# Patient Record
Sex: Female | Born: 1935
Health system: Southern US, Community
[De-identification: ages and names within clinical notes are randomized; demographics above are authoritative.]

## PROBLEM LIST (undated history)

## (undated) DIAGNOSIS — Z923 Personal history of irradiation: Secondary | ICD-10-CM

## (undated) DIAGNOSIS — J302 Other seasonal allergic rhinitis: Secondary | ICD-10-CM

## (undated) DIAGNOSIS — T7840XA Allergy, unspecified, initial encounter: Secondary | ICD-10-CM

## (undated) DIAGNOSIS — R32 Unspecified urinary incontinence: Secondary | ICD-10-CM

## (undated) DIAGNOSIS — E079 Disorder of thyroid, unspecified: Secondary | ICD-10-CM

## (undated) DIAGNOSIS — F419 Anxiety disorder, unspecified: Secondary | ICD-10-CM

## (undated) DIAGNOSIS — C50919 Malignant neoplasm of unspecified site of unspecified female breast: Secondary | ICD-10-CM

## (undated) DIAGNOSIS — L719 Rosacea, unspecified: Secondary | ICD-10-CM

## (undated) DIAGNOSIS — I10 Essential (primary) hypertension: Secondary | ICD-10-CM

## (undated) DIAGNOSIS — Z460 Encounter for fitting and adjustment of spectacles and contact lenses: Secondary | ICD-10-CM

## (undated) DIAGNOSIS — Z780 Asymptomatic menopausal state: Secondary | ICD-10-CM

## (undated) DIAGNOSIS — I499 Cardiac arrhythmia, unspecified: Secondary | ICD-10-CM

## (undated) DIAGNOSIS — I4891 Unspecified atrial fibrillation: Secondary | ICD-10-CM

## (undated) DIAGNOSIS — M1990 Unspecified osteoarthritis, unspecified site: Secondary | ICD-10-CM

## (undated) DIAGNOSIS — G479 Sleep disorder, unspecified: Secondary | ICD-10-CM

## (undated) HISTORY — PX: EYE SURGERY: SHX253

## (undated) HISTORY — DX: Unspecified atrial fibrillation: I48.91

## (undated) HISTORY — DX: Allergy, unspecified, initial encounter: T78.40XA

## (undated) HISTORY — DX: Essential (primary) hypertension: I10

## (undated) HISTORY — DX: Asymptomatic menopausal state: Z78.0

## (undated) HISTORY — DX: Cardiac arrhythmia, unspecified: I49.9

## (undated) HISTORY — DX: Disorder of thyroid, unspecified: E07.9

## (undated) HISTORY — PX: BACK SURGERY: SHX140

## (undated) HISTORY — PX: BILATERAL OOPHORECTOMY: SHX1221

## (undated) HISTORY — PX: TONSILLECTOMY: SUR1361

## (undated) HISTORY — DX: Sleep disorder, unspecified: G47.9

## (undated) HISTORY — DX: Rosacea, unspecified: L71.9

## (undated) HISTORY — DX: Unspecified urinary incontinence: R32

## (undated) HISTORY — DX: Personal history of irradiation: Z92.3

---
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
## 1971-12-04 HISTORY — PX: ABDOMINAL HYSTERECTOMY: SHX81

## 1998-02-03 ENCOUNTER — Ambulatory Visit (HOSPITAL_COMMUNITY): Admission: RE | Admit: 1998-02-03 | Discharge: 1998-02-03 | Payer: Self-pay | Admitting: Family Medicine

## 1999-02-08 ENCOUNTER — Encounter: Admission: RE | Admit: 1999-02-08 | Discharge: 1999-05-09 | Payer: Self-pay | Admitting: Family Medicine

## 1999-03-15 ENCOUNTER — Encounter: Admission: RE | Admit: 1999-03-15 | Discharge: 1999-06-13 | Payer: Self-pay | Admitting: Orthopedic Surgery

## 1999-06-08 ENCOUNTER — Ambulatory Visit (HOSPITAL_COMMUNITY): Admission: RE | Admit: 1999-06-08 | Discharge: 1999-06-08 | Payer: Self-pay | Admitting: Obstetrics and Gynecology

## 1999-06-08 ENCOUNTER — Encounter: Payer: Self-pay | Admitting: Obstetrics and Gynecology

## 2001-12-22 ENCOUNTER — Encounter: Payer: Self-pay | Admitting: Gastroenterology

## 2001-12-22 ENCOUNTER — Ambulatory Visit (HOSPITAL_COMMUNITY): Admission: RE | Admit: 2001-12-22 | Discharge: 2001-12-22 | Payer: Self-pay | Admitting: Gastroenterology

## 2003-03-16 ENCOUNTER — Encounter: Payer: Self-pay | Admitting: Internal Medicine

## 2003-03-16 ENCOUNTER — Encounter: Admission: RE | Admit: 2003-03-16 | Discharge: 2003-03-16 | Payer: Self-pay | Admitting: Internal Medicine

## 2003-03-30 ENCOUNTER — Encounter: Admission: RE | Admit: 2003-03-30 | Discharge: 2003-04-13 | Payer: Self-pay | Admitting: Internal Medicine

## 2003-05-19 ENCOUNTER — Encounter: Payer: Self-pay | Admitting: Emergency Medicine

## 2003-05-19 ENCOUNTER — Observation Stay (HOSPITAL_COMMUNITY): Admission: EM | Admit: 2003-05-19 | Discharge: 2003-05-20 | Payer: Self-pay | Admitting: Emergency Medicine

## 2003-11-25 ENCOUNTER — Emergency Department (HOSPITAL_COMMUNITY): Admission: EM | Admit: 2003-11-25 | Discharge: 2003-11-25 | Payer: Self-pay | Admitting: Emergency Medicine

## 2004-07-28 ENCOUNTER — Encounter: Admission: RE | Admit: 2004-07-28 | Discharge: 2004-07-28 | Payer: Self-pay | Admitting: Internal Medicine

## 2004-08-03 ENCOUNTER — Encounter (HOSPITAL_COMMUNITY): Admission: RE | Admit: 2004-08-03 | Discharge: 2004-11-01 | Payer: Self-pay | Admitting: Internal Medicine

## 2004-09-06 ENCOUNTER — Ambulatory Visit (HOSPITAL_COMMUNITY): Admission: RE | Admit: 2004-09-06 | Discharge: 2004-09-06 | Payer: Self-pay | Admitting: Endocrinology

## 2004-09-06 ENCOUNTER — Encounter (INDEPENDENT_AMBULATORY_CARE_PROVIDER_SITE_OTHER): Payer: Self-pay | Admitting: *Deleted

## 2005-02-06 ENCOUNTER — Encounter
Admission: RE | Admit: 2005-02-06 | Discharge: 2005-03-20 | Payer: Self-pay | Admitting: Physical Medicine and Rehabilitation

## 2005-05-22 ENCOUNTER — Inpatient Hospital Stay (HOSPITAL_COMMUNITY): Admission: RE | Admit: 2005-05-22 | Discharge: 2005-05-24 | Payer: Self-pay | Admitting: Neurosurgery

## 2005-07-04 ENCOUNTER — Encounter: Admission: RE | Admit: 2005-07-04 | Discharge: 2005-07-04 | Payer: Self-pay | Admitting: Neurosurgery

## 2005-09-05 ENCOUNTER — Encounter: Admission: RE | Admit: 2005-09-05 | Discharge: 2005-09-05 | Payer: Self-pay | Admitting: Neurosurgery

## 2005-09-11 ENCOUNTER — Encounter: Admission: RE | Admit: 2005-09-11 | Discharge: 2005-11-08 | Payer: Self-pay | Admitting: Neurosurgery

## 2005-10-05 ENCOUNTER — Encounter: Admission: RE | Admit: 2005-10-05 | Discharge: 2005-10-05 | Payer: Self-pay | Admitting: Orthopedic Surgery

## 2005-10-18 ENCOUNTER — Ambulatory Visit: Payer: Self-pay | Admitting: Infectious Diseases

## 2005-10-31 ENCOUNTER — Ambulatory Visit: Payer: Self-pay | Admitting: Infectious Diseases

## 2006-12-03 HISTORY — PX: RECTOCELE REPAIR: SHX761

## 2006-12-03 HISTORY — PX: CYSTOCELE REPAIR: SHX163

## 2007-02-11 ENCOUNTER — Ambulatory Visit (HOSPITAL_COMMUNITY): Admission: RE | Admit: 2007-02-11 | Discharge: 2007-02-12 | Payer: Self-pay | Admitting: Urology

## 2007-11-11 ENCOUNTER — Encounter: Admission: RE | Admit: 2007-11-11 | Discharge: 2007-12-03 | Payer: Self-pay | Admitting: Family Medicine

## 2007-12-08 ENCOUNTER — Encounter: Admission: RE | Admit: 2007-12-08 | Discharge: 2007-12-10 | Payer: Self-pay | Admitting: Family Medicine

## 2010-12-24 ENCOUNTER — Encounter: Payer: Self-pay | Admitting: Internal Medicine

## 2011-04-20 NOTE — Op Note (Signed)
[name], [name]               ACCOUNT NO.:  [id number]   MEDICAL RECORD NO.:  [id number]          PATIENT TYPE:  AMB   LOCATION:  DAY                          FACILITY:  [facility]   PHYSICIAN:  Martina Sinner, MD DATE OF BIRTH:  [birth year]   DATE OF PROCEDURE:  02/11/2007  DATE OF DISCHARGE:                               OPERATIVE REPORT   PREOPERATIVE DIAGNOSIS:  Vault prolapse, cystocele, rectocele.   POSTOPERATIVE DIAGNOSIS:  Vault prolapse, cystocele, rectocele.   SURGERY:  1. Vault prolapse repair  2. Cystocele repair utilizing dermal graft.  3. Rectocele repair.  4. Cystoscopy.  5. Perineal repair.   SURGEON:  Martina Sinner, MD   ASSISTANTS:  Jamison Neighbor, M.D. and Cornelious Bryant, MD   DESCRIPTION OF PROCEDURE:  Katherine Powell has a symptomatic cystocele and  distal rectocele with splaying  of the posterior fourchette.   The patient was prepped and draped in the usual fashion.  Extra care was  taken to minimize the risk of compartment syndrome, neuropathy and DVT  when I positioned her legs.  She was given preoperative antibiotics.  Her preoperative laboratory work was normal.   When I examined her there was no question that the dimples at the  vaginal cuff that could be brought down just to the introitus.  These  were marked with two 3-0 Vicryl suture.  I then made an anterior vaginal  wall incision after instilling approximately 25 mL of 1% lidocaine with  epinephrine all the way back to the lateral sidewall.  Between Allis  clamps I made my T-shape incision and sharply dissected the pubocervical  fascia from the vaginal epithelium back to the sidewall.  I then did a  two-layer anterior repair with running 2-0 Vicryl.  I then cystoscoped  the patient; and there was efflux of indigo carmine from both ureteral  orifices.  The ureteral orifices were in their normal position.   After doing the two-layer repair, I dissected sharply back to the white  line  bilaterally.  I did more sharp dissection cephalad so I could break  through the endopelvic fascia, and feel the ischial spine bilaterally.  I could feel the pelvic sidewall near the urethrovesical angle  bilaterally.  At the urethrovesical angle, we placed a 2-0 Vicryl on a  UR-5 needle strongly into the pelvic sidewall.  Using a Capio device I  placed a #0 Ethibond directly on the ischial spine.  I was very happy  with its position.  Then I took a 10 x 6 cm graft; and trimmed it in the  shape of a trapezoid in the usual fashion.  Using a Mayo needle, I  brought through all four sutures; and the graft laid in beautifully not  under tension.  I then trimmed approximately 1 cm of vaginal mucosa  bilaterally and closed with running 2-0 Vicryl nonlocking suture.  At  the end of the case the vaginal length was excellent.  One could feel  the graft as a nice large hammock underneath the bladder.  Blood loss  anteriorly was less than 10-20 mL.  I then did a posterior repair.  I placed two Allis clamps at the level  of the hymenal ring.  She had a lot of deficiency posteriorly with a  long perineum.  For this reason I made the triangle of perineal skin a  little bit larger than usual and removed the skin.  Then I made a  midline incision with my usual technique with Allis clamps as counter  traction.  There is no question that as I went up posteriorly, she had a  long posterior wall; and there was no defect in the upper third of the  vagina.  It was actually a little bit difficult to place the Allis since  there was no bulging.  I did not feel that extending the posterior  repair to the apex was in her best interest.  I, therefore, only  dissected two-thirds of the way up and took the rectovaginal fascia off  the vaginal epithelium all the way to the pelvic sidewall.  On a number  of occasions I double gloved, and did a rectal examination.  She had a  central defect with bulging of the perineal  body and distally she had  thinning of the rectovaginal mucosa with no obvious site defect.  It was  more of a generalized thinning.  A two-layer imbricating running closure  with 2-0 Vicryl was done; and there was nice reduction of the rectocele.  So I did a rectal exam.  There was no suture in the rectum, and no  distortion of the anatomy.   I trimmed 5 or 6 mm from the right side, but none from the left.  She  had minimal bleeding during the rectocele repair, but she was oozing, I  believe, from cutting the vaginal epithelium or taking things off  tension at the end of the case.  I closed with running 2-0 Vicryl  reapproximating the hymenal ring.  I then used three #0 Vicryl sutures  to gently reapproximate the perineal body.  I then brought the 2-0  Vicryl vaginal incision into the perineal incision, and did a  subcuticular closure burying the suture.  She had a little bit of  bleeding at the end, which was more of an annoyance, but I thought I  should pack her firmly.  She had a very long vagina with a lot of laxity  of her tissues, so I ended up placing in 3 vaginal packs with the third  shaped like a ball, placed distally, to give a nice gentle tension on  the area of the rectocele repair.  Leg position was good at the end of  the case.  Foley catheter was draining blue urine.   I was very pleased with Katherine Powell's surgery.  Hopefully this will reach  her treatment goal.           ______________________________  Martina Sinner, MD  Electronically Signed     SAM/MEDQ  D:  02/11/2007  T:  02/13/2007  Job:  [id number]

## 2011-04-20 NOTE — Procedures (Signed)
[facility]  Patient:    Katherine Powell, Katherine Powell Visit Number: [id number] MRN: [id number]          Service Type: END Location: ENDO Attending Physician:  Louie Bun Dictated by:   Everardo All Madilyn Fireman, M.D. Proc. Date: 12/22/01 Admit Date:  12/22/2001   CC:         Caryn Bee C. Sydnee Levans, M.D.   Procedure Report  PROCEDURE:  Colonoscopy.  INDICATION FOR PROCEDURE:  Screening colonoscopy in a 77 year old patient who has also had stable chronic right lower quadrant abdominal pain.  DESCRIPTION OF PROCEDURE:  The patient was placed in the left lateral decubitus position and placed on the pulse monitor with continuous low flow oxygen delivered by nasal cannula. She was sedated with 80 mg IV Demerol and 9 mg IV Versed. The Olympus video colonoscope was inserted into the rectum and inserted its entire length but despite multiple position changes, torquing maneuvers, abdominal pressure and the use of the variable stiffness scope at different settings, I was unable to reach the cecum. It was not certain but felt that the point of furthest penetration was probably somewhere in the proximal transverse colon. The visualized parts of the transverse colon as well as descending colon appeared normal with no masses, polyps, diverticula or other mucosal abnormalities. Within the sigmoid colon, there were seen a few scattered diverticula and no other abnormalities. The rectum appeared normal and retroflexed view of the anus revealed no obvious internal hemorrhoids. The colonoscope was then withdrawn and the patient returned to the recovery room in stable condition. The patient tolerated the procedure well and there were no immediate complications.  IMPRESSION:  1. Incomplete studies about the mid to proximal transverse colon.  2. Sigmoid diverticulosis.  PLAN:  Will obtain barium enema later today to image the right colon. Dictated by:   Everardo All Madilyn Fireman, M.D. Attending  Physician:  Louie Bun DD:  12/22/01 TD:  12/23/01 Job: [id number]

## 2011-12-04 HISTORY — PX: CATARACT EXTRACTION: SUR2

## 2013-08-25 ENCOUNTER — Other Ambulatory Visit: Payer: Self-pay | Admitting: Ophthalmology

## 2013-09-03 ENCOUNTER — Encounter (HOSPITAL_COMMUNITY): Payer: Self-pay

## 2013-09-08 ENCOUNTER — Ambulatory Visit (INDEPENDENT_AMBULATORY_CARE_PROVIDER_SITE_OTHER): Payer: Medicare Other | Admitting: Interventional Cardiology

## 2013-09-08 ENCOUNTER — Ambulatory Visit: Payer: Medicare Other | Admitting: Interventional Cardiology

## 2013-09-08 ENCOUNTER — Ambulatory Visit (HOSPITAL_COMMUNITY): Payer: Medicare Other | Attending: Cardiology | Admitting: Radiology

## 2013-09-08 ENCOUNTER — Other Ambulatory Visit (HOSPITAL_COMMUNITY): Payer: Self-pay | Admitting: Interventional Cardiology

## 2013-09-08 DIAGNOSIS — I4891 Unspecified atrial fibrillation: Secondary | ICD-10-CM | POA: Insufficient documentation

## 2013-09-08 DIAGNOSIS — I379 Nonrheumatic pulmonary valve disorder, unspecified: Secondary | ICD-10-CM | POA: Insufficient documentation

## 2013-09-08 DIAGNOSIS — Z79899 Other long term (current) drug therapy: Secondary | ICD-10-CM

## 2013-09-08 DIAGNOSIS — I079 Rheumatic tricuspid valve disease, unspecified: Secondary | ICD-10-CM | POA: Insufficient documentation

## 2013-09-08 DIAGNOSIS — I080 Rheumatic disorders of both mitral and aortic valves: Secondary | ICD-10-CM | POA: Insufficient documentation

## 2013-09-08 NOTE — Progress Notes (Deleted)
   Patient ID: Katherine Powell, female    DOB: 1932-08-03, 79 y.o.   MRN: [id number]  HPI    Review of Systems    Physical Exam

## 2013-09-08 NOTE — Progress Notes (Deleted)
   Patient ID: Katherine Powell, female    DOB: 05/19/1932, 79 y.o.   MRN: [id number]  HPI    Review of Systems    Physical Exam      

## 2013-09-08 NOTE — Progress Notes (Signed)
Echocardiogram performed.  

## 2013-09-08 NOTE — Progress Notes (Signed)
Patient ID: Katherine Powell, female   DOB: January 19, 1932, 79 y.o.   MRN: [id number]   EXERCISE TREADMILL TEST  INDICATION: Initiation of flecainide therapy for atrial fibrillation.  PROTOCOL: Bruce protocol  RESULTS: The patient exercised 6 minutes 33 seconds on the standard Bruce protocol. Resting blood pressure was normal at 128/70 mmHg. Heart rate increased to 122 beats per minute, which exceeded 85% of the predicted maximum heart rate. Peak blood pressure 178/60. There were no symptoms of angina, EKG changes of ischemia, or arrhythmias noted. Recovery was uneventful.  IMPRESSION: 1. No exercise-induced arrhythmias.  2. No evidence of ischemia.  3. Normal. Exercise tolerance

## 2013-09-08 NOTE — Progress Notes (Signed)
Exercise Treadmill Test  Pre-Exercise Testing Evaluation Rhythm: normal sinus  Rate: 53                 Test  Exercise Tolerance Test Ordering MD: Verdis Prime III, MD  Interpreting MD: Verdis Prime III, MD  Unique Test No: 1  Treadmill:  1  Indication for ETT: A FIB  Contraindication to ETT: No   Stress Modality: exercise - treadmill  Cardiac Imaging Performed: non   Protocol: standard Bruce - maximal  Max BP:  164/75  Max MPHR (bpm):  141 85% MPR (bpm):  120  MPHR obtained (bpm):  123 % MPHR obtained:  87  Reached 85% MPHR (min:sec):  6:24 Total Exercise Time (min-sec):  6:33  Workload in METS:  7.8 Borg Scale: 13  Reason ETT Terminated:  Target heart rate achieved    ST Segment Analysis At Rest: normal ST segments - no evidence of significant ST depression With Exercise: no evidence of significant ST depression  Other Information Arrhythmia:  No Angina during ETT:  absent (0) Quality of ETT:  diagnostic  ETT Interpretation:  normal - no evidence of ischemia by ST analysis  Comments: The treadmill test was performed after initiation of flecainide therapy. No exercise-induced arrhythmias were noted. Coincidentally, no evidence of ischemia was induced.  Recommendations: Continue therapy

## 2013-09-11 ENCOUNTER — Telehealth: Payer: Self-pay

## 2013-09-11 NOTE — Telephone Encounter (Signed)
Message copied by Jarvis Newcomer on Fri Sep 11, 2013  8:40 AM ------      Message from: Verdis Prime      Created: Wed Sep 09, 2013 12:50 PM       The echo was normal. The stress test was acceptable. We should continue the flecainide at the current dose. Needs appointment for her to see me in 3 months. She should call if symptoms become intolerable appear ------

## 2013-09-11 NOTE — Telephone Encounter (Signed)
lmom. The echo was normal. The stress test was acceptable. We should continue the flecainide at the current dose. Needs appointment for her to see me in 3 months. She should call if symptoms become intolerable.  Recall put  in for 3 mo f/u

## 2013-09-11 NOTE — Telephone Encounter (Signed)
Message copied by Jarvis Newcomer on Fri Sep 11, 2013  8:39 AM ------      Message from: Verdis Prime      Created: Wed Sep 09, 2013 12:50 PM       The echo was normal. The stress test was acceptable. We should continue the flecainide at the current dose. Needs appointment for her to see me in 3 months. She should call if symptoms become intolerable appear ------

## 2013-09-17 ENCOUNTER — Telehealth: Payer: Self-pay | Admitting: Interventional Cardiology

## 2013-09-17 NOTE — Telephone Encounter (Signed)
lmom for pt to return call. Dr.Smith instructs that the pt hold flecainide for 1 week if symptoms continue then resume taking.if symptoms subside, pt needs to know the only other alternative to controlling her afib is to be hospitalized for upload of tykosin.pt needs to determine whether she would be willing to tolerate symptoms

## 2013-09-17 NOTE — Telephone Encounter (Signed)
New message    On flecainide----causing dizziness, headache and causing her to be sleepy a lot

## 2013-09-18 ENCOUNTER — Telehealth: Payer: Self-pay | Admitting: Interventional Cardiology

## 2013-09-18 NOTE — Telephone Encounter (Signed)
Patient returning Lisa's call. Patient was told to speak with Triage.

## 2013-09-18 NOTE — Telephone Encounter (Signed)
I spoke with the pt and made her aware of Dr Michaelle Copas recommendations.  The pt will hold flecainide for one week to see if her symptoms change. The pt said she will think about whether she would like to start Tikosyn. The pt will call the office in one week.  I will forward this message to Misty Stanley to also contact the pt in 1 week.

## 2013-10-06 ENCOUNTER — Encounter: Payer: Self-pay | Admitting: Interventional Cardiology

## 2013-10-22 ENCOUNTER — Telehealth: Payer: Self-pay | Admitting: Interventional Cardiology

## 2013-10-22 NOTE — Telephone Encounter (Signed)
New problem    Patient need to stop coumadin 5 days prior to breast biopsy schedule on 12/2 .

## 2013-10-23 NOTE — Telephone Encounter (Signed)
LM for Shanda Bumps at [facility] and pt with Dr. Michaelle Copas instructions

## 2013-10-23 NOTE — Telephone Encounter (Signed)
This is okay and should be coordinated with the coumadin clinic.

## 2013-10-26 ENCOUNTER — Encounter (INDEPENDENT_AMBULATORY_CARE_PROVIDER_SITE_OTHER): Payer: Self-pay | Admitting: Surgery

## 2013-11-02 NOTE — Telephone Encounter (Signed)
Spoke with pt, she states she has cancelled the breast bx for 11/03/13.  Going for a second opinion first.  Pt WCB if and when reschedules bx for instructions on holding Coumadin.

## 2013-11-05 ENCOUNTER — Ambulatory Visit (INDEPENDENT_AMBULATORY_CARE_PROVIDER_SITE_OTHER): Payer: Medicare Other | Admitting: Surgery

## 2013-11-05 ENCOUNTER — Encounter (INDEPENDENT_AMBULATORY_CARE_PROVIDER_SITE_OTHER): Payer: Self-pay | Admitting: Surgery

## 2013-11-05 ENCOUNTER — Encounter (HOSPITAL_BASED_OUTPATIENT_CLINIC_OR_DEPARTMENT_OTHER): Payer: Self-pay | Admitting: *Deleted

## 2013-11-05 DIAGNOSIS — N6310 Unspecified lump in the right breast, unspecified quadrant: Secondary | ICD-10-CM | POA: Insufficient documentation

## 2013-11-05 DIAGNOSIS — N63 Unspecified lump in unspecified breast: Secondary | ICD-10-CM

## 2013-11-05 NOTE — Progress Notes (Signed)
Patient ID: Katherine Powell, female   DOB: 11-May-1932, 79 y.o.   MRN: [id number]  Chief Complaint  Patient presents with  . New Evaluation    eval breast mass    HPI Katherine Powell is a 79 y.o. female.   HPI She is referred by Dr. Duane Lope for evaluation of a right breast mass. This was found on screening mammography. She refused stereotactic biopsy and what to go ahead and proceed with removal of the mass. She cannot palpate the mass herself. She has no previous problems with her breasts. She denies nipple discharge. She is otherwise without complaints and his physically been doing well Past Medical History  Diagnosis Date  . Arrhythmia     atrial fibrillation/  on   . Hypertension   . Rosacea   . Atrial fibrillation   . Postmenopausal   . Sleep disturbance   . Thyroid disease   . Urinary incontinence     Past Surgical History  Procedure Laterality Date  . Abdominal hysterectomy    . Cystocele repair    . Rectocele repair    . Back surgery    . Appendectomy    . Tonsillectomy    . Cataract extraction    . Eye surgery      History reviewed. No pertinent family history.  Social History History  Substance Use Topics  . Smoking status: Never Smoker   . Smokeless tobacco: Not on file  . Alcohol Use: No    Allergies  Allergen Reactions  . Codeine     nausea    Current Outpatient Prescriptions  Medication Sig Dispense Refill  . acetaminophen (TYLENOL) 650 MG CR tablet Take 650 mg by mouth every 8 (eight) hours as needed for pain.      [name] ALPRAZolam (XANAX) 0.5 MG tablet Take 0.5 mg by mouth at bedtime as needed for anxiety.      . Azelaic Acid (FINACEA) 15 % cream Apply topically 2 (two) times daily. After skin is thoroughly washed and patted dry, gently but thoroughly massage a thin film of azelaic acid cream into the affected area twice daily, in the morning and evening.      . beta carotene w/minerals (OCUVITE) tablet Take 1 tablet by mouth daily.      . calcium  carbonate (OS-CAL) 600 MG TABS tablet Take 600 mg by mouth 2 (two) times daily with a meal.      . cycloSPORINE (RESTASIS) 0.05 % ophthalmic emulsion 1 drop 2 (two) times daily.      [name] diltiazem (CARDIZEM SR) 120 MG 12 hr capsule Take 120 mg by mouth 2 (two) times daily.      [name] estradiol (ESTRACE) 0.5 MG tablet       . fexofenadine (ALLEGRA) 180 MG tablet Take 180 mg by mouth daily.      . fluticasone (FLONASE) 50 MCG/ACT nasal spray Place into both nostrils daily.      [name] glucosamine-chondroitin 500-400 MG tablet Take 1 tablet by mouth 3 (three) times daily.      [name] guaifenesin (HUMIBID E) 400 MG TABS tablet Take 400 mg by mouth every 4 (four) hours.      [name] guaiFENesin (MUCINEX) 600 MG 12 hr tablet Take by mouth 2 (two) times daily.      . Melatonin 10 MG TABS Take by mouth.      . METRONIDAZOLE EX Apply topically.      . sodium chloride (OCEAN) 0.65 % nasal spray Place  1 spray into the nose as needed for congestion.      . trolamine salicylate (ASPERCREME) 10 % cream Apply 1 application topically as needed for muscle pain.      . vitamin E 400 UNIT capsule Take 400 Units by mouth daily.      [name] warfarin (COUMADIN) 5 MG tablet       . Coenzyme Q10 (CO Q-10) 300 MG CAPS Take by mouth.      . flecainide (TAMBOCOR) 50 MG tablet       . magnesium oxide (MAG-OX) 400 MG tablet Take 400 mg by mouth daily.      . Potassium Gluconate 595 MG TBCR Take by mouth.       No current facility-administered medications for this visit.    Review of Systems Review of Systems  Constitutional: Negative for fever, chills and unexpected weight change.  HENT: Negative for congestion, hearing loss, sore throat, trouble swallowing and voice change.   Eyes: Negative for visual disturbance.  Respiratory: Negative for cough and wheezing.   Cardiovascular: Negative for chest pain, palpitations and leg swelling.  Gastrointestinal: Negative for nausea, vomiting, abdominal pain, diarrhea, constipation, blood in stool,  abdominal distention and anal bleeding.  Genitourinary: Negative for hematuria, vaginal bleeding and difficulty urinating.  Musculoskeletal: Positive for arthralgias.  Skin: Negative for rash and wound.  Neurological: Negative for seizures, syncope and headaches.  Hematological: Negative for adenopathy. Does not bruise/bleed easily.  Psychiatric/Behavioral: Negative for confusion.    There were no vitals taken for this visit.  Physical Exam Physical Exam  Constitutional: She is oriented to person, place, and time. She appears well-developed and well-nourished. No distress.  HENT:  Head: Normocephalic and atraumatic.  Right Ear: External ear normal.  Left Ear: External ear normal.  Nose: Nose normal.  Mouth/Throat: Oropharynx is clear and moist. No oropharyngeal exudate.  Eyes: Conjunctivae are normal. Pupils are equal, round, and reactive to light. Right eye exhibits no discharge. Left eye exhibits no discharge. No scleral icterus.  Neck: Normal range of motion. Neck supple. No tracheal deviation present.  Cardiovascular: Normal rate, regular rhythm, normal heart sounds and intact distal pulses.   No murmur heard. Pulmonary/Chest: Effort normal and breath sounds normal. No respiratory distress. She has no wheezes. She has no rales.  Musculoskeletal: Normal range of motion. She exhibits no edema and no tenderness.  Lymphadenopathy:    She has no cervical adenopathy.    She has axillary adenopathy.  There is a slightly enlarged right axillary lymph node  Neurological: She is alert and oriented to person, place, and time.  Skin: Skin is warm and dry. No rash noted. She is not diaphoretic. No erythema.  Psychiatric: Her behavior is normal. Judgment normal.  Breasts: I cannot palpate a mass in the right breast specifically at the area of concern. The areola and nipple are normal  Data Reviewed Her mammogram demonstrates a 1 cm mass at the 12:00 position of the right breast with  indistinct margins  Assessment    Right breast mass     Plan    The mammogram is worrisome for potential malignancy. I discussed the reasons for a stereotactic biopsy. She just wants to go ahead and have the mass removed which I believe is reasonable. She understands that further surgery may be necessary if malignancy is found. She also understands this may be a benign lesion. This would have to be done with a needle localization. I discussed the risks of surgery which includes  but is not limited to bleeding, infection, injury to surround structures, need for further surgery should the margins be positive, cardiopulmonary issues given her age, Melvern Banker. She will have to stop her Coumadin 5 days preoperatively. Surgery will be scheduled        Normand Damron A 11/05/2013, 3:23 PM

## 2013-11-05 NOTE — Progress Notes (Signed)
Pt just added on today-to stop coumadin tonight-surgery is Monday-will need to come here 1200 noon for bmet-pt ptt-then [facility] 1230-then back here for surgery Had cardiac work up dr Katrinka Blazing 10/14-stress-echo-ekg-cleared for surgery No resp problems-occ allergies

## 2013-11-09 ENCOUNTER — Encounter (HOSPITAL_BASED_OUTPATIENT_CLINIC_OR_DEPARTMENT_OTHER): Payer: Self-pay

## 2013-11-09 ENCOUNTER — Ambulatory Visit (HOSPITAL_BASED_OUTPATIENT_CLINIC_OR_DEPARTMENT_OTHER): Payer: Medicare Other | Admitting: Anesthesiology

## 2013-11-09 ENCOUNTER — Ambulatory Visit (HOSPITAL_BASED_OUTPATIENT_CLINIC_OR_DEPARTMENT_OTHER)
Admission: RE | Admit: 2013-11-09 | Discharge: 2013-11-09 | Disposition: A | Discharge status: Home / Self Care | Payer: Medicare Other | Source: Ambulatory Visit | Attending: Surgery | Admitting: Surgery

## 2013-11-09 ENCOUNTER — Encounter (HOSPITAL_BASED_OUTPATIENT_CLINIC_OR_DEPARTMENT_OTHER): Payer: Medicare Other | Admitting: Anesthesiology

## 2013-11-09 ENCOUNTER — Encounter (HOSPITAL_BASED_OUTPATIENT_CLINIC_OR_DEPARTMENT_OTHER)
Admission: RE | Disposition: A | Discharge status: Home / Self Care | Payer: Self-pay | Source: Ambulatory Visit | Attending: Surgery

## 2013-11-09 DIAGNOSIS — Z7901 Long term (current) use of anticoagulants: Secondary | ICD-10-CM | POA: Insufficient documentation

## 2013-11-09 DIAGNOSIS — I10 Essential (primary) hypertension: Secondary | ICD-10-CM | POA: Insufficient documentation

## 2013-11-09 DIAGNOSIS — Z79899 Other long term (current) drug therapy: Secondary | ICD-10-CM | POA: Insufficient documentation

## 2013-11-09 DIAGNOSIS — E669 Obesity, unspecified: Secondary | ICD-10-CM | POA: Insufficient documentation

## 2013-11-09 DIAGNOSIS — C50919 Malignant neoplasm of unspecified site of unspecified female breast: Secondary | ICD-10-CM | POA: Insufficient documentation

## 2013-11-09 DIAGNOSIS — N6310 Unspecified lump in the right breast, unspecified quadrant: Secondary | ICD-10-CM

## 2013-11-09 DIAGNOSIS — Z01812 Encounter for preprocedural laboratory examination: Secondary | ICD-10-CM | POA: Insufficient documentation

## 2013-11-09 DIAGNOSIS — D0590 Unspecified type of carcinoma in situ of unspecified breast: Secondary | ICD-10-CM

## 2013-11-09 DIAGNOSIS — I4891 Unspecified atrial fibrillation: Secondary | ICD-10-CM | POA: Insufficient documentation

## 2013-11-09 DIAGNOSIS — Z780 Asymptomatic menopausal state: Secondary | ICD-10-CM | POA: Insufficient documentation

## 2013-11-09 DIAGNOSIS — E079 Disorder of thyroid, unspecified: Secondary | ICD-10-CM | POA: Insufficient documentation

## 2013-11-09 DIAGNOSIS — L719 Rosacea, unspecified: Secondary | ICD-10-CM | POA: Insufficient documentation

## 2013-11-09 DIAGNOSIS — R32 Unspecified urinary incontinence: Secondary | ICD-10-CM | POA: Insufficient documentation

## 2013-11-09 HISTORY — DX: Encounter for fitting and adjustment of spectacles and contact lenses: Z46.0

## 2013-11-09 HISTORY — DX: Malignant neoplasm of unspecified site of unspecified female breast: C50.919

## 2013-11-09 HISTORY — DX: Other seasonal allergic rhinitis: J30.2

## 2013-11-09 HISTORY — PX: BREAST LUMPECTOMY WITH NEEDLE LOCALIZATION: SHX5759

## 2013-11-09 LAB — BASIC METABOLIC PANEL
BUN: 14 mg/dL (ref 6–23)
CO2: 28 mEq/L (ref 19–32)
Calcium: 8.9 mg/dL (ref 8.4–10.5)
Chloride: 104 mEq/L (ref 96–112)
Creatinine, Ser: 0.89 mg/dL (ref 0.50–1.10)
GFR calc Af Amer: 70 mL/min — ABNORMAL LOW (ref >90)
GFR calc non Af Amer: 60 mL/min — ABNORMAL LOW (ref >90)
Glucose, Bld: 86 mg/dL (ref 70–99)
Potassium: 3.5 mEq/L (ref 3.5–5.1)
Sodium: 139 mEq/L (ref 135–145)

## 2013-11-09 LAB — APTT: aPTT: 29 seconds (ref 24–37)

## 2013-11-09 LAB — PROTIME-INR
INR: 1.05 (ref 0.00–1.49)
Prothrombin Time: 13.5 seconds (ref 11.6–15.2)

## 2013-11-09 LAB — POCT HEMOGLOBIN-HEMACUE: Hemoglobin: 12.6 g/dL (ref 12.0–15.0)

## 2013-11-09 SURGERY — BREAST LUMPECTOMY WITH NEEDLE LOCALIZATION
Anesthesia: General | Laterality: Right

## 2013-11-09 MED ORDER — HYDROCODONE-ACETAMINOPHEN 5-325 MG PO TABS: 1.0000 | ORAL_TABLET | ORAL | Status: DC | PRN

## 2013-11-09 MED ORDER — ONDANSETRON HCL 4 MG/2ML IJ SOLN: 4.0000 mg | Freq: Four times a day (QID) | INTRAMUSCULAR | Status: DC | PRN

## 2013-11-09 MED ORDER — PROPOFOL 10 MG/ML IV BOLUS
INTRAVENOUS | Status: DC | PRN
  Administered 2013-11-09: 100 mg via INTRAVENOUS

## 2013-11-09 MED ORDER — MORPHINE SULFATE 2 MG/ML IJ SOLN: 1.0000 mg | INTRAMUSCULAR | Status: DC | PRN

## 2013-11-09 MED ORDER — PROPOFOL 10 MG/ML IV BOLUS
INTRAVENOUS | Status: AC
  Filled 2013-11-09: qty 20

## 2013-11-09 MED ORDER — HYDROMORPHONE HCL PF 1 MG/ML IJ SOLN
0.2500 mg | INTRAMUSCULAR | Status: DC | PRN
  Administered 2013-11-09 (×2): 0.25 mg via INTRAVENOUS

## 2013-11-09 MED ORDER — FENTANYL CITRATE 0.05 MG/ML IJ SOLN
INTRAMUSCULAR | Status: DC | PRN
  Administered 2013-11-09: 100 ug via INTRAVENOUS

## 2013-11-09 MED ORDER — ONDANSETRON HCL 4 MG/2ML IJ SOLN
INTRAMUSCULAR | Status: DC | PRN
  Administered 2013-11-09: 4 mg via INTRAVENOUS

## 2013-11-09 MED ORDER — HYDROMORPHONE HCL PF 1 MG/ML IJ SOLN
INTRAMUSCULAR | Status: AC
  Filled 2013-11-09: qty 1

## 2013-11-09 MED ORDER — SODIUM BICARBONATE 4 % IV SOLN
INTRAVENOUS | Status: AC
  Filled 2013-11-09: qty 5

## 2013-11-09 MED ORDER — ACETAMINOPHEN 650 MG RE SUPP: 650.0000 mg | RECTAL | Status: DC | PRN

## 2013-11-09 MED ORDER — SODIUM CHLORIDE 0.9 % IV SOLN: 250.0000 mL | INTRAVENOUS | Status: DC | PRN

## 2013-11-09 MED ORDER — LACTATED RINGERS IV SOLN
INTRAVENOUS | Status: DC
  Administered 2013-11-09: 15:00:00 via INTRAVENOUS
  Administered 2013-11-09: 20 mL/h via INTRAVENOUS

## 2013-11-09 MED ORDER — LIDOCAINE HCL (CARDIAC) 20 MG/ML IV SOLN
INTRAVENOUS | Status: DC | PRN
  Administered 2013-11-09: 60 mg via INTRAVENOUS

## 2013-11-09 MED ORDER — ACETAMINOPHEN 325 MG PO TABS: 650.0000 mg | ORAL_TABLET | ORAL | Status: DC | PRN

## 2013-11-09 MED ORDER — BUPIVACAINE-EPINEPHRINE PF 0.5-1:200000 % IJ SOLN
INTRAMUSCULAR | Status: AC
  Filled 2013-11-09: qty 30

## 2013-11-09 MED ORDER — BUPIVACAINE-EPINEPHRINE 0.5% -1:200000 IJ SOLN
INTRAMUSCULAR | Status: DC | PRN
  Administered 2013-11-09: 10 mL

## 2013-11-09 MED ORDER — SODIUM CHLORIDE 0.9 % IJ SOLN: 3.0000 mL | Freq: Two times a day (BID) | INTRAMUSCULAR | Status: DC

## 2013-11-09 MED ORDER — LIDOCAINE HCL (PF) 1 % IJ SOLN
INTRAMUSCULAR | Status: AC
  Filled 2013-11-09: qty 30

## 2013-11-09 MED ORDER — MIDAZOLAM HCL 2 MG/2ML IJ SOLN
INTRAMUSCULAR | Status: AC
  Filled 2013-11-09: qty 2

## 2013-11-09 MED ORDER — HYDROMORPHONE HCL PF 1 MG/ML IJ SOLN: 0.2500 mg | INTRAMUSCULAR | Status: DC | PRN

## 2013-11-09 MED ORDER — SODIUM CHLORIDE 0.9 % IJ SOLN: 3.0000 mL | INTRAMUSCULAR | Status: DC | PRN

## 2013-11-09 MED ORDER — ONDANSETRON HCL 4 MG/2ML IJ SOLN: 4.0000 mg | Freq: Once | INTRAMUSCULAR | Status: DC | PRN

## 2013-11-09 MED ORDER — EPHEDRINE SULFATE 50 MG/ML IJ SOLN
INTRAMUSCULAR | Status: DC | PRN
  Administered 2013-11-09: 10 mg via INTRAVENOUS

## 2013-11-09 MED ORDER — OXYCODONE HCL 5 MG PO TABS: 5.0000 mg | ORAL_TABLET | ORAL | Status: DC | PRN

## 2013-11-09 MED ORDER — FENTANYL CITRATE 0.05 MG/ML IJ SOLN
INTRAMUSCULAR | Status: AC
  Filled 2013-11-09: qty 4

## 2013-11-09 MED ORDER — CEFAZOLIN SODIUM-DEXTROSE 2-3 GM-% IV SOLR
2.0000 g | INTRAVENOUS | Status: AC
  Administered 2013-11-09: 2 g via INTRAVENOUS

## 2013-11-09 SURGICAL SUPPLY — 42 items
APL SKNCLS STERI-STRIP NONHPOA (GAUZE/BANDAGES/DRESSINGS) ×1
BENZOIN TINCTURE PRP APPL 2/3 (GAUZE/BANDAGES/DRESSINGS) ×2 IMPLANT
BLADE HEX COATED 2.75 (ELECTRODE) ×2 IMPLANT
BLADE SURG 15 STRL LF DISP TIS (BLADE) ×1 IMPLANT
BLADE SURG 15 STRL SS (BLADE) ×2
CANISTER SUCT 1200ML W/VALVE (MISCELLANEOUS) IMPLANT
CHLORAPREP W/TINT 26ML (MISCELLANEOUS) ×2 IMPLANT
CLIP TI WIDE RED SMALL 6 (CLIP) ×1 IMPLANT
COVER MAYO STAND STRL (DRAPES) ×2 IMPLANT
COVER TABLE BACK 60X90 (DRAPES) ×2 IMPLANT
DECANTER SPIKE VIAL GLASS SM (MISCELLANEOUS) IMPLANT
DEVICE DUBIN W/COMP PLATE 8390 (MISCELLANEOUS) ×1 IMPLANT
DRAPE PED LAPAROTOMY (DRAPES) ×2 IMPLANT
DRAPE UTILITY XL STRL (DRAPES) ×2 IMPLANT
DRSG TEGADERM 4X4.75 (GAUZE/BANDAGES/DRESSINGS) ×2 IMPLANT
ELECT REM PT RETURN 9FT ADLT (ELECTROSURGICAL) ×2
ELECTRODE REM PT RTRN 9FT ADLT (ELECTROSURGICAL) ×1 IMPLANT
GAUZE SPONGE 4X4 12PLY STRL LF (GAUZE/BANDAGES/DRESSINGS) ×2 IMPLANT
GLOVE BIOGEL M STRL SZ7.5 (GLOVE) ×1 IMPLANT
GLOVE BIOGEL PI IND STRL 8 (GLOVE) IMPLANT
GLOVE BIOGEL PI INDICATOR 8 (GLOVE) ×1
GLOVE SURG SIGNA 7.5 PF LTX (GLOVE) ×2 IMPLANT
GOWN PREVENTION PLUS XLARGE (GOWN DISPOSABLE) ×1 IMPLANT
GOWN PREVENTION PLUS XXLARGE (GOWN DISPOSABLE) ×3 IMPLANT
KIT MARKER MARGIN INK (KITS) ×2 IMPLANT
NDL HYPO 25X1 1.5 SAFETY (NEEDLE) ×1 IMPLANT
NEEDLE HYPO 25X1 1.5 SAFETY (NEEDLE) ×2 IMPLANT
NS IRRIG 1000ML POUR BTL (IV SOLUTION) ×2 IMPLANT
PACK BASIN DAY SURGERY FS (CUSTOM PROCEDURE TRAY) ×2 IMPLANT
PENCIL BUTTON HOLSTER BLD 10FT (ELECTRODE) ×2 IMPLANT
SLEEVE SCD COMPRESS KNEE MED (MISCELLANEOUS) IMPLANT
SPONGE LAP 4X18 X RAY DECT (DISPOSABLE) ×3 IMPLANT
STRIP CLOSURE SKIN 1/2X4 (GAUZE/BANDAGES/DRESSINGS) ×2 IMPLANT
SUT MNCRL AB 4-0 PS2 18 (SUTURE) ×2 IMPLANT
SUT SILK 2 0 SH (SUTURE) ×2 IMPLANT
SUT VIC AB 3-0 SH 27 (SUTURE) ×2
SUT VIC AB 3-0 SH 27X BRD (SUTURE) ×1 IMPLANT
SYR CONTROL 10ML LL (SYRINGE) ×2 IMPLANT
TOWEL OR 17X24 6PK STRL BLUE (TOWEL DISPOSABLE) ×2 IMPLANT
TOWEL OR NON WOVEN STRL DISP B (DISPOSABLE) ×2 IMPLANT
TUBE CONNECTING 20X1/4 (TUBING) IMPLANT
YANKAUER SUCT BULB TIP NO VENT (SUCTIONS) IMPLANT

## 2013-11-09 NOTE — H&P (Signed)
Patient ID: Katherine Powell, female DOB: [birth year], 79 y.o. MRN: [id number]  Chief Complaint   Patient presents with   .  New Evaluation     eval breast mass   HPI  Katherine Powell is a 79 y.o. female.  HPI  She is referred by Katherine Powell for evaluation of a right breast mass. This was found on screening mammography. She refused stereotactic biopsy and what to go ahead and proceed with removal of the mass. She cannot palpate the mass herself. She has no previous problems with her breasts. She denies nipple discharge. She is otherwise without complaints and his physically been doing well  Past Medical History   Diagnosis  Date   .  Arrhythmia      atrial fibrillation/ on   .  Hypertension    .  Rosacea    .  Atrial fibrillation    .  Postmenopausal    .  Sleep disturbance    .  Thyroid disease    .  Urinary incontinence     Past Surgical History   Procedure  Laterality  Date   .  Abdominal hysterectomy     .  Cystocele repair     .  Rectocele repair     .  Back surgery     .  Appendectomy     .  Tonsillectomy     .  Cataract extraction     .  Eye surgery     History reviewed. No pertinent family history.  Social History  History   Substance Use Topics   .  Smoking status:  Never Smoker   .  Smokeless tobacco:  Not on file   .  Alcohol Use:  No    Allergies   Allergen  Reactions   .  Codeine      nausea    Current Outpatient Prescriptions   Medication  Sig  Dispense  Refill   .  acetaminophen (TYLENOL) 650 MG CR tablet  Take 650 mg by mouth every 8 (eight) hours as needed for pain.     [name]  ALPRAZolam (XANAX) 0.5 MG tablet  Take 0.5 mg by mouth at bedtime as needed for anxiety.     .  Azelaic Acid (FINACEA) 15 % cream  Apply topically 2 (two) times daily. After skin is thoroughly washed and patted dry, gently but thoroughly massage a thin film of azelaic acid cream into the affected area twice daily, in the morning and evening.     .  beta carotene w/minerals (OCUVITE)  tablet  Take 1 tablet by mouth daily.     .  calcium carbonate (OS-CAL) 600 MG TABS tablet  Take 600 mg by mouth 2 (two) times daily with a meal.     .  cycloSPORINE (RESTASIS) 0.05 % ophthalmic emulsion  1 drop 2 (two) times daily.     [name]  diltiazem (CARDIZEM SR) 120 MG 12 hr capsule  Take 120 mg by mouth 2 (two) times daily.     [name]  estradiol (ESTRACE) 0.5 MG tablet      .  fexofenadine (ALLEGRA) 180 MG tablet  Take 180 mg by mouth daily.     .  fluticasone (FLONASE) 50 MCG/ACT nasal spray  Place into both nostrils daily.     [name]  glucosamine-chondroitin 500-400 MG tablet  Take 1 tablet by mouth 3 (three) times daily.     [name]  guaifenesin (HUMIBID E) 400 MG TABS  tablet  Take 400 mg by mouth every 4 (four) hours.     [name]  guaiFENesin (MUCINEX) 600 MG 12 hr tablet  Take by mouth 2 (two) times daily.     .  Melatonin 10 MG TABS  Take by mouth.     .  METRONIDAZOLE EX  Apply topically.     .  sodium chloride (OCEAN) 0.65 % nasal spray  Place 1 spray into the nose as needed for congestion.     .  trolamine salicylate (ASPERCREME) 10 % cream  Apply 1 application topically as needed for muscle pain.     .  vitamin E 400 UNIT capsule  Take 400 Units by mouth daily.     [name]  warfarin (COUMADIN) 5 MG tablet      .  Coenzyme Q10 (CO Q-10) 300 MG CAPS  Take by mouth.     .  flecainide (TAMBOCOR) 50 MG tablet      .  magnesium oxide (MAG-OX) 400 MG tablet  Take 400 mg by mouth daily.     .  Potassium Gluconate 595 MG TBCR  Take by mouth.      No current facility-administered medications for this visit.   Review of Systems  Review of Systems  Constitutional: Negative for fever, chills and unexpected weight change.  HENT: Negative for congestion, hearing loss, sore throat, trouble swallowing and voice change.  Eyes: Negative for visual disturbance.  Respiratory: Negative for cough and wheezing.  Cardiovascular: Negative for chest pain, palpitations and leg swelling.  Gastrointestinal: Negative for nausea,  vomiting, abdominal pain, diarrhea, constipation, blood in stool, abdominal distention and anal bleeding.  Genitourinary: Negative for hematuria, vaginal bleeding and difficulty urinating.  Musculoskeletal: Positive for arthralgias.  Skin: Negative for rash and wound.  Neurological: Negative for seizures, syncope and headaches.  Hematological: Negative for adenopathy. Does not bruise/bleed easily.  Psychiatric/Behavioral: Negative for confusion.  There were no vitals taken for this visit.  Physical Exam  Physical Exam  Constitutional: She is oriented to person, place, and time. She appears well-developed and well-nourished. No distress.  HENT:  Head: Normocephalic and atraumatic.  Right Ear: External ear normal.  Left Ear: External ear normal.  Nose: Nose normal.  Mouth/Throat: Oropharynx is clear and moist. No oropharyngeal exudate.  Eyes: Conjunctivae are normal. Pupils are equal, round, and reactive to light. Right eye exhibits no discharge. Left eye exhibits no discharge. No scleral icterus.  Neck: Normal range of motion. Neck supple. No tracheal deviation present.  Cardiovascular: Normal rate, regular rhythm, normal heart sounds and intact distal pulses.  No murmur heard.  Pulmonary/Chest: Effort normal and breath sounds normal. No respiratory distress. She has no wheezes. She has no rales.  Musculoskeletal: Normal range of motion. She exhibits no edema and no tenderness.  Lymphadenopathy:  She has no cervical adenopathy.  She has axillary adenopathy.  There is a slightly enlarged right axillary lymph node  Neurological: She is alert and oriented to person, place, and time.  Skin: Skin is warm and dry. No rash noted. She is not diaphoretic. No erythema.  Psychiatric: Her behavior is normal. Judgment normal.  Breasts: I cannot palpate a mass in the right breast specifically at the area of concern. The areola and nipple are normal   Data Reviewed  Her mammogram demonstrates a 1 cm  mass at the 12:00 position of the right breast with indistinct margins   Assessment  Right breast mass   Plan  The mammogram is worrisome  for potential malignancy. I discussed the reasons for a stereotactic biopsy. She just wants to go ahead and have the mass removed which I believe is reasonable. She understands that further surgery may be necessary if malignancy is found. She also understands this may be a benign lesion. This would have to be done with a needle localization. I discussed the risks of surgery which includes but is not limited to bleeding, infection, injury to surround structures, need for further surgery should the margins be positive, cardiopulmonary issues given her age, Melvern Banker. She will have to stop her Coumadin 5 days preoperatively. Surgery will be scheduled

## 2013-11-09 NOTE — Op Note (Signed)
BREAST LUMPECTOMY WITH NEEDLE LOCALIZATION  Procedure Note  Katherine Powell 11/09/2013   Pre-op Diagnosis: right breast mass     Post-op Diagnosis: same  Procedure(s): RIGHT BREAST PARTIAL MASTECTOMY WITH NEEDLE LOCALIZATION  Surgeon(s): [name], MD  Anesthesia: General  Staff:  Circulator: Herminio Commons, RN Scrub Person: Julio Sicks, RN  Estimated Blood Loss: Minimal               Specimens sent to path          [facility]   Date: 11/09/2013  Time: 3:21 PM

## 2013-11-09 NOTE — Anesthesia Preprocedure Evaluation (Addendum)
Anesthesia Evaluation  Patient identified by MRN, date of birth, ID band Patient awake    Reviewed: Allergy & Precautions, H&P , NPO status , Patient's Chart, lab work & pertinent test results  Airway Mallampati: II TM Distance: >3 FB Neck ROM: Full    Dental no notable dental hx. (+) Teeth Intact and Dental Advisory Given   Pulmonary neg pulmonary ROS,    Pulmonary exam normal       Cardiovascular hypertension, On Medications + dysrhythmias Atrial Fibrillation Rhythm:Regular Rate:Normal     Neuro/Psych negative neurological ROS  negative psych ROS   GI/Hepatic negative GI ROS, Neg liver ROS,   Endo/Other  negative endocrine ROS  Renal/GU negative Renal ROS  negative genitourinary   Musculoskeletal   Abdominal (+) + obese,   Peds  Hematology negative hematology ROS (+)   Anesthesia Other Findings   Reproductive/Obstetrics negative OB ROS                          Anesthesia Physical Anesthesia Plan  ASA: III  Anesthesia Plan: General   Post-op Pain Management:    Induction: Intravenous  Airway Management Planned: LMA  Additional Equipment:   Intra-op Plan:   Post-operative Plan: Extubation in OR  Informed Consent: I have reviewed the patients History and Physical, chart, labs and discussed the procedure including the risks, benefits and alternatives for the proposed anesthesia with the patient or authorized representative who has indicated his/her understanding and acceptance.   Dental advisory given  Plan Discussed with: CRNA and Anesthesiologist  Anesthesia Plan Comments:        Anesthesia Quick Evaluation

## 2013-11-09 NOTE — Anesthesia Procedure Notes (Signed)
Procedure Name: LMA Insertion Performed by: Shyann Hefner, [place] Pre-anesthesia Checklist: Patient identified, Emergency Drugs available, Suction available and Patient being monitored Patient Re-evaluated:Patient Re-evaluated prior to inductionOxygen Delivery Method: Circle System Utilized Preoxygenation: Pre-oxygenation with 100% oxygen Intubation Type: IV induction Ventilation: Mask ventilation without difficulty LMA: LMA inserted LMA Size: 4.0 Number of attempts: 1 Airway Equipment and Method: bite block Placement Confirmation: positive ETCO2 Tube secured with: Tape Dental Injury: Teeth and Oropharynx as per pre-operative assessment      

## 2013-11-09 NOTE — Transfer of Care (Signed)
Immediate Anesthesia Transfer of Care Note  Patient: Katherine Powell  Procedure(s) Performed: Procedure(s): BREAST LUMPECTOMY WITH NEEDLE LOCALIZATION (Right)  Patient Location: PACU  Anesthesia Type:General  Level of Consciousness: awake and patient cooperative  Airway & Oxygen Therapy: Patient Spontanous Breathing and Patient connected to face mask oxygen  Post-op Assessment: Report given to PACU RN and Post -op Vital signs reviewed and stable  Post vital signs: Reviewed and stable  Complications: No apparent anesthesia complications

## 2013-11-09 NOTE — Anesthesia Postprocedure Evaluation (Signed)
  Anesthesia Post-op Note  Patient: Katherine Powell  Procedure(s) Performed: Procedure(s): BREAST LUMPECTOMY WITH NEEDLE LOCALIZATION (Right)  Patient Location: PACU  Anesthesia Type:General  Level of Consciousness: awake and alert   Airway and Oxygen Therapy: Patient Spontanous Breathing  Post-op Pain: mild  Post-op Assessment: Post-op Vital signs reviewed, Patient's Cardiovascular Status Stable and Respiratory Function Stable  Post-op Vital Signs: Reviewed  Filed Vitals:   11/09/13 1651  BP: 125/60  Pulse: 71  Temp:   Resp: 10    Complications: No apparent anesthesia complications

## 2013-11-11 ENCOUNTER — Other Ambulatory Visit (INDEPENDENT_AMBULATORY_CARE_PROVIDER_SITE_OTHER): Payer: Self-pay | Admitting: Surgery

## 2013-11-11 ENCOUNTER — Encounter (HOSPITAL_BASED_OUTPATIENT_CLINIC_OR_DEPARTMENT_OTHER): Payer: Self-pay | Admitting: Surgery

## 2013-11-11 DIAGNOSIS — C50911 Malignant neoplasm of unspecified site of right female breast: Secondary | ICD-10-CM

## 2013-11-12 ENCOUNTER — Telehealth (INDEPENDENT_AMBULATORY_CARE_PROVIDER_SITE_OTHER): Payer: Self-pay | Admitting: General Surgery

## 2013-11-12 NOTE — Telephone Encounter (Signed)
Called patient on both phone and [facility] for her to call me back and ask for Pattricia Boss, she has questions about her surgery she just had with Dr [name]. Patient had right breast surgery

## 2013-11-12 NOTE — Op Note (Signed)
[name], [name]               ACCOUNT NO.:  [id number]  MEDICAL RECORD NO.:  [id number]  LOCATION:                               FACILITY:  [facility]  PHYSICIAN:  Abigail Miyamoto, M.D. DATE OF BIRTH:  1932/06/11  DATE OF PROCEDURE:  11/09/2013 DATE OF DISCHARGE:  11/09/2013                              OPERATIVE REPORT   PREOPERATIVE DIAGNOSIS:  Right breast mass.  POSTOPERATIVE DIAGNOSIS:  Right breast mass.  PROCEDURE:  Needle localized right breast partial mastectomy.  SURGEON:  Abigail Miyamoto, M.D.  ANESTHESIA:  General 0.5% Marcaine with epinephrine.  ESTIMATED BLOOD LOSS:  Minimal.  INDICATIONS:  This is a 79 year old female who was found on recent screening mammography to have a mass in the upper outer quadrant of the right breast, suspicious for a malignancy.  The mass was not palpable. She refused biopsy and wanted proceed with partial mastectomy.  PROCEDURE IN DETAIL:  The patient was brought to the operating room, identified as Milus Height.  She had a localization wire placed in the breast on the right side.  General anesthesia was induced.  Her right breast and chest were then prepped and draped in usual sterile fashion. I made a transverse incision in the upper outer quadrant of the right breast going from 10 o'clock to 12 o'clock after anesthetized the skin with lidocaine.  I took this down to the breast tissue with electrocautery.  I pulled the localization wire into the incision.  I then performed a wide partial mastectomy going all the way down to the chest wall.  Once, I was in the breast tissue and the hard palpable mass could be identified.  It appeared to be widely around the mass.  I did take some more anterolateral margin.  I did mark the original partial mastectomy specimen with marker paint.  The specimen was sent to pathology for evaluation along with the new margin.  Again wide partial mastectomy appeared to be achieved around.  The mass  which was approximately 1 cm in size.  I then anesthetized the wound circumferentially with Marcaine with epinephrine.  I achieved hemostasis with the cautery.  I irrigated the wound with saline.  I then closed subcutaneous tissue with interrupted 3-0 Vicryl sutures and closed the skin with a running 4-0 Monocryl.  Steri-Strips, gauze, and Tegaderm were then applied.  The patient tolerated the procedure well.  All the counts were correct at the end of the procedure.  The patient was then extubated in operating room and taken in stable condition to recovery.     Abigail Miyamoto, M.D.   ______________________________ Abigail Miyamoto, M.D.    DB/MEDQ  D:  11/09/2013  T:  11/10/2013  Job:  [id number]

## 2013-11-12 NOTE — Telephone Encounter (Signed)
Called patient back and I went back over her path result and I we talked about her seeing medical and radiology oncology and that Dr Roselind Messier will go over more in deal of the cancer treatment. I also told her that Dr [name] will talk to her more when she come back to see him on 11-30-2013

## 2013-11-13 ENCOUNTER — Encounter (INDEPENDENT_AMBULATORY_CARE_PROVIDER_SITE_OTHER): Payer: Self-pay

## 2013-11-13 ENCOUNTER — Telehealth: Payer: Self-pay | Admitting: *Deleted

## 2013-11-13 NOTE — Telephone Encounter (Signed)
Left message at home and on cell for pt to return my call so I can schedule an appt w/ Dr. [name] for her.

## 2013-11-16 ENCOUNTER — Telehealth: Payer: Self-pay | Admitting: *Deleted

## 2013-11-16 NOTE — Telephone Encounter (Signed)
Scheduled and confirmed pt to see Dr. [name] on 11/24/13 at 4:00 for labs and Dr. [name] at 4:30.  Gave pt directions and instructions.  Pt denies further needs at this time.

## 2013-11-17 ENCOUNTER — Other Ambulatory Visit: Payer: Self-pay | Admitting: *Deleted

## 2013-11-17 ENCOUNTER — Encounter: Payer: Self-pay | Admitting: *Deleted

## 2013-11-17 DIAGNOSIS — C50411 Malignant neoplasm of upper-outer quadrant of right female breast: Secondary | ICD-10-CM | POA: Insufficient documentation

## 2013-11-17 NOTE — Progress Notes (Signed)
Completed chart and gave to [facility] to enter labs and give back to me.

## 2013-11-18 ENCOUNTER — Encounter: Payer: Self-pay | Admitting: *Deleted

## 2013-11-18 NOTE — Progress Notes (Signed)
Received chart back from Dawn and placed in Dr. Magrinat's box.  

## 2013-11-20 ENCOUNTER — Encounter: Payer: Self-pay | Admitting: Radiation Oncology

## 2013-11-20 DIAGNOSIS — C50919 Malignant neoplasm of unspecified site of unspecified female breast: Secondary | ICD-10-CM | POA: Insufficient documentation

## 2013-11-20 NOTE — Progress Notes (Signed)
Location of Breast Cancer: right upper outer  Histology per Pathology Report:  11/09/13 Diagnosis 1. Breast, lumpectomy, Right - INVASIVE DUCTAL CARCINOMA, GRADE II/III, SPANNING 1.1 CM. - DUCTAL CARCINOMA IN SITU, LOW GRADE. - INVASIVE CARCINOMA IS FOCALLY 0.1 CM FROM THE LATERAL MARGIN OF SPECIMEN #1. - SEE ONCOLOGY TABLE BELOW. 2. Breast, lumpectomy, Right anterior / lateral margin - BENIGN BREAST PARENCHYMA. - THERE IS NO EVIDENCE OF MALIGNANCY. - SEE COMMENT.  Receptor Status: ER(100%), PR (94%), Her2-neu (-)  Did patient present with symptoms (if so, please note symptoms) or was this found on screening mammography?: screening mammogram  Past/Anticipated interventions by surgeon, if any: 11/09/13 right lumpectomy  Past/Anticipated interventions by medical oncology, if any: Chemotherapy - pt's first appt w/Dr Magrinat 11/24/13, 4 pm  Lymphedema issues, if any:  none  Pain issues, if any:  none  SAFETY ISSUES:  Prior radiation? no  Pacemaker/ICD? no  Possible current pregnancy? no  Is the patient on methotrexate? no  Current Complaints / other details:  Retired Char[employer] fundraiser, worked at [employer], married, 3 children, 7 grandchildren Pt states she has mild back pain "due to yoga".    Glennie Hawk, RN 11/20/2013,2:50 PM

## 2013-11-24 ENCOUNTER — Other Ambulatory Visit (HOSPITAL_BASED_OUTPATIENT_CLINIC_OR_DEPARTMENT_OTHER): Payer: Medicare Other

## 2013-11-24 ENCOUNTER — Ambulatory Visit
Admission: RE | Admit: 2013-11-24 | Discharge: 2013-11-24 | Disposition: A | Discharge status: Home / Self Care | Payer: Medicare Other | Source: Ambulatory Visit | Attending: Radiation Oncology | Admitting: Radiation Oncology

## 2013-11-24 ENCOUNTER — Encounter: Payer: Self-pay | Admitting: Radiation Oncology

## 2013-11-24 ENCOUNTER — Ambulatory Visit: Payer: Medicare Other

## 2013-11-24 ENCOUNTER — Ambulatory Visit (HOSPITAL_BASED_OUTPATIENT_CLINIC_OR_DEPARTMENT_OTHER): Payer: Medicare Other | Admitting: Oncology

## 2013-11-24 VITALS — BP 136/81 | HR 58 | Temp 97.7°F | Resp 18 | Ht 65.5 in | Wt 149.6 lb

## 2013-11-24 VITALS — BP 147/76 | HR 63 | Temp 97.6°F | Resp 20 | Ht 65.5 in | Wt 150.0 lb

## 2013-11-24 DIAGNOSIS — R599 Enlarged lymph nodes, unspecified: Secondary | ICD-10-CM | POA: Insufficient documentation

## 2013-11-24 DIAGNOSIS — C50419 Malignant neoplasm of upper-outer quadrant of unspecified female breast: Secondary | ICD-10-CM

## 2013-11-24 DIAGNOSIS — Z170 Estrogen receptor positive status [ER+]: Secondary | ICD-10-CM

## 2013-11-24 DIAGNOSIS — F411 Generalized anxiety disorder: Secondary | ICD-10-CM | POA: Insufficient documentation

## 2013-11-24 DIAGNOSIS — C50411 Malignant neoplasm of upper-outer quadrant of right female breast: Secondary | ICD-10-CM

## 2013-11-24 DIAGNOSIS — I4891 Unspecified atrial fibrillation: Secondary | ICD-10-CM | POA: Insufficient documentation

## 2013-11-24 DIAGNOSIS — I499 Cardiac arrhythmia, unspecified: Secondary | ICD-10-CM | POA: Insufficient documentation

## 2013-11-24 DIAGNOSIS — Z7901 Long term (current) use of anticoagulants: Secondary | ICD-10-CM | POA: Insufficient documentation

## 2013-11-24 DIAGNOSIS — I10 Essential (primary) hypertension: Secondary | ICD-10-CM | POA: Insufficient documentation

## 2013-11-24 DIAGNOSIS — Z79899 Other long term (current) drug therapy: Secondary | ICD-10-CM | POA: Insufficient documentation

## 2013-11-24 DIAGNOSIS — C50919 Malignant neoplasm of unspecified site of unspecified female breast: Secondary | ICD-10-CM | POA: Insufficient documentation

## 2013-11-24 HISTORY — DX: Unspecified osteoarthritis, unspecified site: M19.90

## 2013-11-24 HISTORY — DX: Anxiety disorder, unspecified: F41.9

## 2013-11-24 HISTORY — DX: Malignant neoplasm of unspecified site of unspecified female breast: C50.919

## 2013-11-24 LAB — COMPREHENSIVE METABOLIC PANEL (CC13)
ALT: 14 U/L (ref 0–55)
AST: 20 U/L (ref 5–34)
Albumin: 4.1 g/dL (ref 3.5–5.0)
Alkaline Phosphatase: 66 U/L (ref 40–150)
Anion Gap: 10 mEq/L (ref 3–11)
BUN: 14.2 mg/dL (ref 7.0–26.0)
CO2: 26 mEq/L (ref 22–29)
Calcium: 9.3 mg/dL (ref 8.4–10.4)
Chloride: 105 mEq/L (ref 98–109)
Creatinine: 0.9 mg/dL (ref 0.6–1.1)
Glucose: 90 mg/dl (ref 70–140)
Potassium: 4 mEq/L (ref 3.5–5.1)
Sodium: 141 mEq/L (ref 136–145)
Total Bilirubin: 0.33 mg/dL (ref 0.20–1.20)
Total Protein: 6.9 g/dL (ref 6.4–8.3)

## 2013-11-24 LAB — CBC WITH DIFFERENTIAL/PLATELET
BASO%: 0.8 % (ref 0.0–2.0)
Basophils Absolute: 0 10*3/uL (ref 0.0–0.1)
EOS%: 2.8 % (ref 0.0–7.0)
Eosinophils Absolute: 0.2 10*3/uL (ref 0.0–0.5)
HCT: 36.8 % (ref 34.8–46.6)
HGB: 12.4 g/dL (ref 11.6–15.9)
LYMPH%: 28.3 % (ref 14.0–49.7)
MCH: 32.2 pg (ref 25.1–34.0)
MCHC: 33.7 g/dL (ref 31.5–36.0)
MCV: 95.8 fL (ref 79.5–101.0)
MONO#: 0.4 10*3/uL (ref 0.1–0.9)
MONO%: 7 % (ref 0.0–14.0)
NEUT#: 3.7 10*3/uL (ref 1.5–6.5)
NEUT%: 61.1 % (ref 38.4–76.8)
Platelets: 227 10*3/uL (ref 145–400)
RBC: 3.84 10*6/uL (ref 3.70–5.45)
RDW: 14.2 % (ref 11.2–14.5)
WBC: 6.1 10*3/uL (ref 3.9–10.3)
lymph#: 1.7 10*3/uL (ref 0.9–3.3)

## 2013-11-24 NOTE — Progress Notes (Signed)
[facility]  [phone] [phone]     ID: Lance Morin OB: Sep 16, 1932  MR#: [id number]  JYN#:[id number]  PCP: [name], MD GYN:   SUAbigail Miyamoto OTHER MD: Chipper Herb, Rogelia Mire, Theressa Millard, Verdis Prime  CHIEF COMPLAINT: "I have breast cancer"  HISTORY OF PRESENT ILLNESS: Melodee had routine screening mammography at [facility] 10/14/2013 showing a 1 cm irregular mass in the right breast. Right digital mammography and ultrasonography of the right breast 10/21/2013 showed a mass with indistinct margins at the 12:00 position which by ultrasound measured 1 cm, and was lobulated. Biopsy was recommended, but the patient opted to proceed directly with lumpectomy, and this was performed 11/09/2013. The final pathology ([facility] [id number]) showed an invasive ductal carcinoma, grade 2, measuring 1.1 cm, with low-grade ductal carcinoma in situ. The tumor was estrogen receptor 100% positive, and progesterone receptor 94% positive, both with strong staining intensity. The MIB-1 was 10%. There was no HER-2 amplification with a ratio by CISH of 0.90, and the number per cell being 1.80.  The patient's subsequent history is as detailed below   INTERVAL HISTORY: Denissa was evaluated in the breast clinic on 11/24/2013, and accompanied by her husband Rayna Sexton. Her case was also discussed at the multidisciplinary breast cancer clinic 11/18/2013  REVIEW OF SYSTEMS: There were no specific symptoms leading to the original mammogram, which was routinely scheduled. The patient denies unusual headaches, visual changes, nausea, vomiting, stiff neck, dizziness, or gait imbalance. There has been no cough, phlegm production, or pleurisy, no chest pain or pressure, and no change in bowel or bladder habits. The patient has a history of atrial fibrillation and is on chronic Coumadin. She feels anxious, but not depressed. Sometimes food gets "stuck" when she tries to swallow, but  never liquids The patient denies fever, rash, bleeding, unexplained fatigue or unexplained weight loss. A detailed review of systems was otherwise entirely negative.  PAST MEDICAL HISTORY: Past Medical History  Diagnosis Date  . Arrhythmia     atrial fibrillation/  on   . Hypertension   . Rosacea   . Atrial fibrillation   . Postmenopausal   . Sleep disturbance   . Thyroid disease   . Urinary incontinence   . Contact lens/glasses fitting     contact rt eye  . Seasonal allergies   . Arthritis   . Anxiety   . Breast cancer 11/09/13    right upper outer    PAST SURGICAL HISTORY: Past Surgical History  Procedure Laterality Date  . Cystocele repair  2008  . Rectocele repair  2008  . Tonsillectomy    . Cataract extraction  2013    both  . Appendectomy  1954  . Eye surgery      cataracts-plugs for eyes  . Back surgery      lumb-2005  . Breast lumpectomy with needle localization Right 11/09/2013    Procedure: BREAST LUMPECTOMY WITH NEEDLE LOCALIZATION;  Surgeon: [name], MD;  Location: [facility];  Service: General;  Laterality: Right;  . Abdominal hysterectomy  1973    endometriosis  . Bilateral oophorectomy      benign tumor on ovary    FAMILY HISTORY Family History  Problem Relation Age of Onset  . Alzheimer's disease Mother    the patient's father died from COPD at age 54 in the setting of tobacco abuse. The patient's mother died at the age of 23 with Alzheimer's disease. The patient had no brothers, one sister.  There is no history of breast or ovarian cancer in the family to her knowledge  GYNECOLOGIC HISTORY:  Menarche age 44, first live birth age 79. The patient is GX P3. She underwent simple hysterectomy in 1973 and took hormone replacement (initially Prempro later estrogens only) until December 2014. 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 Her husband Rayna Sexton was an Art gallery manager for [employer]. Their son Marcial Pacas lives in [place] where he works in Consulting civil engineer. Daughter and Soundra Pilon lives in [place] where she works as a Publishing rights manager in an independent clinic. Daughter Waunita Schooner listened [employer]. She works as a Transport planner. The patient has 7 grandchildren. She is a International aid/development worker.    ADVANCED DIRECTIVES: In place   HEALTH MAINTENANCE: History  Substance Use Topics  . Smoking status: Never Smoker   . Smokeless tobacco: Not on file  . Alcohol Use: No     Colonoscopy:  PAP:  Bone density: At [facility] 09/27/2010, normal  Lipid panel:  Allergies  Allergen Reactions  . Codeine     nausea    Current Outpatient Prescriptions  Medication Sig Dispense Refill  . acetaminophen (TYLENOL) 650 MG CR tablet Take 650 mg by mouth every 8 (eight) hours as needed for pain.      [name] ALPRAZolam (XANAX) 0.5 MG tablet Take 0.5 mg by mouth at bedtime as needed for anxiety.      . Azelaic Acid (FINACEA) 15 % cream Apply topically 2 (two) times daily. After skin is thoroughly washed and patted dry, gently but thoroughly massage a thin film of azelaic acid cream into the affected area twice daily, in the morning and evening.      . calcium carbonate (OS-CAL) 600 MG TABS tablet Take 600 mg by mouth 2 (two) times daily with a meal.      . cholecalciferol (VITAMIN D) 400 UNITS TABS tablet Take 400 Units by mouth.      . cycloSPORINE (RESTASIS) 0.05 % ophthalmic emulsion 1 drop 2 (two) times daily.      [name] diltiazem (CARDIZEM SR) 120 MG 12 hr capsule Take 120 mg by mouth 2 (two) times daily.      . fexofenadine (ALLEGRA) 180 MG tablet Take 180 mg by mouth daily.      . flecainide (TAMBOCOR) 50 MG tablet       . fluticasone (FLONASE) 50 MCG/ACT nasal spray Place into both nostrils daily.      [name] glucosamine-chondroitin 500-400 MG tablet Take 1 tablet by mouth 3  (three) times daily.      [name] guaiFENesin (MUCINEX) 600 MG 12 hr tablet Take by mouth 2 (two) times daily.      [name] HYDROcodone-acetaminophen (NORCO) 5-325 MG per tablet Take 1-2 tablets by mouth every 4 (four) hours as needed for moderate pain.  30 tablet  0  . Melatonin 10 MG TABS Take by mouth.      . METRONIDAZOLE EX Apply topically.      [name] Glycol-Propyl Glycol (SYSTANE) 0.4-0.3 % GEL Apply to eye.      . sodium chloride (OCEAN) 0.65 % nasal spray Place 1 spray into the nose as needed for congestion.      . trolamine salicylate (ASPERCREME) 10 % cream Apply 1 application topically as needed for muscle pain.      [name]  vitamin E 400 UNIT capsule Take 400 Units by mouth daily.      [name] warfarin (COUMADIN) 5 MG tablet        No current facility-administered medications for this visit.    OBJECTIVE: Older white female in in no acute distress Filed Vitals:   11/24/13 1608  BP: 136/81  Pulse: 58  Temp: 97.7 F (36.5 C)  Resp: 18     Body mass index is 24.51 kg/(m^2).    ECOG FS:0 - Asymptomatic  Ocular: Sclerae unicteric, pupils equal, round and reactive to light Ear-nose-throat: Oropharynx clear, dentition fair Lymphatic: No cervical or supraclavicular adenopathy Lungs no rales or rhonchi, good excursion bilaterally Heart regular rate and rhythm, no murmur appreciated Abd soft, nontender, positive bowel sounds MSK no focal spinal tenderness, no joint edema Neuro: non-focal, well-oriented, appropriate affect Breasts: The right breast is status post recent lumpectomy. The incision is healing nicely. There is no evidence of dehiscence, swelling, erythema, or unusual tenderness. The right axilla is benign the left breast is unremarkable.  LAB RESULTS:  CMP     Component Value Date/Time   NA 141 11/24/2013 1554   NA 139 11/09/2013 1200   K 4.0 11/24/2013 1554   K 3.5 11/09/2013 1200   CL 104 11/09/2013 1200   CO2 26 11/24/2013 1554   CO2 28 11/09/2013 1200   GLUCOSE 90 11/24/2013  1554   GLUCOSE 86 11/09/2013 1200   BUN 14.2 11/24/2013 1554   BUN 14 11/09/2013 1200   CREATININE 0.9 11/24/2013 1554   CREATININE 0.89 11/09/2013 1200   CALCIUM 9.3 11/24/2013 1554   CALCIUM 8.9 11/09/2013 1200   PROT 6.9 11/24/2013 1554   ALBUMIN 4.1 11/24/2013 1554   AST 20 11/24/2013 1554   ALT 14 11/24/2013 1554   ALKPHOS 66 11/24/2013 1554   BILITOT 0.33 11/24/2013 1554   GFRNONAA 60* 11/09/2013 1200   GFRAA 70* 11/09/2013 1200    I No results found for this basename: SPEP, UPEP,  kappa and lambda light chains    Lab Results  Component Value Date   WBC 6.1 11/24/2013   NEUTROABS 3.7 11/24/2013   HGB 12.4 11/24/2013   HCT 36.8 11/24/2013   MCV 95.8 11/24/2013   PLT 227 11/24/2013      Chemistry      Component Value Date/Time   NA 141 11/24/2013 1554   NA 139 11/09/2013 1200   K 4.0 11/24/2013 1554   K 3.5 11/09/2013 1200   CL 104 11/09/2013 1200   CO2 26 11/24/2013 1554   CO2 28 11/09/2013 1200   BUN 14.2 11/24/2013 1554   BUN 14 11/09/2013 1200   CREATININE 0.9 11/24/2013 1554   CREATININE 0.89 11/09/2013 1200      Component Value Date/Time   CALCIUM 9.3 11/24/2013 1554   CALCIUM 8.9 11/09/2013 1200   ALKPHOS 66 11/24/2013 1554   AST 20 11/24/2013 1554   ALT 14 11/24/2013 1554   BILITOT 0.33 11/24/2013 1554       No results found for this basename: LABCA2    No components found with this basename: LABCA125    No results found for this basename: INR,  in the last 168 hours  Urinalysis No results found for this basename: colorurine, appearanceur, labspec, phurine, glucoseu, hgbur, bilirubinur, ketonesur, proteinur, urobilinogen, nitrite, leukocytesur    STUDIES: No results found.  ASSESSMENT: 79 y.o. [employer] woman  (1) Status post right lumpectomy 11/09/2013 for a pT1c NX, stage IA invasive ductal carcinoma, grade 2,  estrogen receptor 100% positive, progesterone receptor 94% positive, with an MIB-1 of 10%, and no HER-2 amplification.  (2)  adjuvant radiation oncology to follow  (3) anti-estrogen therapy to follow radiation  (4) the patient is status post hysterectomy and bilateral salpingo-oophorectomy  PLAN: We spent the better part of today's hour-long appointment discussing the biology of breast cancer in general, and the specifics of the patient's tumor in particular. The patient did not undergo sentinel lymph node sampling, because it was felt whether the patient had positive lymph nodes are not would not affect her adjuvant treatment. In particular, she is going to receive radiation under Dr. Chipper Herb, and today we discussed the possible benefit of chemotherapy. Note that there is no clinical indication of axillary adenopathy. Even if she had lymph nodes positive, the benefit in terms of mortality reduction would be in the 1% range. Accordingly I am comfortable with foregoing axillary lymph node sampling in this case, even though that would be the standard according to NCCN. (However NCCN guidelines do note there is less data available for patients over 70 and there for their recommendations for older patients are much more tentative.)  Accordingly the next step is radiation, and Dr. Chipper Herb is operationalizing that. Demand I will return to see me in approximately 2 months. Before that visit we will obtain a repeat bone density, which will help guide our treatment. In particular, since she has a history of atrial fibrillation, we will likely stay away from tamoxifen and opted for one of the aromatase inhibitors.  Lacrisha has a good understanding of the overall plan, and agrees with it. She knows the goal of treatment is cure. She will call with any problems that may develop before next visit here. The    Lowella Dell, MD   11/24/2013 4:39 PM

## 2013-11-24 NOTE — Addendum Note (Signed)
Encounter addended by: Maryln Gottron, MD on: 11/24/2013  5:36 PM<BR>     Documentation filed: Notes Section

## 2013-11-24 NOTE — Progress Notes (Signed)
Please see the Nurse Progress Note in the MD Initial Consult Encounter for this patient. 

## 2013-11-24 NOTE — Progress Notes (Addendum)
[facility] Radiation Oncology NEW PATIENT EVALUATION  Name: Katherine Powell MRN: [id number]  Date:   11/24/2013           DOB: 1932-04-25  Status: outpatient   CC: [name], MD  [name], MD    REFERRING PHYSICIAN: [name], MD   DIAGNOSIS: Stage I (T1c N0 M0) invasive ductal carcinoma/DCIS of the right breast   HISTORY OF PRESENT ILLNESS:  Katherine Powell is a 79 y.o. female who is seen today through the courtesy of Dr. [name] for evaluation of her T1c N0 invasive ductal carcinoma/DCIS of the right breast. She was noted to have a 1 cm irrregular mass with spiculated margins at 12:00 on mammography at [facility] in early November. This was felt to be suspicious. Ultrasound on 10/21/2013 showed a 1 cm over mass. She declined biopsy and simply wanted to proceed with "lumpectomy". Dr. [name] noted in his consultation note that there was an axillary "adenopathy ". She underwent ultrasound-guided wire localization with a right partial mastectomy on 11/09/2013. She was found to have a 1.1 cm invasive ductal carcinoma, grade 2, along with low-grade DCIS. The initial margin was focally 0.1 cm from the lateral margin, but further excision of the lateral margin was without evidence for malignancy. There was no sentinel lymph node biopsy. She is doing well postoperatively. Her tumor was ER positive at 100% and PR +95% with a low proliferation marker Ki-67 of 10%. She is scheduled see Dr. [name] later this afternoon. She had been on estrogen replacement therapy for approximately 35-40 years and this was discontinued preoperatively.  PREVIOUS RADIATION THERAPY: No   PAST MEDICAL HISTORY:  has a past medical history of Arrhythmia; Hypertension; Rosacea; Atrial fibrillation; Postmenopausal; Sleep disturbance; Thyroid disease; Urinary incontinence; Contact lens/glasses fitting; Seasonal allergies; Arthritis; Anxiety; and Breast cancer (11/09/13).     PAST SURGICAL  HISTORY:  Past Surgical History  Procedure Laterality Date  . Cystocele repair  2008  . Rectocele repair  2008  . Tonsillectomy    . Cataract extraction  2013    both  . Appendectomy  1954  . Eye surgery      cataracts-plugs for eyes  . Back surgery      lumb-2005  . Breast lumpectomy with needle localization Right 11/09/2013    Procedure: BREAST LUMPECTOMY WITH NEEDLE LOCALIZATION;  Surgeon: [name], MD;  Location: [facility];  Service: General;  Laterality: Right;  . Abdominal hysterectomy  1973    endometriosis  . Bilateral oophorectomy      benign tumor on ovary     FAMILY HISTORY: family history includes Alzheimer's disease in her mother. Mother died from complications of Alzheimer's disease at 48. Her father.respiratory failure at 44.   SOCIAL HISTORY:  reports that she has never smoked. She does not have any smokeless tobacco history on file. She reports that she does not drink alcohol or use illicit drugs. Married, 3 children, retired Engineer, civil (consulting).   ALLERGIES: Codeine   MEDICATIONS:  Current Outpatient Prescriptions  Medication Sig Dispense Refill  . acetaminophen (TYLENOL) 650 MG CR tablet Take 650 mg by mouth every 8 (eight) hours as needed for pain.      [name] ALPRAZolam (XANAX) 0.5 MG tablet Take 0.5 mg by mouth at bedtime as needed for anxiety.      . Azelaic Acid (FINACEA) 15 % cream Apply topically 2 (two) times daily. After skin is thoroughly washed and patted dry, gently but thoroughly massage  a thin film of azelaic acid cream into the affected area twice daily, in the morning and evening.      . calcium carbonate (OS-CAL) 600 MG TABS tablet Take 600 mg by mouth 2 (two) times daily with a meal.      . cholecalciferol (VITAMIN D) 400 UNITS TABS tablet Take 400 Units by mouth.      . cycloSPORINE (RESTASIS) 0.05 % ophthalmic emulsion 1 drop 2 (two) times daily.      [name] diltiazem (CARDIZEM SR) 120 MG 12 hr capsule Take 120 mg by mouth 2 (two) times  daily.      . fexofenadine (ALLEGRA) 180 MG tablet Take 180 mg by mouth daily.      . flecainide (TAMBOCOR) 50 MG tablet       . fluticasone (FLONASE) 50 MCG/ACT nasal spray Place into both nostrils daily.      [name] glucosamine-chondroitin 500-400 MG tablet Take 1 tablet by mouth 3 (three) times daily.      [name] guaiFENesin (MUCINEX) 600 MG 12 hr tablet Take by mouth 2 (two) times daily.      . Melatonin 10 MG TABS Take by mouth.      . METRONIDAZOLE EX Apply topically.      [name] Glycol-Propyl Glycol (SYSTANE) 0.4-0.3 % GEL Apply to eye.      . sodium chloride (OCEAN) 0.65 % nasal spray Place 1 spray into the nose as needed for congestion.      . trolamine salicylate (ASPERCREME) 10 % cream Apply 1 application topically as needed for muscle pain.      . vitamin E 400 UNIT capsule Take 400 Units by mouth daily.      [name] warfarin (COUMADIN) 5 MG tablet       . HYDROcodone-acetaminophen (NORCO) 5-325 MG per tablet Take 1-2 tablets by mouth every 4 (four) hours as needed for moderate pain.  30 tablet  0   No current facility-administered medications for this encounter.     REVIEW OF SYSTEMS:  Pertinent items are noted in HPI.    PHYSICAL EXAM:  height is 5' 5.5" (1.664 m) and weight is 150 lb (68.04 kg). Her oral temperature is 97.6 F (36.4 C). Her blood pressure is 147/76 and her pulse is 63. Her respiration is 20.   Alert and oriented 79 year old white female appearing much younger than her stated age. Head and neck examination: Grossly unremarkable. Nodes: Without palpable cervical, supraclavicular, or axillary lymphadenopathy. Specifically, there is no right axillary adenopathy. Chest: Lungs clear. Heart: Regular rate and rhythm. Breasts: There is a partial mastectomy wound extending from approximately 11-1:00. No masses are appreciated. Left breast without masses or lesions. Abdomen without hepatomegaly. Extremities: Without edema.   LABORATORY DATA:  Lab Results  Component Value Date    HGB 12.6 11/09/2013   Lab Results  Component Value Date   NA 139 11/09/2013   K 3.5 11/09/2013   CL 104 11/09/2013   CO2 28 11/09/2013   No results found for this basename: ALT, AST, GGT, ALKPHOS, BILITOT      IMPRESSION: Stage I (T1c N0 M0) invasive ductal/DCIS of the right breast. Of note is that Dr. [name] reported "adenopathy" in the right axilla when he saw her in consultation. I do not appreciate any adenopathy nor is there any mention made of adenopathy on mammography or ultrasonography of the right breast. One has to say that she has T1c N0 disease. Local regional management options include mastectomy versus partial mastectomy  plus or minus radiation therapy, plus or minus adjuvant hormone therapy. I have a call in to Dr. [name] regarding his initial axillary findings, but he will not be available until he returns to the office next week. I reviewed the over a 70 (CALGB trial 9343) which randomized patients to tamoxifen alone or tamoxifen plus radiation therapy for clinical/pathologic stage I (T1N0)  node-negative disease. Of note is that the vast majority of patients did not have any surgical staging of the axilla. They had to be clinically node negative. At 12 years followup there was a 10% risk for local regional recurrence with tamoxifen alone and a 2% risk for local regional recurrence with combination tamoxifen plus radiation therapy. Therefore there is an 8% difference in local regional control. There was no difference in overall survival or mastectomy rate. I did offer her hypo-fractionated radiation therapy (16 fractions) over 3 weeks if she wanted to improve local control since he probably has a 10 life expectancy. I explained to the patient and her husband that  radiation therapy is to only improve local regional control, not overall survival or mastectomy rate. From a technical standpoint, I would treat her with "high tangents" to cover her lower axilla. She and her husband would  like to proceed with hypofractionated breast radiation after the holidays.   PLAN: As discussed above. I'll have her return for simulation/treatment planning in early January. She will meet Dr. [name] this afternoon for discussion of adjuvant hormone therapy which improves local regional control and also disease-free survival. I'll speak with Dr. [name] when he returns to the office on Monday, December 29. I spent 60 minutes minutes face to face with the patient and more than 50% of that time was spent in counseling and/or coordination of care.

## 2013-11-25 ENCOUNTER — Ambulatory Visit: Payer: Medicare Other | Admitting: Radiation Oncology

## 2013-11-25 ENCOUNTER — Ambulatory Visit: Payer: Medicare Other

## 2013-11-30 ENCOUNTER — Other Ambulatory Visit: Payer: Self-pay | Admitting: Oncology

## 2013-11-30 ENCOUNTER — Encounter (INDEPENDENT_AMBULATORY_CARE_PROVIDER_SITE_OTHER): Payer: Self-pay | Admitting: Surgery

## 2013-11-30 ENCOUNTER — Ambulatory Visit (INDEPENDENT_AMBULATORY_CARE_PROVIDER_SITE_OTHER): Payer: Medicare Other | Admitting: Surgery

## 2013-11-30 VITALS — BP 120/84 | HR 60 | Temp 99.0°F | Resp 14 | Ht 66.5 in | Wt 153.0 lb

## 2013-11-30 DIAGNOSIS — Z09 Encounter for follow-up examination after completed treatment for conditions other than malignant neoplasm: Secondary | ICD-10-CM

## 2013-11-30 NOTE — Progress Notes (Unsigned)
I asked Dr. Tilda Burrow to review the patient's ultrasound for evidence of axillary adenopathy, and he tells me ultrasound images of the axilla showed no adenopathy. She will amend the report.

## 2013-11-30 NOTE — Progress Notes (Signed)
Subjective:     Patient ID: Katherine Powell, female   DOB: [birth year], 79 y.o.   MRN: [id number]  HPI She is here for her first postop visit. She has also seen a medical and radiation oncology. She is doing well and has no complaints.  Review of Systems     Objective:   Physical Exam On exam, her incision is well-healed. Again, the pathology confirmed a right breast cancer with negative margins.    Assessment:     Patient stable postop     Plan:     She will be starting radiation same. She will then followup with anti-hormonal therapy. I will see her back in 6 months unless there is a problem.

## 2013-12-02 ENCOUNTER — Encounter: Payer: Self-pay | Admitting: *Deleted

## 2013-12-02 NOTE — Progress Notes (Signed)
Mailed after appt letter to pt. 

## 2013-12-07 ENCOUNTER — Ambulatory Visit
Admission: RE | Admit: 2013-12-07 | Discharge: 2013-12-07 | Disposition: A | Discharge status: Home / Self Care | Payer: Medicare Other | Source: Ambulatory Visit | Attending: Radiation Oncology | Admitting: Radiation Oncology

## 2013-12-07 DIAGNOSIS — C50411 Malignant neoplasm of upper-outer quadrant of right female breast: Secondary | ICD-10-CM

## 2013-12-07 DIAGNOSIS — Z9010 Acquired absence of unspecified breast and nipple: Secondary | ICD-10-CM | POA: Insufficient documentation

## 2013-12-07 DIAGNOSIS — Y842 Radiological procedure and radiotherapy as the cause of abnormal reaction of the patient, or of later complication, without mention of misadventure at the time of the procedure: Secondary | ICD-10-CM | POA: Insufficient documentation

## 2013-12-07 DIAGNOSIS — Z79899 Other long term (current) drug therapy: Secondary | ICD-10-CM | POA: Insufficient documentation

## 2013-12-07 DIAGNOSIS — C50919 Malignant neoplasm of unspecified site of unspecified female breast: Secondary | ICD-10-CM | POA: Insufficient documentation

## 2013-12-07 DIAGNOSIS — L589 Radiodermatitis, unspecified: Secondary | ICD-10-CM | POA: Insufficient documentation

## 2013-12-07 DIAGNOSIS — Z510 Encounter for antineoplastic radiation therapy: Secondary | ICD-10-CM | POA: Insufficient documentation

## 2013-12-07 NOTE — Progress Notes (Signed)
Complex simulation/treatment planning note: The patient underwent simulation/treatment planning today in the management of her carcinoma the right breast. A custom neck mold was constructed on a custom breast board for immobilization. I marked her right breast borders for "high tangents". Her partial mastectomy scar was also marked. She was then scanned. I chose and isocenter along the central breast. I contoured her tumor bed. She was then set up to medial and lateral right breast tangents. 2 separate sets of multileaf collimators are designed to conform the field. I prescribing 4250 cGy 17 sessions utilizing 6 MV photons. There is no boost.

## 2013-12-11 ENCOUNTER — Encounter: Payer: Self-pay | Admitting: *Deleted

## 2013-12-11 NOTE — Progress Notes (Signed)
[place] Psychosocial Distress Screening Clinical Social Work  Clinical Social Work was referred by distress screening protocol.  The patient scored a 4 on the Psychosocial Distress Thermometer which indicates moderate distress. Clinical Social Worker attempted to phone Pt at home to assess for distress and other psychosocial needs, but received no answer and the phone just rang with no way to leave a message. CSW noted Pt had checked anxiety as a concern, but is currently on xanax to assist. CSW will try again to phone and reach out and assess for any concerns.     Clinical Social Worker follow up needed: no  If yes, follow up plan:   Loren Racer, [place] Worker [facility] for [place] Wednesday, Thursday and Friday Phone: [phone] Fax: [phone]

## 2013-12-14 ENCOUNTER — Ambulatory Visit
Admission: RE | Admit: 2013-12-14 | Discharge: 2013-12-14 | Disposition: A | Discharge status: Home / Self Care | Payer: Medicare Other | Source: Ambulatory Visit | Attending: Radiation Oncology | Admitting: Radiation Oncology

## 2013-12-14 DIAGNOSIS — C50411 Malignant neoplasm of upper-outer quadrant of right female breast: Secondary | ICD-10-CM

## 2013-12-14 MED ORDER — RADIAPLEXRX EX GEL
Freq: Once | CUTANEOUS | Status: AC
  Administered 2013-12-14: 15:00:00 via TOPICAL

## 2013-12-14 MED ORDER — ALRA NON-METALLIC DEODORANT (RAD-ONC)
1.0000 "application " | Freq: Once | TOPICAL | Status: AC
  Administered 2013-12-14: 1 via TOPICAL

## 2013-12-14 NOTE — Progress Notes (Signed)
Simulation verification note: The patient underwent simulation verification for treatment to her right breast. Her isocenter is in good position and the multileaf collimators contoured the treatment volume appropriately.

## 2013-12-15 ENCOUNTER — Ambulatory Visit
Admission: RE | Admit: 2013-12-15 | Discharge: 2013-12-15 | Disposition: A | Discharge status: Home / Self Care | Payer: Medicare Other | Source: Ambulatory Visit | Attending: Radiation Oncology | Admitting: Radiation Oncology

## 2013-12-16 ENCOUNTER — Ambulatory Visit
Admission: RE | Admit: 2013-12-16 | Discharge: 2013-12-16 | Disposition: A | Discharge status: Home / Self Care | Payer: Medicare Other | Source: Ambulatory Visit | Attending: Radiation Oncology | Admitting: Radiation Oncology

## 2013-12-17 ENCOUNTER — Ambulatory Visit
Admission: RE | Admit: 2013-12-17 | Discharge: 2013-12-17 | Disposition: A | Discharge status: Home / Self Care | Payer: Medicare Other | Source: Ambulatory Visit | Attending: Radiation Oncology | Admitting: Radiation Oncology

## 2013-12-17 DIAGNOSIS — C50411 Malignant neoplasm of upper-outer quadrant of right female breast: Secondary | ICD-10-CM

## 2013-12-17 MED ORDER — RADIAPLEXRX EX GEL
Freq: Once | CUTANEOUS | Status: AC
  Administered 2013-12-17: 1 via TOPICAL

## 2013-12-17 MED ORDER — ALRA NON-METALLIC DEODORANT (RAD-ONC)
1.0000 "application " | Freq: Once | TOPICAL | Status: AC
  Administered 2013-12-17: 1 via TOPICAL

## 2013-12-17 NOTE — Addendum Note (Signed)
Encounter addended by: Arlyss Repress, RN on: 12/17/2013  3:27 PM<BR>     Documentation filed: Notes Section

## 2013-12-17 NOTE — Addendum Note (Signed)
Encounter addended by: Arlyss Repress, RN on: 12/17/2013  2:59 PM<BR>     Documentation filed: Orders

## 2013-12-17 NOTE — Progress Notes (Signed)
Routine of clinic reviewed with patient.Given Radiation Therapy and You booklet, skin care sheet, radiaplex and alra deodorant.Reviewd possible side effects to include fatigue, skin changes to include discoloration like sunburn, peeling and rash which should be minimal as patient to have only 17 treatments.Patient to continue exercise with in normal means of toleration, push fluids for hydration.No underwire bra during treatment.Patient very knowledable and able to verbalize back when to see doctor and how to take care of self during treatment.

## 2013-12-17 NOTE — Addendum Note (Signed)
Encounter addended by: Arlyss Repress, RN on: 12/17/2013  3:00 PM<BR>     Documentation filed: Inpatient MAR

## 2013-12-18 ENCOUNTER — Ambulatory Visit
Admission: RE | Admit: 2013-12-18 | Discharge: 2013-12-18 | Disposition: A | Discharge status: Home / Self Care | Payer: Medicare Other | Source: Ambulatory Visit | Attending: Radiation Oncology | Admitting: Radiation Oncology

## 2013-12-21 ENCOUNTER — Encounter: Payer: Self-pay | Admitting: Radiation Oncology

## 2013-12-21 ENCOUNTER — Ambulatory Visit
Admission: RE | Admit: 2013-12-21 | Discharge: 2013-12-21 | Disposition: A | Discharge status: Home / Self Care | Payer: Medicare Other | Source: Ambulatory Visit | Attending: Radiation Oncology | Admitting: Radiation Oncology

## 2013-12-21 VITALS — BP 136/71 | HR 59 | Temp 97.9°F | Ht 66.5 in | Wt 149.4 lb

## 2013-12-21 DIAGNOSIS — C50411 Malignant neoplasm of upper-outer quadrant of right female breast: Secondary | ICD-10-CM

## 2013-12-21 NOTE — Progress Notes (Signed)
Weekly Management Note:  Site: Right breast Current Dose:  1250  cGy Projected Dose: 4250  cGy, no boost  Narrative: The patient is seen today for routine under treatment assessment. CBCT/MVCT images/port films were reviewed. The chart was reviewed.   No complaints today. She did have some right sided chest wall discomfort which she attributes to lifting weights last week. She uses Radioplex gel.  Physical Examination:  Filed Vitals:   12/21/13 1210  BP: 136/71  Pulse: 59  Temp: 97.9 F (36.6 C)  .  Weight: 149 lb 6.4 oz (67.767 kg). No skin changes.  Impression: Tolerating radiation therapy well.  Plan: Continue radiation therapy as planned.

## 2013-12-21 NOTE — Progress Notes (Signed)
[name] has received 5 fractions to her right breast.  She reports tenderness of her right lateral breast since the start of radiation therapy, but denies any fatigue.  Mild redness noted in the upper inner portion.

## 2013-12-22 ENCOUNTER — Ambulatory Visit
Admission: RE | Admit: 2013-12-22 | Discharge: 2013-12-22 | Disposition: A | Discharge status: Home / Self Care | Payer: Medicare Other | Source: Ambulatory Visit | Attending: Radiation Oncology | Admitting: Radiation Oncology

## 2013-12-23 ENCOUNTER — Ambulatory Visit
Admission: RE | Admit: 2013-12-23 | Discharge: 2013-12-23 | Disposition: A | Discharge status: Home / Self Care | Payer: Medicare Other | Source: Ambulatory Visit | Attending: Radiation Oncology | Admitting: Radiation Oncology

## 2013-12-24 ENCOUNTER — Ambulatory Visit
Admission: RE | Admit: 2013-12-24 | Discharge: 2013-12-24 | Disposition: A | Discharge status: Home / Self Care | Payer: Medicare Other | Source: Ambulatory Visit | Attending: Radiation Oncology | Admitting: Radiation Oncology

## 2013-12-25 ENCOUNTER — Ambulatory Visit
Admission: RE | Admit: 2013-12-25 | Discharge: 2013-12-25 | Disposition: A | Discharge status: Home / Self Care | Payer: Medicare Other | Source: Ambulatory Visit | Attending: Radiation Oncology | Admitting: Radiation Oncology

## 2013-12-28 ENCOUNTER — Encounter: Payer: Self-pay | Admitting: Radiation Oncology

## 2013-12-28 ENCOUNTER — Ambulatory Visit
Admission: RE | Admit: 2013-12-28 | Discharge: 2013-12-28 | Disposition: A | Discharge status: Home / Self Care | Payer: Medicare Other | Source: Ambulatory Visit | Attending: Radiation Oncology | Admitting: Radiation Oncology

## 2013-12-28 VITALS — BP 136/71 | HR 64 | Temp 97.7°F | Resp 20 | Wt 149.4 lb

## 2013-12-28 DIAGNOSIS — C50411 Malignant neoplasm of upper-outer quadrant of right female breast: Secondary | ICD-10-CM

## 2013-12-28 NOTE — Progress Notes (Signed)
Weekly rad txs, rt breast 10 completed, very slight erythem, skin intact, not using radiaplex as yet,sugessted to start , appetite good,  11:57 AM

## 2013-12-28 NOTE — Progress Notes (Signed)
Weekly Management Note:  Site: Right breast Current Dose:  2500  cGy Projected Dose: 4250  cGy, no boost  Narrative: The patient is seen today for routine under treatment assessment. CBCT/MVCT images/port films were reviewed. The chart was reviewed.   She uses radiplex gel when necessary. No complaints today.  Physical Examination:  Filed Vitals:   12/28/13 1155  BP: 136/71  Pulse: 64  Temp: 97.7 F (36.5 C)  Resp: 20  .  Weight: 149 lb 6.4 oz (67.767 kg). Mild erythema and hyperpigmentation the skin with no areas of desquamation.  Impression: Tolerating radiation therapy well.  Plan: Continue radiation therapy as planned.

## 2013-12-29 ENCOUNTER — Ambulatory Visit
Admission: RE | Admit: 2013-12-29 | Discharge: 2013-12-29 | Disposition: A | Discharge status: Home / Self Care | Payer: Medicare Other | Source: Ambulatory Visit | Attending: Radiation Oncology | Admitting: Radiation Oncology

## 2013-12-30 ENCOUNTER — Ambulatory Visit
Admission: RE | Admit: 2013-12-30 | Discharge: 2013-12-30 | Disposition: A | Discharge status: Home / Self Care | Payer: Medicare Other | Source: Ambulatory Visit | Attending: Radiation Oncology | Admitting: Radiation Oncology

## 2013-12-31 ENCOUNTER — Ambulatory Visit
Admission: RE | Admit: 2013-12-31 | Discharge: 2013-12-31 | Disposition: A | Discharge status: Home / Self Care | Payer: Medicare Other | Source: Ambulatory Visit | Attending: Radiation Oncology | Admitting: Radiation Oncology

## 2014-01-01 ENCOUNTER — Ambulatory Visit
Admission: RE | Admit: 2014-01-01 | Discharge: 2014-01-01 | Disposition: A | Discharge status: Home / Self Care | Payer: Medicare Other | Source: Ambulatory Visit | Attending: Radiation Oncology | Admitting: Radiation Oncology

## 2014-01-04 ENCOUNTER — Ambulatory Visit: Payer: Medicare Other

## 2014-01-05 ENCOUNTER — Ambulatory Visit: Payer: Medicare Other

## 2014-01-06 ENCOUNTER — Ambulatory Visit: Payer: Medicare Other

## 2014-01-07 ENCOUNTER — Ambulatory Visit
Admission: RE | Admit: 2014-01-07 | Discharge: 2014-01-07 | Disposition: A | Discharge status: Home / Self Care | Payer: Medicare Other | Source: Ambulatory Visit | Attending: Radiation Oncology | Admitting: Radiation Oncology

## 2014-01-07 ENCOUNTER — Encounter: Payer: Self-pay | Admitting: Radiation Oncology

## 2014-01-07 VITALS — BP 120/73 | HR 54 | Temp 97.4°F | Resp 20 | Wt 150.1 lb

## 2014-01-07 DIAGNOSIS — C50411 Malignant neoplasm of upper-outer quadrant of right female breast: Secondary | ICD-10-CM

## 2014-01-07 NOTE — Progress Notes (Signed)
Department of Radiation Oncology  Phone:  [phone] Fax:        [phone]  Weekly Treatment Note    Name: [name] Date: 01/07/2014 MRN: [id number] DOB: [birth year]   Current dose: 37.5 Gy  Current fraction: 15   MEDICATIONS: Current Outpatient Prescriptions  Medication Sig Dispense Refill  . acetaminophen (TYLENOL) 650 MG CR tablet Take 650 mg by mouth every 8 (eight) hours as needed for pain.      [name] ALPRAZolam (XANAX) 0.5 MG tablet Take 0.5 mg by mouth at bedtime as needed for anxiety.      . calcium carbonate (OS-CAL) 600 MG TABS tablet Take 600 mg by mouth 2 (two) times daily with a meal.      . cholecalciferol (VITAMIN D) 400 UNITS TABS tablet Take 2,000 Units by mouth.       . cycloSPORINE (RESTASIS) 0.05 % ophthalmic emulsion 1 drop 2 (two) times daily.      [name] diltiazem (CARDIZEM SR) 120 MG 12 hr capsule Take 120 mg by mouth 2 (two) times daily.      . fexofenadine (ALLEGRA) 180 MG tablet Take 180 mg by mouth daily.      . flecainide (TAMBOCOR) 50 MG tablet       . fluticasone (FLONASE) 50 MCG/ACT nasal spray Place into both nostrils daily.      [name] glucosamine-chondroitin 500-400 MG tablet Take 1 tablet by mouth 3 (three) times daily.      [name] guaiFENesin (MUCINEX) 600 MG 12 hr tablet Take by mouth 2 (two) times daily.      . hyaluronate sodium (RADIAPLEXRX) GEL Apply 1 application topically 2 (two) times daily. Apply to affected skin area after rad tx and bedtime, daily weekends too      . Melatonin 10 MG TABS Take by mouth.      . METRONIDAZOLE EX Apply topically.      . non-metallic deodorant Jethro Poling) MISC Apply 1 application topically daily.      Vladimir Faster Glycol-Propyl Glycol (SYSTANE) 0.4-0.3 % GEL Apply to eye.      . sodium chloride (OCEAN) 0.65 % nasal spray Place 1 spray into the nose as needed for congestion.      . trolamine salicylate (ASPERCREME) 10 % cream Apply 1 application topically as needed for muscle pain.      . vitamin E 400 UNIT capsule  Take 400 Units by mouth daily.      [name] warfarin (COUMADIN) 5 MG tablet        No current facility-administered medications for this encounter.     ALLERGIES: Codeine   LABORATORY DATA:  Lab Results  Component Value Date   WBC 6.1 11/24/2013   HGB 12.4 11/24/2013   HCT 36.8 11/24/2013   MCV 95.8 11/24/2013   PLT 227 11/24/2013   Lab Results  Component Value Date   NA 141 11/24/2013   K 4.0 11/24/2013   CL 104 11/09/2013   CO2 26 11/24/2013   Lab Results  Component Value Date   ALT 14 11/24/2013   AST 20 11/24/2013   ALKPHOS 66 11/24/2013   BILITOT 0.33 11/24/2013     NARRATIVE: Katherine Powell was seen today for weekly treatment management. The chart was checked and the patient's films were reviewed. The patient is doing well at this time. She is using skin cream on occasion but states that she really has not had any substantial skin irritation except for some mild dermatitis. No real pain. Some  fatigue at times.  PHYSICAL EXAMINATION: weight is 150 lb 1.6 oz (68.085 kg). Her oral temperature is 97.4 F (36.3 C). Her blood pressure is 120/73 and her pulse is 54. Her respiration is 20.      the skin looks very good at this point. Some dermatitis present without any desquamation.  ASSESSMENT: The patient is doing satisfactorily with treatment.  PLAN: We will continue with the patient's radiation treatment as planned.

## 2014-01-07 NOTE — Progress Notes (Signed)
Weekly rad txs, rt breast, 15/17/15, slight erythema on breast,starting  To get some dermatitis,  Uses radiaplex occasionally, no c/o pain or discomfort, appetite good, just tired at times, husband just had surgery at [facility] 12:20 PM

## 2014-01-08 ENCOUNTER — Ambulatory Visit
Admission: RE | Admit: 2014-01-08 | Discharge: 2014-01-08 | Disposition: A | Discharge status: Home / Self Care | Payer: Medicare Other | Source: Ambulatory Visit | Attending: Radiation Oncology | Admitting: Radiation Oncology

## 2014-01-11 ENCOUNTER — Ambulatory Visit
Admission: RE | Admit: 2014-01-11 | Discharge: 2014-01-11 | Disposition: A | Discharge status: Home / Self Care | Payer: Medicare Other | Source: Ambulatory Visit | Attending: Radiation Oncology | Admitting: Radiation Oncology

## 2014-01-11 ENCOUNTER — Encounter: Payer: Self-pay | Admitting: Radiation Oncology

## 2014-01-11 ENCOUNTER — Telehealth: Payer: Self-pay | Admitting: *Deleted

## 2014-01-11 VITALS — BP 123/76 | HR 62 | Temp 97.6°F | Resp 20 | Wt 149.9 lb

## 2014-01-11 DIAGNOSIS — C50411 Malignant neoplasm of upper-outer quadrant of right female breast: Secondary | ICD-10-CM

## 2014-01-11 NOTE — Progress Notes (Signed)
Weekly Management Note:  Site: Right breast Current Dose:  4250  cGy Projected Dose: 4250  cGy  Narrative: The patient is seen today for routine under treatment assessment. CBCT/MVCT images/port films were reviewed. The chart was reviewed.   She is without complaints today. She has not yet have a followup appointment with Dr. Jana Hakim. She uses Radioplex gel.  Physical Examination:  Filed Vitals:   01/11/14 1125  BP: 123/76  Pulse: 62  Temp: 97.6 F (36.4 C)  Resp: 20  .  Weight: 149 lb 14.4 oz (67.994 kg). There is mild to moderate erythema/hyperpigmentation the skin along the right breast with a papular radiation dermatitis along the upper inner quadrant of the right breast. No areas of desquamation.  Impression: Tolerating radiation therapy well. Radiation therapy completed today.  Plan: Followup visit with me in one month. She is to see Dr. Jana Hakim for a followup visit to discuss adjuvant/chemoprevention antiestrogen therapy.

## 2014-01-11 NOTE — Progress Notes (Signed)
[facility] End of Treatment Note  Name:Katherine Powell  Date: 01/11/2014 YQM:[id number] DOB:1932-08-13   Status:outpatient    CC: Cari Caraway, MD  Dr. Coralie Keens, Dr. Gunnar Bulla Magrinat  REFERRING PHYSICIAN:   Dr. Coralie Keens   DIAGNOSIS: Stage I (T1c N0 M0) invasive ductal carcinoma/DCIS of the right breast   INDICATION FOR TREATMENT: Curative   TREATMENT DATES: 12/15/2013 through 01/11/2014                          SITE/DOSE:  Right breast 4250 cGy in 17 sessions (hypo-fractionated), no boost                          BEAMS/ENERGY: Mixed 6 MV/10 MV photons, tangential fields                  NARRATIVE:  The patient tolerated her treatment beautifully with no significant skin toxicity by completion of therapy.                           PLAN: Routine followup in one month. Patient instructed to call if questions or worsening complaints in interim. I explained to the patient that she needs to see Dr. Jana Hakim for a followup visit to discuss adjuvant/chemotherapy preventative antiestrogen therapy. Of note is that she had been on estrogen replacement therapy for 35-40 years and this was discontinued preoperatively. She tells me that she is not excited about antiestrogen therapy, and I told her that she still needs to meet with Dr. Jana Hakim.

## 2014-01-11 NOTE — Progress Notes (Signed)
Pt completed treatment today; gave her FU card. She will call from home so she'll have her calendar. Pt applying Radiaplex to right breast for radiation dermatitis, slight pinkness of skin in treatment area. She is napping in afternoons, no loss of appetite.

## 2014-01-11 NOTE — Telephone Encounter (Signed)
Called Rozetta Nunnery to arrange a fu visit for this patient with Dr. Jana Hakim at the end of Feb., lvm for a return call

## 2014-01-12 ENCOUNTER — Encounter: Payer: Self-pay | Admitting: *Deleted

## 2014-01-12 ENCOUNTER — Other Ambulatory Visit: Payer: Self-pay | Admitting: Oncology

## 2014-01-12 ENCOUNTER — Telehealth: Payer: Self-pay | Admitting: *Deleted

## 2014-01-12 ENCOUNTER — Other Ambulatory Visit: Payer: Self-pay | Admitting: *Deleted

## 2014-01-12 NOTE — Telephone Encounter (Signed)
CALLED PATIENT TO INFORM OF FU VISIT FOR 01-26-14 - @ 8:30 AM WITH DR. MAGRINAT, SPOKE WITH PATIENT AND SHE IS AWARE OF THIS APPT.

## 2014-01-12 NOTE — Telephone Encounter (Signed)
This RN was contacted by Enid Derry in [place] stating pt needs to see MD for post radiation follow up.  Appointment given for last week in Feb.  Message then received from [place] stating pt cannot make given time.  This RN placed a POF for follow up- Per MD dictation noted need to obtain a bone density to discuss use of aromatase inhibitor therapy.  This RN noted post placing POF MD entry early today showing appointment request.  This RN will cancer her POF.

## 2014-01-12 NOTE — Progress Notes (Unsigned)
Patient has completed XRT. We will see her late Feb to start antiestrogens. Since she is on warfarin likely that will be anastrozole. She will need an updated bone density before her return visit w me in late April or early May to assess need for bisphosphonates, if any.

## 2014-01-13 ENCOUNTER — Other Ambulatory Visit: Payer: Self-pay | Admitting: Oncology

## 2014-01-13 ENCOUNTER — Telehealth: Payer: Self-pay | Admitting: *Deleted

## 2014-01-13 NOTE — Telephone Encounter (Signed)
Lm gv appt for 01/27/14@ 1:45pm. Made the pt aware that i will mail a letter/avs...td

## 2014-01-26 ENCOUNTER — Ambulatory Visit: Payer: Medicare Other | Admitting: Oncology

## 2014-01-27 ENCOUNTER — Ambulatory Visit (HOSPITAL_BASED_OUTPATIENT_CLINIC_OR_DEPARTMENT_OTHER): Payer: Medicare Other | Admitting: Physician Assistant

## 2014-01-27 ENCOUNTER — Encounter: Payer: Self-pay | Admitting: Physician Assistant

## 2014-01-27 ENCOUNTER — Telehealth: Payer: Self-pay | Admitting: Oncology

## 2014-01-27 VITALS — BP 124/68 | HR 60 | Temp 97.8°F | Resp 18 | Ht 66.5 in | Wt 148.5 lb

## 2014-01-27 DIAGNOSIS — N951 Menopausal and female climacteric states: Secondary | ICD-10-CM

## 2014-01-27 DIAGNOSIS — Z780 Asymptomatic menopausal state: Secondary | ICD-10-CM | POA: Insufficient documentation

## 2014-01-27 DIAGNOSIS — C50411 Malignant neoplasm of upper-outer quadrant of right female breast: Secondary | ICD-10-CM

## 2014-01-27 DIAGNOSIS — Z853 Personal history of malignant neoplasm of breast: Secondary | ICD-10-CM

## 2014-01-27 DIAGNOSIS — C50919 Malignant neoplasm of unspecified site of unspecified female breast: Secondary | ICD-10-CM

## 2014-01-27 DIAGNOSIS — Z170 Estrogen receptor positive status [ER+]: Secondary | ICD-10-CM

## 2014-01-27 MED ORDER — ANASTROZOLE 1 MG PO TABS: 1.0000 mg | ORAL_TABLET | Freq: Every day | ORAL | Status: DC

## 2014-01-27 MED ORDER — GABAPENTIN 300 MG PO CAPS: 300.0000 mg | ORAL_CAPSULE | Freq: Every evening | ORAL | Status: DC | PRN

## 2014-01-28 NOTE — Progress Notes (Signed)
[facility]  [phone] [phone]     ID: Katherine Powell OB: 1932/05/06  MR#: [id number]  KGY#:[id number]  PCP: Cari Caraway, MD GYN:   SUCoralie Keens OTHER MD: Arloa Koh, Christene Slates, Rob Hickman, Daneen Schick  CHIEF COMPLAINT:  Hx of Right Breast Cancer   HISTORY OF PRESENT ILLNESS: Katherine Powell had routine screening mammography at [facility] 10/14/2013 showing a 1 cm irregular mass in the right breast. Right digital mammography and ultrasonography of the right breast 10/21/2013 showed a mass with indistinct margins at the 12:00 position which by ultrasound measured 1 cm, and was lobulated. Biopsy was recommended, but the patient opted to proceed directly with lumpectomy, and this was performed 11/09/2013. The final pathology ([id number]) showed an invasive ductal carcinoma, grade 2, measuring 1.1 cm, with low-grade ductal carcinoma in situ. The tumor was estrogen receptor 100% positive, and progesterone receptor 94% positive, both with strong staining intensity. The MIB-1 was 10%. There was no HER-2 amplification with a ratio by CISH of 0.90, and the number per cell being 1.80.  The patient's subsequent history is as detailed below   INTERVAL HISTORY: Katherine Powell returns alone today for followup of her right breast carcinoma. Since her last appointment here in December, she has received radiation therapy, completed on 01/11/2014. She tolerated treatment well, with only some mild residual skin changes and some mild fatigue. She is here today to discuss initiation of anti-estrogen therapy.  Overall, Katherine Powell has been doing well. Her husband is currently in rehabilitation status post his fourth knee replacement, so this is keeping Katherine Powell very busy.  REVIEW OF SYSTEMS: Katherine Powell has had no recent illnesses and denies any fevers or chills. She does have significant hot flashes already, secondary to menopause. She still has a slight rash on her right upper  chest wall status post radiation, but tells me this is gradually improving. She denies any additional skin changes or rashes. She denies any signs of abnormal bleeding, and specifically has had no abnormal vaginal bleeding. She denies any vaginal dryness. She has a runny nose, but no cough, phlegm production, shortness of breath, or orthopnea. She has an occasional irregular heartbeat for which she is on Coumadin. She denies any chest pain or pressure. Her appetite is good, and she's had no problems with nausea or change in bowel or bladder habits. She does have a history of thyroid nodules and notes she occasionally has some difficulty swallowing. This is followed by her primary care physician. She denies any abnormal headaches or dizziness. She has some mild joint pain associated with arthritis. She has some mild exciting which she tells me is well-controlled, but she denies any depression or suicidal ideation.  Detailed review of systems is otherwise stable and noncontributory.   PAST MEDICAL HISTORY: Past Medical History  Diagnosis Date  . Arrhythmia     atrial fibrillation/  on   . Hypertension   . Rosacea   . Atrial fibrillation   . Postmenopausal   . Sleep disturbance   . Thyroid disease   . Urinary incontinence   . Contact lens/glasses fitting     contact rt eye  . Seasonal allergies   . Arthritis   . Anxiety   . Breast cancer 11/09/13    right upper outer    PAST SURGICAL HISTORY: Past Surgical History  Procedure Laterality Date  . Cystocele repair  2008  . Rectocele repair  2008  . Tonsillectomy    . Cataract extraction  2013  both  . Appendectomy  1954  . Eye surgery      cataracts-plugs for eyes  . Back surgery      lumb-2005  . Breast lumpectomy with needle localization Right 11/09/2013    Procedure: BREAST LUMPECTOMY WITH NEEDLE LOCALIZATION;  Surgeon: Harl Bowie, MD;  Location: [facility];  Service: General;  Laterality: Right;  .  Abdominal hysterectomy  1973    endometriosis  . Bilateral oophorectomy      benign tumor on ovary    FAMILY HISTORY Family History  Problem Relation Age of Onset  . Alzheimer's disease Mother    the patient's father died from COPD at age 47 in the setting of tobacco abuse. The patient's mother died at the age of 75 with Alzheimer's disease. The patient had no brothers, one sister. There is no history of breast or ovarian cancer in the family to her knowledge  GYNECOLOGIC HISTORY:  Menarche age 56, first live birth age 63. The patient is GX P3. She underwent simple hysterectomy in 1973 and took hormone replacement (initially Prempro later estrogens only) until December 2014. The patient subsequently underwent bilateral salpingo-oophorectomy for what proved to be a benign ovarian mass.  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 Her husband Katherine Powell was an Chief Financial Officer for [employer]. Their son Katherine Powell lives in [place], [place] where he works in Engineer, technical sales. Daughter Katherine Powell lives in [place] where she works as a Designer, jewellery in an independent clinic. Her husband is a Engineer, drilling.  Daughter Katherine Powell lives in [place]. She works as a Tree surgeon. The patient has 7 grandchildren. She is a Tourist information centre manager.    ADVANCED DIRECTIVES: In place   HEALTH MAINTENANCE:  (Updated February 2015) History  Substance Use Topics  . Smoking status: Never Smoker   . Smokeless tobacco: Never Used  . Alcohol Use: No     Colonoscopy: Not on file  PAP:  Status post hysterectomy  Bone density: At [facility] 09/27/2010, normal  Lipid panel: Not on file/Dr. Addison Lank    Allergies  Allergen Reactions  . Codeine     nausea    Current Outpatient Prescriptions  Medication Sig Dispense Refill  . ALPRAZolam (XANAX) 0.5 MG tablet Take 0.5 mg by mouth at bedtime as needed for anxiety.      . calcium carbonate  (OS-CAL) 600 MG TABS tablet Take 600 mg by mouth 2 (two) times daily with a meal.      . cholecalciferol (VITAMIN D) 400 UNITS TABS tablet Take 2,000 Units by mouth.       . cycloSPORINE (RESTASIS) 0.05 % ophthalmic emulsion 1 drop 2 (two) times daily.      [name] diltiazem (CARDIZEM SR) 120 MG 12 hr capsule Take 120 mg by mouth 2 (two) times daily.      . fexofenadine (ALLEGRA) 180 MG tablet Take 180 mg by mouth daily.      . flecainide (TAMBOCOR) 50 MG tablet       . fluticasone (FLONASE) 50 MCG/ACT nasal spray Place into both nostrils daily.      [name] glucosamine-chondroitin 500-400 MG tablet Take 1 tablet by mouth 3 (three) times daily.      . Melatonin 5 MG CAPS Take 5 mg by mouth. Take at bedtime.      [name] METRONIDAZOLE EX Apply topically.      Vladimir Faster Glycol-Propyl Glycol (  SYSTANE) 0.4-0.3 % GEL Apply to eye.      . trolamine salicylate (ASPERCREME) 10 % cream Apply 1 application topically as needed for muscle pain.      . vitamin E 400 UNIT capsule Take 400 Units by mouth daily.      [name] warfarin (COUMADIN) 5 MG tablet 5 mg every morning.       [name] acetaminophen (TYLENOL) 650 MG CR tablet Take 650 mg by mouth every 8 (eight) hours as needed for pain.      [name] anastrozole (ARIMIDEX) 1 MG tablet Take 1 tablet (1 mg total) by mouth daily.  90 tablet  3  . gabapentin (NEURONTIN) 300 MG capsule Take 1 capsule (300 mg total) by mouth at bedtime as needed (hot flashes).  90 capsule  3  . guaiFENesin (MUCINEX) 600 MG 12 hr tablet Take by mouth 2 (two) times daily.      . hyaluronate sodium (RADIAPLEXRX) GEL Apply 1 application topically 2 (two) times daily. Apply to affected skin area after rad tx and bedtime, daily weekends too      . non-metallic deodorant (ALRA) MISC Apply 1 application topically daily.      . sodium chloride (OCEAN) 0.65 % nasal spray Place 1 spray into the nose as needed for congestion.       No current facility-administered medications for this visit.    OBJECTIVE: White female  who appears well and is in no acute distress Filed Vitals:   01/27/14 1404  BP: 124/68  Pulse: 60  Temp: 97.8 F (36.6 C)  Resp: 18     Body mass index is 23.61 kg/(m^2).    ECOG FS:0 - Asymptomatic Filed Weights   01/27/14 1404  Weight: 148 lb 8 oz (67.359 kg)   Physical Exam: HEENT:  Sclerae anicteric.  Oropharynx clear, pink, and moist. Neck supple, trachea midline. Thyromegaly noted on exam.  NODES:  No cervical or supraclavicular lymphadenopathy palpated.  BREAST EXAM:  Right breast is status post lumpectomy and radiation therapy, well-healed incision with no suspicious nodularity and no evidence of local recurrence. There is some slight erythema in the upper right chest wall consistent with radiation changes. No desquamation noted. Left breast is unremarkable. Axillae are benign bilaterally, no palpable lymphadenopathy. LUNGS:  Clear to auscultation bilaterally.  No wheezes or rhonchi HEART:  Regular rate and rhythm. No murmur  ABDOMEN:  Soft, nontender.  Positive bowel sounds.  MSK:  No focal spinal tenderness to palpation. Good range of motion bilaterally in the upper extremities. EXTREMITIES:  No peripheral edema.   SKIN:  Benign with no skin rashes or skin lesions other than the radiation changes as noted above. No excessive ecchymoses. No petechiae. No pallor. NEURO:  Nonfocal. Well oriented.  Positive affect.    LAB RESULTS:  Lab Results  Component Value Date   WBC 6.1 11/24/2013   NEUTROABS 3.7 11/24/2013   HGB 12.4 11/24/2013   HCT 36.8 11/24/2013   MCV 95.8 11/24/2013   PLT 227 11/24/2013      Chemistry      Component Value Date/Time   NA 141 11/24/2013 1554   NA 139 11/09/2013 1200   K 4.0 11/24/2013 1554   K 3.5 11/09/2013 1200   CL 104 11/09/2013 1200   CO2 26 11/24/2013 1554   CO2 28 11/09/2013 1200   BUN 14.2 11/24/2013 1554   BUN 14 11/09/2013 1200   CREATININE 0.9 11/24/2013 1554   CREATININE 0.89 11/09/2013 1200  Component Value Date/Time    CALCIUM 9.3 11/24/2013 1554   CALCIUM 8.9 11/09/2013 1200   ALKPHOS 66 11/24/2013 1554   AST 20 11/24/2013 1554   ALT 14 11/24/2013 1554   BILITOT 0.33 11/24/2013 1554      STUDIES: Most recent bone density was in October 2011 at [facility]. Results for in the "low normal range".  Next bilateral mammogram will be due in November 2015 at [facility].   ASSESSMENT: 80 y.o. Katherine Powell woman  (1) Status post right lumpectomy 11/09/2013 for a pT1c NX, stage IA invasive ductal carcinoma, grade 2, estrogen receptor 100% positive, progesterone receptor 94% positive, with an MIB-1 of 10%, and no HER-2 amplification.  (2) status post adjuvant radiation therapy, completed on 01/11/2014  (3) initiating anastrozole in late February 2015.  (4) the patient is status post hysterectomy and bilateral salpingo-oophorectomy  PLAN: Overall, Mackenzy is doing very well, and she is scheduled to followup with Dr. Valere Dross in 2 weeks, March 10. She is ready to initiate antiestrogen therapy. Due to the fact that she's currently on Coumadin, we will certainly avoid tamoxifen to an we'll begin her on an aromatase inhibitor.  The majority of our 40 minute appointment today was spent reviewing aromatase inhibitors, their benefits, and they are associated toxicities/side effects. We are going to start with anastrozole, 1 mg daily. She was given written information about the drug, including possible side effects such as hot flashes, increased joint pain, vaginal dryness, and an increased risk of osteoporosis. She does walk at least 4 days weekly, and also takes calcium and vitamin D, all of which she will continue. We will repeat her bone density in the next couple of months for a new baseline, and to assess whether or not we need to initiate bisphosphonate therapy.  Mekaila is already having significant hot flashes, especially at night. We discussed trying gabapentin, 300 mg at bedtime. She would like to try this, but we also  discussed the fact that she will begin the anastrozole first, continue for 2 weeks to assess early tolerance, then begin the gabapentin.  Illyana will return in approximately 8 weeks to followup with Dr. Jana Hakim. At that time, he will assess her tolerance of the anastrozole, review the results of her bone density scan, and set her up for routine followup visits. All of the above was reviewed with Katherine Powell and she voices her understanding and agreement with our plan. She also knows to call with any changes or problems prior to her next appointment.  Katherine Tigges, PA-C    01/27/2014

## 2014-02-03 ENCOUNTER — Encounter: Payer: Self-pay | Admitting: *Deleted

## 2014-02-09 ENCOUNTER — Encounter: Payer: Self-pay | Admitting: Radiation Oncology

## 2014-02-09 ENCOUNTER — Ambulatory Visit
Admission: RE | Admit: 2014-02-09 | Discharge: 2014-02-09 | Disposition: A | Discharge status: Home / Self Care | Payer: Medicare Other | Source: Ambulatory Visit | Attending: Radiation Oncology | Admitting: Radiation Oncology

## 2014-02-09 VITALS — BP 144/74 | HR 58 | Temp 97.6°F | Resp 20

## 2014-02-09 DIAGNOSIS — C50411 Malignant neoplasm of upper-outer quadrant of right female breast: Secondary | ICD-10-CM

## 2014-02-09 NOTE — Progress Notes (Signed)
Followup note:  [name] returns today approximately 1 month following completion of radiation therapy following conservative surgery in the management of her T1c N0 invasive ductal/DCIS of the right breast. She is without complaints today although she does admit to some discomfort in her right breast when doing yoga. She is on anastrozole and does have some hot flashes. She was placed on gabapentin for hot flashes, but she stopped this because of fatigue. She is getting set to have a bone density and a followup visit with Dr. Jana Hakim on May 7.  Physical examination: Alert and oriented. Filed Vitals:   02/09/14 1102  BP: 144/74  Pulse: 58  Temp: 97.6 F (36.4 C)  Resp: 20   Nodes: There is no palpable cervical, supraclavicular, or axillary lymphadenopathy. Breasts: There is residual hyperpigmentation/erythema along the right breast with minimal thickening. No masses are appreciated. Left breast without masses or lesions. Extremities: Without edema.  Impression: Satisfactory progress. She typically has mammography in November, she can have repeat mammograms in November 2015 at [facility]. I've not scheduled her for a formal followup visit knowing that she will be followed by Dr. Jana Hakim and his team.

## 2014-02-09 NOTE — Progress Notes (Signed)
Pt denies pain, loss of appetite. She states when she does yoga she has some discomfort in her right breast. Pt is still somewhat fatigued. She is taking Anastrozole daily, reports hot flashes. She states she was taking Gabapentin but stopped due to causing her to "feel bad".

## 2014-02-26 ENCOUNTER — Other Ambulatory Visit: Payer: Self-pay | Admitting: *Deleted

## 2014-03-22 ENCOUNTER — Telehealth: Payer: Self-pay | Admitting: Interventional Cardiology

## 2014-03-22 ENCOUNTER — Other Ambulatory Visit: Payer: Self-pay

## 2014-03-22 MED ORDER — RIVAROXABAN 20 MG PO TABS: 20.0000 mg | ORAL_TABLET | Freq: Every day | ORAL | Status: DC

## 2014-03-22 NOTE — Telephone Encounter (Signed)
returned pt call.lmom  for pt to call back 

## 2014-03-22 NOTE — Telephone Encounter (Signed)
New Prob    Pt states she has been experiencing worsening A-Fib. Would like to speak to someone regarding following up possibly. Please call.

## 2014-03-22 NOTE — Telephone Encounter (Signed)
pt called back.pt sts that it is usual for her to have 1 afib episode a month.pt is taking anti arrhytmic medications as prescribed.pt sts that in the last month she has went in to afib 2x.pt sts that her episodes of afib had improved since restarting flecainide.pt has switched her anti coag from coumadin to xarelto 20mg  pt med list updated .pt sts that her bp is normal but during episodes her heartrate goes up to the 120's.pt sts that she has been feeling "zapped lately" pt is currently receiving radiology treatments for breat cancer.adv pt I will fwd Dr.Smith a message and call back with her recommendations.pt agreeable with plan and verbalized understanding

## 2014-03-23 NOTE — Telephone Encounter (Signed)
There could be a component related to stress and radiation effect. Give it time

## 2014-03-24 NOTE — Telephone Encounter (Signed)
Follow up ° ° ° ° ° °Returning Lisa's call °

## 2014-03-24 NOTE — Telephone Encounter (Signed)
called ton give pt Dr.Smith recommendations.lmom for pt to call back

## 2014-03-25 ENCOUNTER — Other Ambulatory Visit: Payer: Self-pay | Admitting: *Deleted

## 2014-03-25 NOTE — Telephone Encounter (Signed)
pt given Dr.Smith's recommendations.There could be a component related to stress and radiation effect. Give it time .pt adv to cal back if afib episodes become more frequent.pt verbalized understanding.

## 2014-03-31 ENCOUNTER — Telehealth: Payer: Self-pay | Admitting: *Deleted

## 2014-03-31 NOTE — Telephone Encounter (Signed)
Spoke to pt concerning f/u with Dr. Jana Hakim.  Pt r/s with Dr. Marko Plume on 04/08/14 at 10:00lab/10:30.  Confirmed appt date and time.  Pt denies further needs at this time.

## 2014-04-04 ENCOUNTER — Other Ambulatory Visit: Payer: Self-pay | Admitting: Oncology

## 2014-04-04 DIAGNOSIS — C50411 Malignant neoplasm of upper-outer quadrant of right female breast: Secondary | ICD-10-CM

## 2014-04-08 ENCOUNTER — Ambulatory Visit: Payer: Medicare Other | Admitting: Oncology

## 2014-04-08 ENCOUNTER — Telehealth: Payer: Self-pay | Admitting: Oncology

## 2014-04-08 ENCOUNTER — Other Ambulatory Visit (HOSPITAL_BASED_OUTPATIENT_CLINIC_OR_DEPARTMENT_OTHER): Payer: Medicare Other

## 2014-04-08 ENCOUNTER — Ambulatory Visit (HOSPITAL_BASED_OUTPATIENT_CLINIC_OR_DEPARTMENT_OTHER): Payer: Medicare Other | Admitting: Oncology

## 2014-04-08 ENCOUNTER — Other Ambulatory Visit: Payer: Medicare Other

## 2014-04-08 ENCOUNTER — Encounter: Payer: Self-pay | Admitting: Oncology

## 2014-04-08 VITALS — BP 135/75 | HR 97 | Temp 97.6°F | Resp 17 | Ht 66.5 in | Wt 149.2 lb

## 2014-04-08 DIAGNOSIS — C50911 Malignant neoplasm of unspecified site of right female breast: Secondary | ICD-10-CM

## 2014-04-08 DIAGNOSIS — C50411 Malignant neoplasm of upper-outer quadrant of right female breast: Secondary | ICD-10-CM

## 2014-04-08 DIAGNOSIS — I4891 Unspecified atrial fibrillation: Secondary | ICD-10-CM

## 2014-04-08 DIAGNOSIS — C50919 Malignant neoplasm of unspecified site of unspecified female breast: Secondary | ICD-10-CM

## 2014-04-08 DIAGNOSIS — M899 Disorder of bone, unspecified: Secondary | ICD-10-CM

## 2014-04-08 DIAGNOSIS — M949 Disorder of cartilage, unspecified: Secondary | ICD-10-CM

## 2014-04-08 DIAGNOSIS — Z170 Estrogen receptor positive status [ER+]: Secondary | ICD-10-CM

## 2014-04-08 DIAGNOSIS — I10 Essential (primary) hypertension: Secondary | ICD-10-CM

## 2014-04-08 LAB — COMPREHENSIVE METABOLIC PANEL (CC13)
ALT: 7 U/L (ref 0–55)
AST: 15 U/L (ref 5–34)
Albumin: 3.5 g/dL (ref 3.5–5.0)
Alkaline Phosphatase: 75 U/L (ref 40–150)
Anion Gap: 10 mEq/L (ref 3–11)
BUN: 18.9 mg/dL (ref 7.0–26.0)
CO2: 25 mEq/L (ref 22–29)
Calcium: 9.8 mg/dL (ref 8.4–10.4)
Chloride: 106 mEq/L (ref 98–109)
Creatinine: 1 mg/dL (ref 0.6–1.1)
Glucose: 92 mg/dl (ref 70–140)
Potassium: 4.3 mEq/L (ref 3.5–5.1)
Sodium: 141 mEq/L (ref 136–145)
Total Bilirubin: 0.37 mg/dL (ref 0.20–1.20)
Total Protein: 6.7 g/dL (ref 6.4–8.3)

## 2014-04-08 LAB — CBC WITH DIFFERENTIAL/PLATELET
BASO%: 0.4 % (ref 0.0–2.0)
Basophils Absolute: 0 10*3/uL (ref 0.0–0.1)
EOS%: 2.7 % (ref 0.0–7.0)
Eosinophils Absolute: 0.2 10*3/uL (ref 0.0–0.5)
HCT: 34.5 % — ABNORMAL LOW (ref 34.8–46.6)
HGB: 11.6 g/dL (ref 11.6–15.9)
LYMPH%: 16.2 % (ref 14.0–49.7)
MCH: 31.9 pg (ref 25.1–34.0)
MCHC: 33.6 g/dL (ref 31.5–36.0)
MCV: 94.9 fL (ref 79.5–101.0)
MONO#: 0.5 10*3/uL (ref 0.1–0.9)
MONO%: 8.2 % (ref 0.0–14.0)
NEUT#: 4.6 10*3/uL (ref 1.5–6.5)
NEUT%: 72.5 % (ref 38.4–76.8)
Platelets: 281 10*3/uL (ref 145–400)
RBC: 3.64 10*6/uL — ABNORMAL LOW (ref 3.70–5.45)
RDW: 12.9 % (ref 11.2–14.5)
WBC: 6.3 10*3/uL (ref 3.9–10.3)
lymph#: 1 10*3/uL (ref 0.9–3.3)

## 2014-04-08 NOTE — Telephone Encounter (Signed)
per pof sch pt appt-gave pt copy of appt

## 2014-04-08 NOTE — Patient Instructions (Signed)
Please have Dr Addison Lank repeat your CBC in 2-3 months, as your hemoglobin is just a little lower and you are on the xarelto  You can try 1/2 of the gabapentin 300 mg capsule at night for hot flashes  Continue good calcium, Vitamin D and exercise

## 2014-04-08 NOTE — Progress Notes (Signed)
OFFICE PROGRESS NOTE   04/08/2014   Physicians: Gus Magrinat, Cari Caraway, Teena Irani, Coralie Keens, Arloa Koh, Daneen Schick  INTERVAL HISTORY:  Patient is seen for Dr Jana Hakim, alone for visit and for first time by this MD, in follow up of adjuvant anastrazole for stage IA grade 2 ER PR + HER-2 negative invasive ductal carcinoma of right breast which was diagnosed 11-2013 and for which she has been on anastrozole since February 2015. She saw Dr Valere Dross 01-2014, prn follow up there. She will see Dr Ninfa Linden for 6 month follow up in 05-2014. She sees PCP Dr Addison Lank every 6 months, next in 10-2014. Last bilateral mammograms were at [facility] 10-14-13. She had DEXA scan at [facility] [place] 03-18-14 with osteopenia, lowest T score -1.3 at hip. She is on calcium + D and exercises regularly.  Patient has felt very well other than intermittent hot flashes, which she is tolerating. She had been on estrogen x35 years prior to breast cancer diagnosis, so had never had hot flashes previously.  She is post hysterectomy and BSO remotely. 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 Per EMR, her daughter is NP.  ONCOLOGIC HISTORY Shikita had routine screening mammography at [facility] 10/14/2013 showing a 1 cm irregular mass in the right breast. Right digital mammography and ultrasonography of the right breast 10/21/2013 showed a mass with indistinct margins at the 12:00 position which by ultrasound measured 1 cm, and was lobulated. Biopsy was recommended, but the patient opted to proceed directly with lumpectomy, and this was performed 11/09/2013. The final pathology ([id number]) showed an invasive ductal carcinoma, grade 2, measuring 1.1 cm, with low-grade ductal carcinoma in situ. The tumor was estrogen receptor 100% positive, and progesterone receptor 94%  positive, both with strong staining intensity. The MIB-1 was 10%. There was no HER-2 amplification with a ratio by CISH of 0.90, and the number per cell being 1.80. She had radiation to right breast by Dr Valere Dross 4250 cGy from 12-15-13 thru 01-11-14, tolerated well. She began anastrozole 01-28-14.  Review of systems as above, also: Good energy and appetite. No noted changes in either breast. No swelling RUE. No other bleeding. No cardiac symptoms. Per Dr Amedeo Plenty, she no longer needs screening colonoscopies. Gabapentin 300 mg at hs for hot flashes did help sleep, but she was too drowsy next day so stopped this; "1/2 xanax" at hs helps her sleep despite the hot flashes. No arthralgias. No LE swelling.  Remainder of 10 point Review of Systems negative.  Objective:  Vital signs in last 24 hours:  BP 135/75  Pulse 97  Temp(Src) 97.6 F (36.4 C) (Oral)  Resp 17  Ht 5' 6.5" (1.689 m)  Wt 149 lb 3.2 oz (67.677 kg)  BMI 23.72 kg/m2  SpO2 98%  Alert, oriented and appropriate. Ambulatory without difficulty.  Delightful, excellent historian, looks comfortable.  HEENT:PERRL, sclerae not icteric. Oral mucosa moist without lesions, posterior pharynx clear.  Neck supple. No JVD.  Lymphatics:no cervical,suraclavicular, axillary or inguinal adenopathy Resp: clear to auscultation bilaterally and normal percussion bilaterally Cardio: regular rate and rhythm. No gallop. GI: soft, nontender, not distended, no mass or organomegaly. Normally active bowel sounds.  Musculoskeletal/ Extremities: without pitting edema, cords, tenderness. Back nontender. Neuro: no peripheral neuropathy. Otherwise nonfocal Skin without rash, ecchymosis, petechiae Breasts: Right lumpectomy scar well healed, otherwise bilaterally without  dominant mass, skin or nipple findings of concern. Axillae benign.   Lab Results:  Results for orders placed in visit on 04/08/14  CBC WITH DIFFERENTIAL      Result Value Ref Range   WBC 6.3  3.9 -  10.3 10e3/uL   NEUT# 4.6  1.5 - 6.5 10e3/uL   HGB 11.6  11.6 - 15.9 g/dL   HCT 34.5 (*) 34.8 - 46.6 %   Platelets 281  145 - 400 10e3/uL   MCV 94.9  79.5 - 101.0 fL   MCH 31.9  25.1 - 34.0 pg   MCHC 33.6  31.5 - 36.0 g/dL   RBC 3.64 (*) 3.70 - 5.45 10e6/uL   RDW 12.9  11.2 - 14.5 %   lymph# 1.0  0.9 - 3.3 10e3/uL   MONO# 0.5  0.1 - 0.9 10e3/uL   Eosinophils Absolute 0.2  0.0 - 0.5 10e3/uL   Basophils Absolute 0.0  0.0 - 0.1 10e3/uL   NEUT% 72.5  38.4 - 76.8 %   LYMPH% 16.2  14.0 - 49.7 %   MONO% 8.2  0.0 - 14.0 %   EOS% 2.7  0.0 - 7.0 %   BASO% 0.4  0.0 - 2.0 %  COMPREHENSIVE METABOLIC PANEL (RC78)      Result Value Ref Range   Sodium 141  136 - 145 mEq/L   Potassium 4.3  3.5 - 5.1 mEq/L   Chloride 106  98 - 109 mEq/L   CO2 25  22 - 29 mEq/L   Glucose 92  70 - 140 mg/dl   BUN 18.9  7.0 - 26.0 mg/dL   Creatinine 1.0  0.6 - 1.1 mg/dL   Total Bilirubin 0.37  0.20 - 1.20 mg/dL   Alkaline Phosphatase 75  40 - 150 U/L   AST 15  5 - 34 U/L   ALT 7  0 - 55 U/L   Total Protein 6.7  6.4 - 8.3 g/dL   Albumin 3.5  3.5 - 5.0 g/dL   Calcium 9.8  8.4 - 10.4 mg/dL   Anion Gap 10  3 - 11 mEq/L    Hemoglobin is down from 12.4 in 11-2013, possibly with recent epistaxis.  Studies/Results:  Bone density scan from [facility] 03-18-14 reviewed and copy given to patient. Lowest T score was -1.3 at hip, for osteopenic density  Medications: I have reviewed the patient's current medications. Discussed bone density considerations with anastrazole. Discussed use of Afrin nose spray if acute bleeding. Suggested 1/2 of gabapentin at hs, or benadryl as this also makes her drowsy and would likely help her return to sleep more quickly from hot flashes.  DISCUSSION: I have recommended that she have repeat CBC by PCP or here in 2-3 months, to follow up the lower hemoglobin. She prefers this by PCP, and I will send this note to Dr Addison Lank  Assessment/Plan: 1.T1cNx stage IA invasive ductal carcinoma of right  breast, ER PR + HER-2 negative in 80 yo lady: post lumpectomy, local radiation and on anastrozole since 01-2014. Return visit to Dr Ninfa Linden in June then to medical oncology in Nov. Bilateral mammograms due Nov at [facility] 2.osteopenia: continue good calcium, D and weight bearing exercise 3.slightly lower hemoglobin, which may be related to epistaxis. Suggest repeat in next few months 4.atrial fibrillation on xarelto 5.HTN 6.rosacea 7.post hysterectomy 1973 and subsequent BSO, for endometriosis and benign ovarian finding 8.advance directives done per EMR information  Time spent 25 minutes including >50% counseling and coordination of care  Gordy Levan, MD   04/08/2014, 11:36 AM

## 2014-05-10 ENCOUNTER — Encounter (INDEPENDENT_AMBULATORY_CARE_PROVIDER_SITE_OTHER): Payer: Self-pay | Admitting: Surgery

## 2014-05-10 ENCOUNTER — Ambulatory Visit (INDEPENDENT_AMBULATORY_CARE_PROVIDER_SITE_OTHER): Payer: Medicare Other | Admitting: Surgery

## 2014-05-10 VITALS — BP 122/80 | HR 74 | Ht 66.0 in | Wt 149.0 lb

## 2014-05-10 DIAGNOSIS — Z853 Personal history of malignant neoplasm of breast: Secondary | ICD-10-CM

## 2014-05-10 NOTE — Progress Notes (Signed)
Subjective:     Patient ID: Katherine Powell, female   DOB: [birth year], 80 y.o.   MRN: [id number]  HPI She is here for long-term followup regarding her right breast cancer. She has finished her radiation and is on anti-hormonal therapy. She does have a little discomfort in her axilla and breast but is otherwise doing well  Review of Systems     Objective:   Physical Exam  on exam, her breast incision is well-healed and there are no palpable masses in the right breast. There is no axillary adenopathy    Assessment:     Patient doing well but long-term history of right breast cancer     Plan:     She will be due for mammograms in November. She will continue to be followed at the cancer center. I will see her back in one year

## 2014-06-10 ENCOUNTER — Ambulatory Visit (INDEPENDENT_AMBULATORY_CARE_PROVIDER_SITE_OTHER): Payer: Medicare Other | Admitting: Nurse Practitioner

## 2014-06-10 ENCOUNTER — Encounter: Payer: Self-pay | Admitting: Nurse Practitioner

## 2014-06-10 VITALS — BP 140/84 | HR 60 | Ht 66.5 in | Wt 148.8 lb

## 2014-06-10 DIAGNOSIS — I4891 Unspecified atrial fibrillation: Secondary | ICD-10-CM

## 2014-06-10 DIAGNOSIS — Z79899 Other long term (current) drug therapy: Secondary | ICD-10-CM

## 2014-06-10 DIAGNOSIS — I480 Paroxysmal atrial fibrillation: Secondary | ICD-10-CM

## 2014-06-10 MED ORDER — FLECAINIDE ACETATE 100 MG PO TABS: 100.0000 mg | ORAL_TABLET | Freq: Two times a day (BID) | ORAL | Status: DC

## 2014-06-10 NOTE — Patient Instructions (Addendum)
Stay on your current medicines but we will increase the Flecainide to 100 mg two times a day  We will check for samples of Xarelto  Stay active.  See Dr. Tamala Julian in 6 months  Call the [place] office at [phone] if you have any questions, problems or concerns.

## 2014-06-10 NOTE — Progress Notes (Addendum)
Katherine Powell Date of Birth: 1932/05/21 Medical Record #[id number]  History of Present Illness: [name] is seen back today for a follow up visit. Seen for Dr. [name]. She has PAF, HTN and is on anticoagulation. She has had a prior negative ischemic evaluation. She is on Flecainide therapy. Has had breast cancer as well.   Last seen in Spetember - Flecainide was started at that time due to increasingly frequent episodes of tachycardia.   Comes back today. Here alone. She is doing pretty good. Has had multiple spells of PAF - usually short lived. Has had 2 or 3 spells in June where she was more symptomatic - more fatigued and short of breath. She has used an occasional extra 50 mg of Flecainide. She is asking about increasing her dose. No chest pain. Walks fairly regularly. Was diagnosed with breast cancer since last visit here - has had surgery and radiation. She is on Arimidex.   Current Outpatient Prescriptions  Medication Sig Dispense Refill  . acetaminophen (TYLENOL) 650 MG CR tablet Take 650 mg by mouth every 8 (eight) hours as needed for pain.      [name] ALPRAZolam (XANAX) 0.5 MG tablet Take 0.5 mg by mouth at bedtime as needed for anxiety.      . calcium carbonate (OS-CAL) 600 MG TABS tablet Take 600 mg by mouth 2 (two) times daily with a meal.      . CARTIA XT 120 MG 24 hr capsule       . cholecalciferol (VITAMIN D) 400 UNITS TABS tablet Take 2,000 Units by mouth.       . cycloSPORINE (RESTASIS) 0.05 % ophthalmic emulsion 1 drop 2 (two) times daily.      . fexofenadine (ALLEGRA) 180 MG tablet Take 180 mg by mouth daily.      . flecainide (TAMBOCOR) 50 MG tablet Take 50 mg by mouth 2 (two) times daily.       . fluticasone (FLONASE) 50 MCG/ACT nasal spray Place into both nostrils daily.      [name] glucosamine-chondroitin 500-400 MG tablet Take 1 tablet by mouth 2 (two) times daily.       [name] guaiFENesin (MUCINEX) 600 MG 12 hr tablet Take by mouth as needed.       . Melatonin 5 MG CAPS Take 5  mg by mouth. Take at bedtime.      . rivaroxaban (XARELTO) 20 MG TABS tablet Take 1 tablet (20 mg total) by mouth daily with supper.      . sodium chloride (OCEAN) 0.65 % nasal spray Place 1 spray into the nose as needed for congestion.      . trolamine salicylate (ASPERCREME) 10 % cream Apply 1 application topically as needed for muscle pain.      . vitamin E 400 UNIT capsule Take 400 Units by mouth daily.       No current facility-administered medications for this visit.    Allergies  Allergen Reactions  . Codeine     nausea    Past Medical History  Diagnosis Date  . Arrhythmia     atrial fibrillation/  on   . Hypertension   . Rosacea   . Atrial fibrillation   . Postmenopausal   . Sleep disturbance   . Thyroid disease   . Urinary incontinence   . Contact lens/glasses fitting     contact rt eye  . Seasonal allergies   . Arthritis   . Anxiety   . Breast cancer 11/09/13  right upper outer  . Hx of radiation therapy 12/15/13- 01/11/14    right breast 4250 cGy in 17 sessions, (hypo-fractionated), no boost    Past Surgical History  Procedure Laterality Date  . Cystocele repair  2008  . Rectocele repair  2008  . Tonsillectomy    . Cataract extraction  2013    both  . Appendectomy  1954  . Eye surgery      cataracts-plugs for eyes  . Back surgery      lumb-2005  . Breast lumpectomy with needle localization Right 11/09/2013    Procedure: BREAST LUMPECTOMY WITH NEEDLE LOCALIZATION;  Surgeon: Katherine Bowie, MD;  Location: [facility];  Service: General;  Laterality: Right;  . Abdominal hysterectomy  1973    endometriosis  . Bilateral oophorectomy      benign tumor on ovary    History  Smoking status  . Never Smoker   Smokeless tobacco  . Never Used    History  Alcohol Use No    Family History  Problem Relation Age of Onset  . Alzheimer's disease Mother     Review of Systems: The review of systems is per the HPI.  All other systems were  reviewed and are negative.  Physical Exam: BP 140/84  Pulse 60  Ht 5' 6.5" (1.689 m)  Wt 148 lb 12.8 oz (67.495 kg)  BMI 23.66 kg/m2  SpO2 99% Patient is very pleasant and in no acute distress. Skin is warm and dry. Color is normal.  HEENT is unremarkable. Normocephalic/atraumatic. PERRL. Sclera are nonicteric. Neck is supple. No masses. No JVD. Lungs are clear. Cardiac exam shows a regular rate and rhythm. Abdomen is soft. Extremities are without edema. Gait and ROM are intact. No gross neurologic deficits noted.  Wt Readings from Last 3 Encounters:  06/10/14 148 lb 12.8 oz (67.495 kg)  05/10/14 149 lb (67.586 kg)  04/08/14 149 lb 3.2 oz (67.677 kg)    LABORATORY DATA/PROCEDURES: EKG today shows sinus brady - reviewed with Dr. [name].    Lab Results  Component Value Date   WBC 6.3 04/08/2014   HGB 11.6 04/08/2014   HCT 34.5* 04/08/2014   PLT 281 04/08/2014   GLUCOSE 92 04/08/2014   ALT 7 04/08/2014   AST 15 04/08/2014   NA 141 04/08/2014   K 4.3 04/08/2014   CL 104 11/09/2013   CREATININE 1.0 04/08/2014   BUN 18.9 04/08/2014   CO2 25 04/08/2014   INR 1.05 11/09/2013    BNP (last 3 results) No results found for this basename: PROBNP,  in the last 8760 hours   Assessment / Plan:  1. PAF - on Flecainide - lots of breakthrough - will increase her to 100 mg BID. She has already had negative GXT to rule out proarrhythmia. Discussed with Dr. [name] here in the office today - he is in agreement.   2. HTN  3. Chronic anticoagulation with Xarelto - no problems noted.  See back in 6 months unless she continues to have issues. Will try to give samples of Xarelto today.   Patient is agreeable to this plan and will call if any problems develop in the interim.   Burtis Junes, RN, [place] [address] [place], [place]  [phone]

## 2014-07-01 ENCOUNTER — Other Ambulatory Visit: Payer: Self-pay

## 2014-07-01 MED ORDER — FLECAINIDE ACETATE 100 MG PO TABS: 100.0000 mg | ORAL_TABLET | Freq: Two times a day (BID) | ORAL | Status: DC

## 2014-09-10 ENCOUNTER — Telehealth: Payer: Self-pay | Admitting: *Deleted

## 2014-09-10 NOTE — Telephone Encounter (Signed)
Xarelto samples placed at the front desk for patient. 

## 2014-09-23 ENCOUNTER — Telehealth: Payer: Self-pay | Admitting: Interventional Cardiology

## 2014-09-23 NOTE — Telephone Encounter (Signed)
returned pt call. lmtcb 

## 2014-09-23 NOTE — Telephone Encounter (Signed)
New Message   Pt called reports she is having problems with the medication prescribed she cannot function it makes her sleepy//Pt requests a call back to discuss an alternative for Flecainide 100 mg

## 2014-09-24 NOTE — Telephone Encounter (Signed)
Follow up ° ° ° ° ° °Returning Lisa's call °

## 2014-09-24 NOTE — Telephone Encounter (Signed)
returned pt call. lmtcb 

## 2014-09-24 NOTE — Telephone Encounter (Signed)
Returned pt call pt sts that since her o/v in 06/2014 Cecille Rubin G,NP increased her Flecainide to 100mg  bid at that time pt was having breakthrough afib episodes on the lower dose of 50mg  bid. Pt sts that since the increase she has been very fatigued pt sts that at first she thought it was her body adjusting to the medication increase.pt sts that it is 3 months later and the fatigue has gotten worse, she also finds it harder to concentrate at times.pt wants to know if there is an alternative medication, or if she should try taking flecainide at different times. Pt currently take it at 10am and 10pm. Adv pt that I will talk with Dr.Smith and call back with his recommendations.Pt verbalized understranding.

## 2014-09-24 NOTE — Telephone Encounter (Signed)
Message routed to Dr.Smith for adv 

## 2014-10-01 MED ORDER — FLECAINIDE ACETATE 100 MG PO TABS: ORAL_TABLET | ORAL | Status: DC

## 2014-10-01 NOTE — Telephone Encounter (Signed)
Pt aware of Dr.Smith recommendations.Change flecainide to 50 mg AM and 100 mg PM. Pt agreeable and verbalized understanding.

## 2014-10-01 NOTE — Telephone Encounter (Signed)
Change flecainide to 50 mg AM and 100 mg PM

## 2014-10-04 ENCOUNTER — Other Ambulatory Visit: Payer: Self-pay | Admitting: Oncology

## 2014-10-05 ENCOUNTER — Other Ambulatory Visit: Payer: Self-pay | Admitting: Oncology

## 2014-10-05 DIAGNOSIS — C50411 Malignant neoplasm of upper-outer quadrant of right female breast: Secondary | ICD-10-CM

## 2014-10-06 ENCOUNTER — Ambulatory Visit: Payer: Medicare Other | Admitting: Oncology

## 2014-10-06 ENCOUNTER — Other Ambulatory Visit: Payer: Medicare Other

## 2014-10-13 ENCOUNTER — Ambulatory Visit (HOSPITAL_BASED_OUTPATIENT_CLINIC_OR_DEPARTMENT_OTHER): Payer: Medicare Other | Admitting: Oncology

## 2014-10-13 ENCOUNTER — Telehealth: Payer: Self-pay | Admitting: Oncology

## 2014-10-13 ENCOUNTER — Other Ambulatory Visit (HOSPITAL_BASED_OUTPATIENT_CLINIC_OR_DEPARTMENT_OTHER): Payer: Medicare Other

## 2014-10-13 VITALS — BP 148/71 | HR 58 | Temp 97.6°F | Resp 18 | Ht 66.5 in | Wt 152.5 lb

## 2014-10-13 DIAGNOSIS — C50411 Malignant neoplasm of upper-outer quadrant of right female breast: Secondary | ICD-10-CM

## 2014-10-13 DIAGNOSIS — Z853 Personal history of malignant neoplasm of breast: Secondary | ICD-10-CM

## 2014-10-13 DIAGNOSIS — Z79811 Long term (current) use of aromatase inhibitors: Secondary | ICD-10-CM

## 2014-10-13 LAB — COMPREHENSIVE METABOLIC PANEL (CC13)
ALT: 14 U/L (ref 0–55)
AST: 18 U/L (ref 5–34)
Albumin: 4 g/dL (ref 3.5–5.0)
Alkaline Phosphatase: 71 U/L (ref 40–150)
Anion Gap: 5 mEq/L (ref 3–11)
BUN: 19.2 mg/dL (ref 7.0–26.0)
CO2: 28 mEq/L (ref 22–29)
Calcium: 9.4 mg/dL (ref 8.4–10.4)
Chloride: 105 mEq/L (ref 98–109)
Creatinine: 1 mg/dL (ref 0.6–1.1)
Glucose: 91 mg/dl (ref 70–140)
Potassium: 4.1 mEq/L (ref 3.5–5.1)
Sodium: 138 mEq/L (ref 136–145)
Total Bilirubin: 0.47 mg/dL (ref 0.20–1.20)
Total Protein: 6.6 g/dL (ref 6.4–8.3)

## 2014-10-13 LAB — CBC WITH DIFFERENTIAL/PLATELET
BASO%: 0.2 % (ref 0.0–2.0)
Basophils Absolute: 0 10*3/uL (ref 0.0–0.1)
EOS%: 2.3 % (ref 0.0–7.0)
Eosinophils Absolute: 0.1 10*3/uL (ref 0.0–0.5)
HCT: 35 % (ref 34.8–46.6)
HGB: 11.9 g/dL (ref 11.6–15.9)
LYMPH%: 24.6 % (ref 14.0–49.7)
MCH: 32.1 pg (ref 25.1–34.0)
MCHC: 34 g/dL (ref 31.5–36.0)
MCV: 94.3 fL (ref 79.5–101.0)
MONO#: 0.4 10*3/uL (ref 0.1–0.9)
MONO%: 9.1 % (ref 0.0–14.0)
NEUT#: 2.8 10*3/uL (ref 1.5–6.5)
NEUT%: 63.8 % (ref 38.4–76.8)
Platelets: 184 10*3/uL (ref 145–400)
RBC: 3.71 10*6/uL (ref 3.70–5.45)
RDW: 13.8 % (ref 11.2–14.5)
WBC: 4.4 10*3/uL (ref 3.9–10.3)
lymph#: 1.1 10*3/uL (ref 0.9–3.3)

## 2014-10-13 MED ORDER — ANASTROZOLE 1 MG PO TABS: 1.0000 mg | ORAL_TABLET | Freq: Every day | ORAL | Status: DC

## 2014-10-13 NOTE — Progress Notes (Signed)
[name]  [phone] Fax:([phone]) [phone]     ID: Katherine Powell OB: [birth year]  MR#: [id number]  KGY#:[id number]  PCP: Katherine Caraway, MD GYN:   SUCoralie Powell OTHER MD: Katherine Powell, Katherine Powell, Katherine Powell, Katherine Powell  CHIEF COMPLAINT: Estrogen receptor positive breast cancer  Current treatment: Anastrozole  HISTORY OF PRESENT ILLNESS: From the original intake note:  Katherine Powell had routine screening mammography at [facility] 10/14/2013 showing a 1 cm irregular mass in the right breast. Right digital mammography and ultrasonography of the right breast 10/21/2013 showed a mass with indistinct margins at the 12:00 position which by ultrasound measured 1 cm, and was lobulated. Biopsy was recommended, but the patient opted to proceed directly with lumpectomy, and this was performed 11/09/2013. The final pathology ([id number]) showed an invasive ductal carcinoma, grade 2, measuring 1.1 cm, with low-grade ductal carcinoma in situ. The tumor was estrogen receptor 100% positive, and progesterone receptor 94% positive, both with strong staining intensity. The MIB-1 was 10%. There was no HER-2 amplification with a ratio by CISH of 0.90, and the number per cell being 1.80.  The patient's subsequent history is as detailed below   INTERVAL HISTORY: Katherine Powell today for follow-up of her breast cancer. She has been on anastrozole since February 2015. She is tolerating it well, with some hot flashes which rarely wake her from sleep. She did try gabapentin but it "knocked her out". She has vaginal dryness but "that's not a concern". She is not interested in our "intimacy and pelvic health was "program at this point. -- her biggest concerns is her husband's new diagnosis of prostate cancer. He is being treated by Katherine Powell   REVIEW OF SYSTEMS: She does weights 3 days a week and yoga 2 days a week. She has a little bit of macular degeneration in the right eye and  she is being followed for that by Katherine Powell. Aside from arthritis problems, and her history of A. Fib, a detailed review of systems today was noncontributory  PAST MEDICAL HISTORY: Past Medical History  Diagnosis Date  . Arrhythmia     atrial fibrillation/  on   . Hypertension   . Rosacea   . Atrial fibrillation   . Postmenopausal   . Sleep disturbance   . Thyroid disease   . Urinary incontinence   . Contact lens/glasses fitting     contact rt eye  . Seasonal allergies   . Arthritis   . Anxiety   . Breast cancer 11/09/13    right upper outer  . Hx of radiation therapy 12/15/13- 01/11/14    right breast 4250 cGy in 17 sessions, (hypo-fractionated), no boost    PAST SURGICAL HISTORY: Past Surgical History  Procedure Laterality Date  . Cystocele repair  2008  . Rectocele repair  2008  . Tonsillectomy    . Cataract extraction  2013    both  . Appendectomy  1954  . Eye surgery      cataracts-plugs for eyes  . Back surgery      lumb-2005  . Breast lumpectomy with needle localization Right 11/09/2013    Procedure: BREAST LUMPECTOMY WITH NEEDLE LOCALIZATION;  Surgeon: Katherine Bowie, MD;  Location: [facility];  Service: General;  Laterality: Right;  . Abdominal hysterectomy  1973    endometriosis  . Bilateral oophorectomy      benign tumor on ovary    FAMILY HISTORY Family History  Problem Relation Age of Onset  .  Alzheimer's disease Mother    the patient's father died from COPD at age 64 in the setting of tobacco abuse. The patient's mother died at the age of 11 with Alzheimer's disease. The patient had no brothers, one sister. There is no history of breast or ovarian cancer in the family to her knowledge  GYNECOLOGIC HISTORY:  Menarche age 62, first live birth age 84. The patient is GX P3. She underwent simple hysterectomy in 1973 and took hormone replacement (initially Prempro later estrogens only) until December 2014. 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 Her husband Katherine Powell was an Chief Financial Officer for [employer]. Their son Katherine Powell lives in [place]. where he works in Engineer, technical sales. Daughter and Katherine Powell lives in [place] where she works as a Designer, jewellery in an independent clinic. Daughter Katherine Powell listened Katherine Powell. She works as a Tree surgeon. The patient has 7 grandchildren. She is a Tourist information centre manager.    ADVANCED DIRECTIVES: In place   HEALTH MAINTENANCE: History  Substance Use Topics  . Smoking status: Never Smoker   . Smokeless tobacco: Never Used  . Alcohol Use: No     Colonoscopy:  PAP:  Bone density: At [facility] 09/27/2010, normal  Lipid panel:  Allergies  Allergen Reactions  . Codeine     nausea    Current Outpatient Prescriptions  Medication Sig Dispense Refill  . acetaminophen (TYLENOL) 650 MG CR tablet Take 650 mg by mouth every 8 (eight) hours as needed for pain.    [name] ALPRAZolam (XANAX) 0.5 MG tablet Take 0.5 mg by mouth at bedtime as needed for anxiety.    [name] anastrozole (ARIMIDEX) 1 MG tablet Take 1 mg by mouth daily.    . calcium carbonate (OS-CAL) 600 MG TABS tablet Take 600 mg by mouth 2 (two) times daily with a meal.    . CARTIA XT 120 MG 24 hr capsule     . cholecalciferol (VITAMIN D) 400 UNITS TABS tablet Take 2,000 Units by mouth.     . cycloSPORINE (RESTASIS) 0.05 % ophthalmic emulsion 1 drop 2 (two) times daily.    . fexofenadine (ALLEGRA) 180 MG tablet Take 180 mg by mouth daily.    . flecainide (TAMBOCOR) 100 MG tablet Take 40m in the AM and 104min the PM    . fluticasone (FLONASE) 50 MCG/ACT nasal spray Place into both nostrils daily.    . Marland Kitchenlucosamine-chondroitin 500-400 MG tablet Take 1 tablet by mouth 2 (two) times daily.     . Marland KitchenuaiFENesin (MUCINEX) 600 MG 12 hr tablet Take by mouth as needed.     . Melatonin  5 MG CAPS Take 5 mg by mouth. Take at bedtime.    . rivaroxaban (XARELTO) 20 MG TABS tablet Take 1 tablet (20 mg total) by mouth daily with supper.    . sodium chloride (OCEAN) 0.65 % nasal spray Place 1 spray into the nose as needed for congestion.    . trolamine salicylate (ASPERCREME) 10 % cream Apply 1 application topically as needed for muscle pain.    . vitamin E 400 UNIT capsule Take 400 Units by mouth daily.     No current facility-administered medications for this visit.    OBJECTIVE: Older white woman who appears well  Filed Vitals:   10/13/14 1033  BP: 148/71  Pulse: 58  Temp:  97.6 F (36.4 C)  Resp: 18     Body mass index is 24.25 kg/(m^2).    ECOG FS:1 - Symptomatic but completely ambulatory  Sclerae unicteric, EOMs intact Oropharynx clear and moist No cervical or supraclavicular adenopathy Lungs no rales or rhonchi Heart regular rate and rhythm Abd soft, nontender, positive bowel sounds MSK no focal spinal tenderness, no upper extremity lymphedema Neuro: nonfocal, well oriented, appropriate affect Breasts: right breast is status post lumpectomy and radiation. There is no evidence of local recurrence. The right axilla is benign per the left breast is unremarkable.  LAB RESULTS:  CMP     Component Value Date/Time   NA 138 10/13/2014 1017   NA 139 11/09/2013 1200   K 4.1 10/13/2014 1017   K 3.5 11/09/2013 1200   CL 104 11/09/2013 1200   CO2 28 10/13/2014 1017   CO2 28 11/09/2013 1200   GLUCOSE 91 10/13/2014 1017   GLUCOSE 86 11/09/2013 1200   BUN 19.2 10/13/2014 1017   BUN 14 11/09/2013 1200   CREATININE 1.0 10/13/2014 1017   CREATININE 0.89 11/09/2013 1200   CALCIUM 9.4 10/13/2014 1017   CALCIUM 8.9 11/09/2013 1200   PROT 6.6 10/13/2014 1017   ALBUMIN 4.0 10/13/2014 1017   AST 18 10/13/2014 1017   ALT 14 10/13/2014 1017   ALKPHOS 71 10/13/2014 1017   BILITOT 0.47 10/13/2014 1017   GFRNONAA 60* 11/09/2013 1200   GFRAA 70* 11/09/2013 1200    I No  results found for: SPEP  Lab Results  Component Value Date   WBC 4.4 10/13/2014   NEUTROABS 2.8 10/13/2014   HGB 11.9 10/13/2014   HCT 35.0 10/13/2014   MCV 94.3 10/13/2014   PLT 184 10/13/2014      Chemistry      Component Value Date/Time   NA 138 10/13/2014 1017   NA 139 11/09/2013 1200   K 4.1 10/13/2014 1017   K 3.5 11/09/2013 1200   CL 104 11/09/2013 1200   CO2 28 10/13/2014 1017   CO2 28 11/09/2013 1200   BUN 19.2 10/13/2014 1017   BUN 14 11/09/2013 1200   CREATININE 1.0 10/13/2014 1017   CREATININE 0.89 11/09/2013 1200      Component Value Date/Time   CALCIUM 9.4 10/13/2014 1017   CALCIUM 8.9 11/09/2013 1200   ALKPHOS 71 10/13/2014 1017   AST 18 10/13/2014 1017   ALT 14 10/13/2014 1017   BILITOT 0.47 10/13/2014 1017       No results found for: LABCA2  No components found for: LABCA125  No results for input(s): INR in the last 168 hours.  Urinalysis No results found for: COLORURINE  STUDIES: Mammography due December 2015  ASSESSMENT: 80 y.o. Katherine Powell woman  (1) Status post right lumpectomy 11/09/2013 for a pT1c NX, stage IA invasive ductal carcinoma, grade 2, estrogen receptor 100% positive, progesterone receptor 94% positive, with an MIB-1 of 10%, and no HER-2 amplification.  (2) adjuvant radiation completed February 2015.  (3) anastrozole started February 2015  (a) bone density at [facility] for 16 2015 shows mild osteopenia with a T score of -1.3  (4) the patient is status post hysterectomy and bilateral salpingo-oophorectomy  (5) atrial fibrillation, on Rivaroxaban  PLAN: Lynann is doing remarkably well and there is no evidence of disease recurrence or progression. She has mild osteopenia. She is an excellent exercise program and continuing vitamin D supplementation. We discussed the fact that calcium supplementation is optional. For now she will continue on that as well.  She will see Dr. Ninfa Linden again in June. Accordingly she will see [place]  again in October of next year. The overall plan is to continue anastrozole for total of 5 years.  Gavriela has a good understanding of the overall plan, and agrees with it. She knows the goal of treatment is cure. She will call with any problems that may develop before next visit here.     Chauncey Cruel, MD   10/13/2014 11:03 AM

## 2014-10-13 NOTE — Telephone Encounter (Signed)
per pof to sch pt appt-gave pt copy of sch °

## 2014-10-14 NOTE — Addendum Note (Signed)
Addended by: Laureen Abrahams on: 10/14/2014 05:41 PM   Modules accepted: Medications

## 2014-11-23 ENCOUNTER — Encounter: Payer: Self-pay | Admitting: Family Medicine

## 2014-12-29 ENCOUNTER — Ambulatory Visit (INDEPENDENT_AMBULATORY_CARE_PROVIDER_SITE_OTHER): Payer: PPO | Admitting: Interventional Cardiology

## 2014-12-29 ENCOUNTER — Encounter: Payer: Self-pay | Admitting: Interventional Cardiology

## 2014-12-29 VITALS — BP 140/80 | HR 54 | Ht 66.5 in | Wt 150.0 lb

## 2014-12-29 DIAGNOSIS — Z5181 Encounter for therapeutic drug level monitoring: Secondary | ICD-10-CM

## 2014-12-29 DIAGNOSIS — Z79899 Other long term (current) drug therapy: Secondary | ICD-10-CM

## 2014-12-29 DIAGNOSIS — Z7901 Long term (current) use of anticoagulants: Secondary | ICD-10-CM

## 2014-12-29 DIAGNOSIS — I480 Paroxysmal atrial fibrillation: Secondary | ICD-10-CM | POA: Insufficient documentation

## 2014-12-29 NOTE — Patient Instructions (Signed)
Your physician recommends that you continue on your current medications as directed. Please refer to the Current Medication list given to you today.  Your physician wants you to follow-up in: 1 year with Dr.Smith You will receive a reminder letter in the mail two months in advance. If you don't receive a letter, please call our office to schedule the follow-up appointment.  

## 2014-12-29 NOTE — Progress Notes (Signed)
Patient ID: Katherine Powell, female   DOB: 07/12/1932, 81 y.o.   MRN: [id number]    Cardiology Office Note   Date:  12/29/2014   ID:  Katherine Powell, DOB 11-Jan-1932, MRN [id number]  PCP:  Cari Caraway, MD  Cardiologist:    Sinclair Grooms, MD   Chief Complaint  Patient presents with  . Breast Cancer    6 month f/u      History of Present Illness: Katherine Powell is a 81 y.o. female who presents for follow-up of paroxysmal atrial fibrillation. She is on chronic for an iron therapy without significant side effects or complications. She is on chronic anticoagulation therapy, Xarelto, without complications. She has not had syncope, dyspnea, or chest pain.    Past Medical History  Diagnosis Date  . Arrhythmia     atrial fibrillation/  on   . Hypertension   . Rosacea   . Atrial fibrillation   . Postmenopausal   . Sleep disturbance   . Thyroid disease   . Urinary incontinence   . Contact lens/glasses fitting     contact rt eye  . Seasonal allergies   . Arthritis   . Anxiety   . Breast cancer 11/09/13    right upper outer  . Hx of radiation therapy 12/15/13- 01/11/14    right breast 4250 cGy in 17 sessions, (hypo-fractionated), no boost    Past Surgical History  Procedure Laterality Date  . Cystocele repair  2008  . Rectocele repair  2008  . Tonsillectomy    . Cataract extraction  2013    both  . Appendectomy  1954  . Eye surgery      cataracts-plugs for eyes  . Back surgery      lumb-2005  . Breast lumpectomy with needle localization Right 11/09/2013    Procedure: BREAST LUMPECTOMY WITH NEEDLE LOCALIZATION;  Surgeon: Harl Bowie, MD;  Location: [facility];  Service: General;  Laterality: Right;  . Abdominal hysterectomy  1973    endometriosis  . Bilateral oophorectomy      benign tumor on ovary     Current Outpatient Prescriptions  Medication Sig Dispense Refill  . acetaminophen (TYLENOL) 650 MG CR tablet Take 650 mg by mouth every 8  (eight) hours as needed for pain.    [name] ALPRAZolam (XANAX) 0.5 MG tablet Take 0.5 mg by mouth at bedtime as needed for anxiety.    [name] anastrozole (ARIMIDEX) 1 MG tablet Take 1 tablet (1 mg total) by mouth daily. 90 tablet 4  . calcium carbonate (OS-CAL) 600 MG TABS tablet Take 600 mg by mouth 2 (two) times daily with a meal.    . CARTIA XT 120 MG 24 hr capsule Take 120 mg by mouth daily.     . cholecalciferol (VITAMIN D) 400 UNITS TABS tablet Take 2,000 Units by mouth.     . cycloSPORINE (RESTASIS) 0.05 % ophthalmic emulsion 1 drop 2 (two) times daily.    . fexofenadine (ALLEGRA) 180 MG tablet Take 180 mg by mouth daily. 1/2 tab in am   1 tab in pm    . flecainide (TAMBOCOR) 100 MG tablet Take 50mg  in the AM and 100mg  in the PM    . fluticasone (FLONASE) 50 MCG/ACT nasal spray Place into both nostrils daily.     [name] glucosamine-chondroitin 500-400 MG tablet Take 1 tablet by mouth 2 (two) times daily.     [name] guaiFENesin (MUCINEX) 600 MG 12 hr tablet Take by mouth as  needed for cough or to loosen phlegm.     . rivaroxaban (XARELTO) 20 MG TABS tablet Take 1 tablet (20 mg total) by mouth daily with supper.    . sodium chloride (OCEAN) 0.65 % nasal spray Place 1 spray into the nose as needed for congestion.    . trolamine salicylate (ASPERCREME) 10 % cream Apply 1 application topically as needed for muscle pain.    . vitamin E 400 UNIT capsule Take 400 Units by mouth daily.     No current facility-administered medications for this visit.    Allergies:   Codeine    Social History:  The patient  reports that she has never smoked. She has never used smokeless tobacco. She reports that she does not drink alcohol or use illicit drugs.   Family History:  The patient's family history includes Alzheimer's disease in her mother; Stroke in her father. There is no history of Heart attack.    ROS:  Please see the history of present illness.   Otherwise, review of systems are positive for none.   All other  systems are reviewed and negative.    PHYSICAL EXAM: VS:  BP 140/80 mmHg  Pulse 54  Ht 5' 6.5" (1.689 m)  Wt 150 lb (68.04 kg)  BMI 23.85 kg/m2  SpO2 94% , BMI Body mass index is 23.85 kg/(m^2). GEN: Well nourished, well developed, in no acute distress HEENT: normal Neck: no JVD, carotid bruits, or masses Cardiac: RRR; no murmurs, rubs, or gallops,no edema  Respiratory:  clear to auscultation bilaterally, normal work of breathing GI: soft, nontender, nondistended, + BS MS: no deformity or atrophy Skin: warm and dry, no rash Neuro:  Strength and sensation are intact Psych: euthymic mood, full affect   EKG:  EKG is not ordered today. The ekg ordered today demonstrates    Recent Labs: 10/13/2014: ALT 14; BUN 19.2; Creatinine 1.0; Hemoglobin 11.9; Platelets 184; Potassium 4.1; Sodium 138    Lipid Panel No results found for: CHOL, TRIG, HDL, CHOLHDL, VLDL, LDLCALC, LDLDIRECT    Wt Readings from Last 3 Encounters:  12/29/14 150 lb (68.04 kg)  10/13/14 152 lb 8 oz (69.174 kg)  06/10/14 148 lb 12.8 oz (67.495 kg)      Other studies Reviewed: Additional studies/ records that were reviewed today include: None. Review of the above records demonstrates: None   ASSESSMENT AND PLAN:  1.  Paroxysmal atrial fibrillation with rhythm control on flecainide 2. Flecainide with minimal if any side effects 3. Chronic anticoagulation with a relative total, without complications    Current medicines are reviewed at length with the patient today.  The patient does not have concerns regarding medicines.  The following changes have been made:  no change  Labs/ tests ordered today include:  No orders of the defined types were placed in this encounter.     Disposition:   FU with Daneen Schick 1 year   Signed, Sinclair Grooms, MD  12/29/2014 3:46 PM    [address], [place], [place]  [address] Phone: [phone]; Fax: [phone]

## 2014-12-31 ENCOUNTER — Ambulatory Visit: Payer: Medicare Other | Admitting: Interventional Cardiology

## 2015-01-05 DIAGNOSIS — M5416 Radiculopathy, lumbar region: Secondary | ICD-10-CM | POA: Insufficient documentation

## 2015-01-12 ENCOUNTER — Other Ambulatory Visit: Payer: Self-pay | Admitting: Neurosurgery

## 2015-01-12 DIAGNOSIS — M5416 Radiculopathy, lumbar region: Secondary | ICD-10-CM

## 2015-01-26 ENCOUNTER — Ambulatory Visit
Admission: RE | Admit: 2015-01-26 | Discharge: 2015-01-26 | Disposition: A | Discharge status: Home / Self Care | Payer: PPO | Source: Ambulatory Visit | Attending: Neurosurgery | Admitting: Neurosurgery

## 2015-01-26 DIAGNOSIS — M5416 Radiculopathy, lumbar region: Secondary | ICD-10-CM

## 2015-01-26 MED ORDER — GADOBENATE DIMEGLUMINE 529 MG/ML IV SOLN
13.0000 mL | Freq: Once | INTRAVENOUS | Status: AC | PRN
  Administered 2015-01-26: 13 mL via INTRAVENOUS

## 2015-02-14 ENCOUNTER — Telehealth: Payer: Self-pay

## 2015-02-14 NOTE — Telephone Encounter (Signed)
pt aware there are no Xarelto samples. she can call back later in the wk and I will recheck

## 2015-02-15 NOTE — Telephone Encounter (Signed)
Pt husband aware samples of xarelto will be left at the front desk for pick up

## 2015-03-29 ENCOUNTER — Telehealth: Payer: Self-pay

## 2015-03-29 NOTE — Telephone Encounter (Signed)
Patient called for samples of xarelto placed samples up front 

## 2015-06-22 ENCOUNTER — Telehealth: Payer: Self-pay

## 2015-06-22 NOTE — Telephone Encounter (Signed)
Patient called in requesting Xaralto 20 mg samples.

## 2015-08-26 ENCOUNTER — Telehealth: Payer: Self-pay | Admitting: *Deleted

## 2015-08-26 MED ORDER — FLECAINIDE ACETATE 100 MG PO TABS: 100.0000 mg | ORAL_TABLET | Freq: Two times a day (BID) | ORAL | Status: DC

## 2015-08-26 NOTE — Telephone Encounter (Signed)
Patient called for a refill on flecainide but she stated that she is taking 100mg  bid. Please advise. Thanks, MI

## 2015-08-26 NOTE — Telephone Encounter (Signed)
Called pt to verify flecainide dosage. Pt sts that she is taking Flecainide 100mg  as instructed by Joan Mayans earlier this year. Pt sts that she was having breakthrough Afib on the lower dose. Pt sts that she is doing well. Rx sent to pt pharmacy [id number] R-1 as requested by pt

## 2015-09-19 ENCOUNTER — Telehealth: Payer: Self-pay | Admitting: Oncology

## 2015-09-19 NOTE — Telephone Encounter (Signed)
Returned patient call re r/s 10/19 appointments. Spoke with patient and due to next availability being too far off patient will keep scheduled appointments for 10/19. Lab adjusted to be closer to f/u and patient had new time for 10/19 lab/GM @ 12:30 pm.

## 2015-09-21 ENCOUNTER — Ambulatory Visit (HOSPITAL_BASED_OUTPATIENT_CLINIC_OR_DEPARTMENT_OTHER): Payer: PPO | Admitting: Oncology

## 2015-09-21 ENCOUNTER — Other Ambulatory Visit (HOSPITAL_BASED_OUTPATIENT_CLINIC_OR_DEPARTMENT_OTHER): Payer: PPO

## 2015-09-21 VITALS — BP 137/74 | HR 61 | Temp 98.0°F | Resp 18 | Ht 66.5 in | Wt 152.8 lb

## 2015-09-21 DIAGNOSIS — C50811 Malignant neoplasm of overlapping sites of right female breast: Secondary | ICD-10-CM

## 2015-09-21 DIAGNOSIS — C50411 Malignant neoplasm of upper-outer quadrant of right female breast: Secondary | ICD-10-CM

## 2015-09-21 DIAGNOSIS — Z170 Estrogen receptor positive status [ER+]: Secondary | ICD-10-CM

## 2015-09-21 DIAGNOSIS — I4891 Unspecified atrial fibrillation: Secondary | ICD-10-CM | POA: Diagnosis not present

## 2015-09-21 DIAGNOSIS — M8580 Other specified disorders of bone density and structure, unspecified site: Secondary | ICD-10-CM | POA: Diagnosis not present

## 2015-09-21 DIAGNOSIS — N951 Menopausal and female climacteric states: Secondary | ICD-10-CM

## 2015-09-21 LAB — CBC WITH DIFFERENTIAL/PLATELET
BASO%: 0.7 % (ref 0.0–2.0)
Basophils Absolute: 0 10*3/uL (ref 0.0–0.1)
EOS%: 1.6 % (ref 0.0–7.0)
Eosinophils Absolute: 0.1 10*3/uL (ref 0.0–0.5)
HCT: 33.9 % — ABNORMAL LOW (ref 34.8–46.6)
HGB: 11.5 g/dL — ABNORMAL LOW (ref 11.6–15.9)
LYMPH%: 26.2 % (ref 14.0–49.7)
MCH: 32 pg (ref 25.1–34.0)
MCHC: 34 g/dL (ref 31.5–36.0)
MCV: 94.1 fL (ref 79.5–101.0)
MONO#: 0.4 10*3/uL (ref 0.1–0.9)
MONO%: 7.7 % (ref 0.0–14.0)
NEUT#: 3.1 10*3/uL (ref 1.5–6.5)
NEUT%: 63.8 % (ref 38.4–76.8)
Platelets: 206 10*3/uL (ref 145–400)
RBC: 3.61 10*6/uL — ABNORMAL LOW (ref 3.70–5.45)
RDW: 13.8 % (ref 11.2–14.5)
WBC: 4.8 10*3/uL (ref 3.9–10.3)
lymph#: 1.3 10*3/uL (ref 0.9–3.3)

## 2015-09-21 LAB — COMPREHENSIVE METABOLIC PANEL (CC13)
ALT: 12 U/L (ref 0–55)
AST: 18 U/L (ref 5–34)
Albumin: 3.8 g/dL (ref 3.5–5.0)
Alkaline Phosphatase: 86 U/L (ref 40–150)
Anion Gap: 6 mEq/L (ref 3–11)
BUN: 14.7 mg/dL (ref 7.0–26.0)
CO2: 25 mEq/L (ref 22–29)
Calcium: 9.3 mg/dL (ref 8.4–10.4)
Chloride: 107 mEq/L (ref 98–109)
Creatinine: 0.9 mg/dL (ref 0.6–1.1)
EGFR: 59 mL/min/{1.73_m2} — ABNORMAL LOW (ref >90)
Glucose: 96 mg/dl (ref 70–140)
Potassium: 4.1 mEq/L (ref 3.5–5.1)
Sodium: 138 mEq/L (ref 136–145)
Total Bilirubin: 0.37 mg/dL (ref 0.20–1.20)
Total Protein: 6.2 g/dL — ABNORMAL LOW (ref 6.4–8.3)

## 2015-09-21 MED ORDER — ANASTROZOLE 1 MG PO TABS: 1.0000 mg | ORAL_TABLET | Freq: Every day | ORAL | Status: DC

## 2015-09-21 MED ORDER — VENLAFAXINE HCL ER 37.5 MG PO CP24: 37.5000 mg | ORAL_CAPSULE | Freq: Every day | ORAL | Status: DC

## 2015-09-21 NOTE — Progress Notes (Signed)
[place]  Telephone:([phone] Fax:(336) [phone]     ID: [name] OB: 07/06/1932  MR#: [id number]  BPZ#:[id number]  PCP: Cari Caraway, MD GYN:   SUCoralie Keens OTHER MD: Arloa Koh, Christene Slates, Rob Hickman, Daneen Schick  CHIEF COMPLAINT: Estrogen receptor positive breast cancer  Current treatment: Anastrozole  HISTORY OF PRESENT ILLNESS: From the original intake note:  Judyann had routine screening mammography at [facility] 10/14/2013 showing a 1 cm irregular mass in the right breast. Right digital mammography and ultrasonography of the right breast 10/21/2013 showed a mass with indistinct margins at the 12:00 position which by ultrasound measured 1 cm, and was lobulated. Biopsy was recommended, but the patient opted to proceed directly with lumpectomy, and this was performed 11/09/2013. The final pathology ([id number]) showed an invasive ductal carcinoma, grade 2, measuring 1.1 cm, with low-grade ductal carcinoma in situ. The tumor was estrogen receptor 100% positive, and progesterone receptor 94% positive, both with strong staining intensity. The MIB-1 was 10%. There was no HER-2 amplification with a ratio by CISH of 0.90, and the number per cell being 1.80.  The patient's subsequent history is as detailed below   INTERVAL HISTORY: [place] today for follow-up of her breast cancer. She continues on anastrozole, which she is tolerating well except for hot flashes. She obtains it at a good price.  Since her last visit here her husband died from metastatic prostate cancer (04/17/2015.) She is hoping to stay in her home. She is actually rending it out from the market at present. She has named her daughter as her healthcare power of attorney  REVIEW OF SYSTEMS: She continues to exercise regularly, doing yoga about 4 days a week[facility] A detailed review of systems today was otherwise negative except as notedy  PAST  MEDICAL HISTORY: Past Medical History  Diagnosis Date  . Arrhythmia     atrial fibrillation/  on   . Hypertension   . Rosacea   . Atrial fibrillation   . Postmenopausal   . Sleep disturbance   . Thyroid disease   . Urinary incontinence   . Contact lens/glasses fitting     contact rt eye  . Seasonal allergies   . Arthritis   . Anxiety   . Breast cancer 11/09/13    right upper outer  . Hx of radiation therapy 12/15/13- 01/11/14    right breast 4250 cGy in 17 sessions, (hypo-fractionated), no boost    PAST SURGICAL HISTORY: Past Surgical History  Procedure Laterality Date  . Cystocele repair  2008  . Rectocele repair  2008  . Tonsillectomy    . Cataract extraction  2013    both  . Appendectomy  1954  . Eye surgery      cataracts-plugs for eyes  . Back surgery      lumb-2005  . Breast lumpectomy with needle localization Right 11/09/2013    Procedure: BREAST LUMPECTOMY WITH NEEDLE LOCALIZATION;  Surgeon: Harl Bowie, MD;  Location: [facility];  Service: General;  Laterality: Right;  . Abdominal hysterectomy  1973    endometriosis  . Bilateral oophorectomy      benign tumor on ovary    FAMILY HISTORY Family History  Problem Relation Age of Onset  . Alzheimer's disease Mother   . Heart attack Neg Hx   . Stroke Father    the patient's father died from COPD at age 65 in the setting of tobacco abuse.  The patient's mother died at the age of 4 with Alzheimer's disease. The patient had no brothers, one sister. There is no history of breast or ovarian cancer in the family to her knowledge  GYNECOLOGIC HISTORY:  Menarche age 52, first live birth age 51. The patient is GX P3. She underwent simple hysterectomy in 1973 and took hormone replacement (initially Prempro later estrogens only) until December 2014. 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 Her husband Deidre Ala was an Chief Financial Officer for [employer]. He died 23-Apr-2015 from prostate cancer.Their son Christia Reading lives in [place]. where he works in Engineer, technical sales. Daughter Fanny Bien lives in [place] where she works as a Designer, jewellery in an independent clinic. Daughter Norberto Sorenson listened [employer]. She works as a Tree surgeon. The patient has 7 grandchildren, 2 of whom are getting married in the winter of 2016. She is a Tourist information centre manager.    ADVANCED DIRECTIVES: In place; the patient has named her daughter, Fanny Bien, is a Designer, jewellery, as her healthcare power of attorney. And can be reached at [phone].   HEALTH MAINTENANCE: Social History  Substance Use Topics  . Smoking status: Never Smoker   . Smokeless tobacco: Never Used  . Alcohol Use: No     Colonoscopy:  PAP:  Bone density: At [facility] 09/27/2010, normal  Lipid panel:  Allergies  Allergen Reactions  . Codeine     nausea    Current Outpatient Prescriptions  Medication Sig Dispense Refill  . acetaminophen (TYLENOL) 650 MG CR tablet Take 650 mg by mouth every 8 (eight) hours as needed for pain.    [name] ALPRAZolam (XANAX) 0.5 MG tablet Take 0.5 mg by mouth at bedtime as needed for anxiety.    [name] anastrozole (ARIMIDEX) 1 MG tablet Take 1 tablet (1 mg total) by mouth daily. 90 tablet 4  . calcium carbonate (OS-CAL) 600 MG TABS tablet Take 600 mg by mouth 2 (two) times daily with a meal.    . CARTIA XT 120 MG 24 hr capsule Take 120 mg by mouth daily.     . cholecalciferol (VITAMIN D) 400 UNITS TABS tablet Take 2,000 Units by mouth.     . cycloSPORINE (RESTASIS) 0.05 % ophthalmic emulsion 1 drop 2 (two) times daily.    . fexofenadine (ALLEGRA) 180 MG tablet Take 180 mg by mouth daily. 1/2 tab in am   1 tab in pm    . flecainide (TAMBOCOR) 100 MG tablet Take 1 tablet (100 mg total) by mouth 2 (two) times daily. 180 tablet 1  . fluticasone (FLONASE) 50  MCG/ACT nasal spray Place into both nostrils daily.     [name] glucosamine-chondroitin 500-400 MG tablet Take 1 tablet by mouth 2 (two) times daily.     [name] guaiFENesin (MUCINEX) 600 MG 12 hr tablet Take by mouth as needed for cough or to loosen phlegm.     . rivaroxaban (XARELTO) 20 MG TABS tablet Take 1 tablet (20 mg total) by mouth daily with supper.    . sodium chloride (OCEAN) 0.65 % nasal spray Place 1 spray into the nose as needed for congestion.    . trolamine salicylate (ASPERCREME) 10 % cream Apply 1 application topically as needed for muscle pain.    . vitamin E 400 UNIT capsule Take 400 Units by mouth daily.  No current facility-administered medications for this visit.    OBJECTIVE: Older white woman in no acute distress Filed Vitals:   09/21/15 1300  BP: 137/74  Pulse: 61  Temp: 98 F (36.7 C)  Resp: 18     Body mass index is 24.3 kg/(m^2).    ECOG FS:0 - Asymptomatic  Sclerae unicteric, pupils round and equal Oropharynx clear and moist-- no thrush or other lesions No cervical or supraclavicular adenopathy Lungs no rales or rhonchi Heart regular rate and rhythm Abd soft, nontender, positive bowel sounds MSK no focal spinal tenderness, no upper extremity lymphedema Neuro: nonfocal, well oriented, appropriate affect Breasts: Right breast is status post lumpectomy and radiation. There is no evidence of local recurrence. The right axilla is benign per the left breast is unremarkable.    LAB RESULTS:  CMP     Component Value Date/Time   NA 138 10/13/2014 1017   NA 139 11/09/2013 1200   K 4.1 10/13/2014 1017   K 3.5 11/09/2013 1200   CL 104 11/09/2013 1200   CO2 28 10/13/2014 1017   CO2 28 11/09/2013 1200   GLUCOSE 91 10/13/2014 1017   GLUCOSE 86 11/09/2013 1200   BUN 19.2 10/13/2014 1017   BUN 14 11/09/2013 1200   CREATININE 1.0 10/13/2014 1017   CREATININE 0.89 11/09/2013 1200   CALCIUM 9.4 10/13/2014 1017   CALCIUM 8.9 11/09/2013 1200   PROT 6.6 10/13/2014  1017   ALBUMIN 4.0 10/13/2014 1017   AST 18 10/13/2014 1017   ALT 14 10/13/2014 1017   ALKPHOS 71 10/13/2014 1017   BILITOT 0.47 10/13/2014 1017   GFRNONAA 60* 11/09/2013 1200   GFRAA 70* 11/09/2013 1200    I No results found for: SPEP  Lab Results  Component Value Date   WBC 4.8 09/21/2015   NEUTROABS 3.1 09/21/2015   HGB 11.5* 09/21/2015   HCT 33.9* 09/21/2015   MCV 94.1 09/21/2015   PLT 206 09/21/2015      Chemistry      Component Value Date/Time   NA 138 10/13/2014 1017   NA 139 11/09/2013 1200   K 4.1 10/13/2014 1017   K 3.5 11/09/2013 1200   CL 104 11/09/2013 1200   CO2 28 10/13/2014 1017   CO2 28 11/09/2013 1200   BUN 19.2 10/13/2014 1017   BUN 14 11/09/2013 1200   CREATININE 1.0 10/13/2014 1017   CREATININE 0.89 11/09/2013 1200      Component Value Date/Time   CALCIUM 9.4 10/13/2014 1017   CALCIUM 8.9 11/09/2013 1200   ALKPHOS 71 10/13/2014 1017   AST 18 10/13/2014 1017   ALT 14 10/13/2014 1017   BILITOT 0.47 10/13/2014 1017       No results found for: LABCA2  No components found for: OACZY606  No results for input(s): INR in the last 168 hours.  Urinalysis No results found for: COLORURINE  STUDIES: Mammography due December 2015  ASSESSMENT: 81 y.o. [place] woman  (1) Status post right lumpectomy 11/09/2013 for a pT1c NX, stage IA invasive ductal carcinoma, grade 2, estrogen receptor 100% positive, progesterone receptor 94% positive, with an MIB-1 of 10%, and no HER-2 amplification.  (2) adjuvant radiation completed February 2015.  (3) anastrozole started February 2015  (a) bone density at [facility] for 16 2015 shows mild osteopenia with a T score of -1.3  (4) the patient is status post hysterectomy and bilateral salpingo-oophorectomy  (5) atrial fibrillation, on Rivaroxaban  PLAN: Nicolina is doing terrific now 2 years out from her  definitive surgery with no evidence of recurrence. She is tolerating the anastrozole well except for hot  flashes.  Today we discussed various options for hot flashes and she is going to start venlafaxine at 37.5 mg daily. If she tolerates that well and does not have significant improvement and she can increase it to 75 mg. In that case she would let us know so we can call her in a 75 mg tablet to make it easier for her.  If this does not work we will discuss clonidine  She sees her primary care doctor November and May. Regarding to see her yearly in August until she completes her 5 years of follow-up.  She will be due for a bone density June of next year. That order has been entered  She knows to call for any problems that may develop before her next visit.    Chauncey Cruel, MD   09/21/2015 1:10 PM

## 2015-11-03 ENCOUNTER — Telehealth: Payer: Self-pay | Admitting: *Deleted

## 2015-11-03 NOTE — Telephone Encounter (Signed)
Patient called at 2:09 pm reporting "I've already had three hot flashes this morning.  I take venlafaxine ER 37.5 mg.  A month ago I was old to take two daily.  It's not working.  Hot flashes may be gradually getting worse, particularly at night.  Return number [phone].  I use [employer] on [employer]."

## 2015-11-04 ENCOUNTER — Other Ambulatory Visit: Payer: Self-pay

## 2015-11-04 DIAGNOSIS — C50411 Malignant neoplasm of upper-outer quadrant of right female breast: Secondary | ICD-10-CM

## 2015-11-04 DIAGNOSIS — N951 Menopausal and female climacteric states: Secondary | ICD-10-CM

## 2015-11-04 MED ORDER — GABAPENTIN 100 MG PO CAPS: 100.0000 mg | ORAL_CAPSULE | Freq: Every day | ORAL | Status: DC

## 2015-11-04 NOTE — Telephone Encounter (Signed)
Per Dr. Jana Hakim patient can try gabapentin 100 mg at bedtime for hot flashes.  Effexor ER 37.5 mg two daily are no longer working.  Patient stated understanding and will call next week to update at which time we can discuss weaning her off the effexor if the gabapentin is working.

## 2015-11-06 ENCOUNTER — Other Ambulatory Visit: Payer: Self-pay | Admitting: Oncology

## 2015-11-06 NOTE — Telephone Encounter (Signed)
Patient to continue venlafaxine at 75 mg/day (please call script for that dose) and increase night-time gabapentin to 100 mg Qhs  Call us after 2 weeks if no improvement

## 2015-11-08 NOTE — Telephone Encounter (Signed)
Call Documentation      Lujean Amel, RN at 11/04/2015 3:41 PM     Status: Signed       Expand All Collapse All   Per Dr. Jana Hakim patient can try gabapentin 100 mg at bedtime for hot flashes. Effexor ER 37.5 mg two daily are no longer working. Patient stated understanding and will call next week to update at which time we can discuss weaning her off the effexor if the gabapentin is working.

## 2015-11-15 ENCOUNTER — Telehealth: Payer: Self-pay

## 2015-11-15 NOTE — Telephone Encounter (Signed)
Patient called to update Dr. Jana Hakim on her hot flashes.  She has been on the gabapentin 100 mg since 11/04/15 along with effexor 37.5 mg 2 tabs daily.  The gabapentin is making her very tired, she is really having a hard time getting out of bed in the morning and remains groggy most of the day, although it has improved her hot flashes.   She does not wish to stay on this medication.  Patient is wondering whether she could increase the effexor one more time because it does seem to help although at this dose she continues to experience hot flashes.

## 2015-11-17 NOTE — Telephone Encounter (Signed)
This RN spoke with pt per her call today to TRIAGE-  Discussed medications and hot flashes.  Per call plan is pt will stop ( actually has stopped ) the gabapentin.  She will take an effexor 37.5mg  in the pm in addition to her current dose of 75mg  in the am.  She will monitor symptoms for 1 week and call this office with update.  No further needs at this time.

## 2015-12-08 DIAGNOSIS — Z853 Personal history of malignant neoplasm of breast: Secondary | ICD-10-CM | POA: Diagnosis not present

## 2015-12-29 NOTE — Progress Notes (Signed)
Cardiology Office Note   Date:  12/30/2015   ID:  Katherine Powell, DOB 1932/08/05, MRN [id number]  PCP:  Cari Caraway, MD  Cardiologist:  Dr. Tamala Julian    1 year follow up   History of Present Illness: Katherine Powell is a 82 y.o. female with a history of PAF on Flecainide and Xarelto, HTN and breast cancer.   Diagnosed with ER+ breast cancer in 2014 and had surgery and radiation. She is on Arimidex. Last seen by Dr. Tamala Julian on 12/29/14 and felt to be doing well from a cardiac standpoint.   Today she presents to clinic for yearly visit. She has been noticing that she has felt herself lately. Very fatigued since December. No recent fevers, chills or illness. No CP. She exercises 4 days a week with yoga and weights with no CP or SOB. She sometimes gets a little "hurt" under her left axilla/left breast that she thinks is MSK related to work outs- it doesn't bother her. No LE edema, orthopnea, or PND. She has some mild dizziness when exercising but no syncope. She has not had any afib since last October, but no real problems with this.    Past Medical History  Diagnosis Date  . Arrhythmia     atrial fibrillation/  on   . Hypertension   . Rosacea   . Atrial fibrillation ([place])   . Postmenopausal   . Sleep disturbance   . Thyroid disease   . Urinary incontinence   . Contact lens/glasses fitting     contact rt eye  . Seasonal allergies   . Arthritis   . Anxiety   . Breast cancer ([place]) 11/09/13    right upper outer  . Hx of radiation therapy 12/15/13- 01/11/14    right breast 4250 cGy in 17 sessions, (hypo-fractionated), no boost    Past Surgical History  Procedure Laterality Date  . Cystocele repair  2008  . Rectocele repair  2008  . Tonsillectomy    . Cataract extraction  2013    both  . Appendectomy  1954  . Eye surgery      cataracts-plugs for eyes  . Back surgery      lumb-2005  . Breast lumpectomy with needle localization Right 11/09/2013    Procedure: BREAST LUMPECTOMY  WITH NEEDLE LOCALIZATION;  Surgeon: Harl Bowie, MD;  Location: [facility];  Service: General;  Laterality: Right;  . Abdominal hysterectomy  1973    endometriosis  . Bilateral oophorectomy      benign tumor on ovary     Current Outpatient Prescriptions  Medication Sig Dispense Refill  . acetaminophen (TYLENOL) 650 MG CR tablet Take 650 mg by mouth every 8 (eight) hours as needed for pain.    [name] ALPRAZolam (XANAX) 0.5 MG tablet Take 0.5 mg by mouth at bedtime as needed for anxiety.    [name] anastrozole (ARIMIDEX) 1 MG tablet Take 1 tablet (1 mg total) by mouth daily. 90 tablet 4  . calcium carbonate (OS-CAL) 600 MG TABS tablet Take 600 mg by mouth 2 (two) times daily with a meal.    . CARTIA XT 120 MG 24 hr capsule Take 120 mg by mouth daily.     . cholecalciferol (VITAMIN D) 400 UNITS TABS tablet Take 2,000 Units by mouth.     . cycloSPORINE (RESTASIS) 0.05 % ophthalmic emulsion Place 1 drop into both eyes continuous as needed.    . fexofenadine (ALLEGRA) 180 MG tablet Take 180 mg by  mouth continuous as needed for allergies or rhinitis.    . flecainide (TAMBOCOR) 100 MG tablet Take 1 tablet (100 mg total) by mouth 2 (two) times daily. 180 tablet 1  . fluticasone (FLONASE) 50 MCG/ACT nasal spray Place into both nostrils daily.     [name] glucosamine-chondroitin 500-400 MG tablet Take 1 tablet by mouth 2 (two) times daily.     [name] guaiFENesin (MUCINEX) 600 MG 12 hr tablet Take by mouth as needed for cough or to loosen phlegm.     . rivaroxaban (XARELTO) 20 MG TABS tablet Take 1 tablet (20 mg total) by mouth daily with supper.    . sodium chloride (OCEAN) 0.65 % nasal spray Place 1 spray into the nose as needed for congestion.    . trolamine salicylate (ASPERCREME) 10 % cream Apply 1 application topically as needed for muscle pain.    [name] venlafaxine (EFFEXOR) 37.5 MG tablet Take 37.5 mg by mouth 2 (two) times daily with a meal.    . vitamin E 400 UNIT capsule Take 400 Units by mouth  daily.     No current facility-administered medications for this visit.    Allergies:   Codeine    Social History:  The patient  reports that she has never smoked. She has never used smokeless tobacco. She reports that she does not drink alcohol or use illicit drugs.   Family History:  The patient's family history includes Alzheimer's disease in her mother; Stroke in her father. There is no history of Heart attack.    ROS:  Please see the history of present illness.   Otherwise, review of systems are positive for none.   All other systems are reviewed and negative.    PHYSICAL EXAM: VS:  BP 142/80 mmHg  Pulse 64  Ht 5' 6.5" (1.689 m)  Wt 153 lb (69.4 kg)  BMI 24.33 kg/m2 , BMI Body mass index is 24.33 kg/(m^2). GEN: Well nourished, well developed, in no acute distress HEENT: normal Neck: no JVD, carotid bruits, or masses Cardiac: RRR; no murmurs, rubs, or gallops,no edema  Respiratory:  clear to auscultation bilaterally, normal work of breathing GI: soft, nontender, nondistended, + BS MS: no deformity or atrophy Skin: warm and dry, no rash Neuro:  Strength and sensation are intact Psych: euthymic mood, full affect   EKG:  EKG is ordered today. The ekg ordered today demonstrates sinus bradycardia HR 58   Recent Labs: 09/21/2015: ALT 12; BUN 14.7; Creatinine 0.9; HGB 11.5*; Platelets 206; Potassium 4.1; Sodium 138    Lipid Panel No results found for: CHOL, TRIG, HDL, CHOLHDL, VLDL, LDLCALC, LDLDIRECT    Wt Readings from Last 3 Encounters:  12/30/15 153 lb (69.4 kg)  09/21/15 152 lb 12.8 oz (69.31 kg)  12/29/14 150 lb (68.04 kg)      Other studies Reviewed: Additional studies/ records that were reviewed today include: ETT, 2D ECHO Review of the above records demonstrates:   ETT (2014) for Flecainide: normal    2D ECHO: 09/08/2013 LV EF: 60% -  65% Study Conclusions - Left ventricle: The cavity size was normal. Systolic function was normal. The estimated  ejection fraction was in the range of 60% to 65%. Wall motion was normal; there were no regional wall motion abnormalities. - Ventricular septum: The outflow septum had a sigmoid appearance. - Aortic valve: Mild regurgitation. - Mitral valve: Mild regurgitation.  ASSESSMENT AND PLAN:  Katherine Powell is a 82 y.o. female with a history of PAF on Flecainide and  Xarelto, HTN and breast cancer.   PAF: maintaining NSR today. Continue Flecainide 100mg  BID, diltiazem SR 120mg  daily and xarelto ( CHADVASC score of at least 4 ( HTN, F sex age). Will update 2D ECHO since she has no had once since 2014 and has now undergone radiation for [facility].   HTN: BP 142/80. Continue current regmien  Fatigue: will get a CBC and TSH.   Current medicines are reviewed at length with the patient today.  The patient does not have concerns regarding medicines.  The following changes have been made:  no change  Labs/ tests ordered today include:   Orders Placed This Encounter  Procedures  . TSH  . CBC with Differential  . Basic Metabolic Panel (BMET)  . EKG 12-Lead  . Echocardiogram    Disposition:   FU with Dr. Tamala Julian in 6- 12 months   Signed, Crista Luria  12/30/2015 11:47 AM    [address], [place], [place]  [address] Phone: [phone]; Fax: [phone]

## 2015-12-30 ENCOUNTER — Encounter: Payer: Self-pay | Admitting: Physician Assistant

## 2015-12-30 ENCOUNTER — Other Ambulatory Visit: Payer: Self-pay | Admitting: Oncology

## 2015-12-30 ENCOUNTER — Ambulatory Visit (INDEPENDENT_AMBULATORY_CARE_PROVIDER_SITE_OTHER): Payer: PPO | Admitting: Physician Assistant

## 2015-12-30 ENCOUNTER — Telehealth: Payer: Self-pay | Admitting: *Deleted

## 2015-12-30 VITALS — BP 142/80 | HR 64 | Ht 66.5 in | Wt 153.0 lb

## 2015-12-30 DIAGNOSIS — Z7901 Long term (current) use of anticoagulants: Secondary | ICD-10-CM | POA: Diagnosis not present

## 2015-12-30 DIAGNOSIS — I480 Paroxysmal atrial fibrillation: Secondary | ICD-10-CM | POA: Diagnosis not present

## 2015-12-30 LAB — BASIC METABOLIC PANEL
BUN: 21 mg/dL (ref 7–25)
CO2: 28 mmol/L (ref 20–31)
Calcium: 9 mg/dL (ref 8.6–10.4)
Chloride: 102 mmol/L (ref 98–110)
Creat: 0.92 mg/dL — ABNORMAL HIGH (ref 0.60–0.88)
Glucose, Bld: 85 mg/dL (ref 65–99)
Potassium: 4.3 mmol/L (ref 3.5–5.3)
Sodium: 135 mmol/L (ref 135–146)

## 2015-12-30 LAB — CBC WITH DIFFERENTIAL/PLATELET
Basophils Absolute: 0 10*3/uL (ref 0.0–0.1)
Basophils Relative: 0 % (ref 0–1)
Eosinophils Absolute: 0.1 10*3/uL (ref 0.0–0.7)
Eosinophils Relative: 3 % (ref 0–5)
HCT: 37.1 % (ref 36.0–46.0)
Hemoglobin: 12.2 g/dL (ref 12.0–15.0)
Lymphocytes Relative: 29 % (ref 12–46)
Lymphs Abs: 1.4 10*3/uL (ref 0.7–4.0)
MCH: 31.4 pg (ref 26.0–34.0)
MCHC: 32.9 g/dL (ref 30.0–36.0)
MCV: 95.6 fL (ref 78.0–100.0)
MPV: 9.6 fL (ref 8.6–12.4)
Monocytes Absolute: 0.5 10*3/uL (ref 0.1–1.0)
Monocytes Relative: 10 % (ref 3–12)
Neutro Abs: 2.7 10*3/uL (ref 1.7–7.7)
Neutrophils Relative %: 58 % (ref 43–77)
Platelets: 222 10*3/uL (ref 150–400)
RBC: 3.88 MIL/uL (ref 3.87–5.11)
RDW: 13.6 % (ref 11.5–15.5)
WBC: 4.7 10*3/uL (ref 4.0–10.5)

## 2015-12-30 LAB — TSH: TSH: 1.215 u[IU]/mL (ref 0.350–4.500)

## 2015-12-30 NOTE — Telephone Encounter (Signed)
This RN spoke with pt per her stating recurrence of hot flashes since last telephone conversation.  Per last discussion in December pt increased the venlafaxine - she discontinued the gabapentin due to severe drowsiness.  Hot flashes improved for approximately 1 month and now has resumed with interference in ADL's including sleep.  Per above discussion this RN informed pt concerns need to be addressed per visit for better overall decisions.  Pt has failed general over the phone recommendations.  POF sent to scheduling for appointment with HB/NP on day MD is in the office.

## 2015-12-30 NOTE — Patient Instructions (Addendum)
Medication Instructions:  Your physician recommends that you continue on your current medications as directed. Please refer to the Current Medication list given to you today.   Labwork: TODAY:  TSH                  CBC W/DIFF                  BMET  Testing/Procedures: Your physician has requested that you have an echocardiogram. Echocardiography is a painless test that uses sound waves to create images of your heart. It provides your doctor with information about the size and shape of your heart and how well your heart's chambers and valves are working. This procedure takes approximately one hour. There are no restrictions for this procedure.   Follow-Up: Your physician recommends that you schedule a follow-up appointment in: [place]   Any Other Special Instructions Will Be Listed Below (If Applicable).   If you need a refill on your cardiac medications before your next appointment, please call your pharmacy.

## 2016-01-02 ENCOUNTER — Telehealth: Payer: Self-pay | Admitting: Oncology

## 2016-01-02 NOTE — Telephone Encounter (Signed)
Left message for patient re appointment for 2/2.

## 2016-01-03 ENCOUNTER — Telehealth: Payer: Self-pay | Admitting: Nurse Practitioner

## 2016-01-03 NOTE — Telephone Encounter (Signed)
Patient called in to reschedule her appointment °

## 2016-01-05 ENCOUNTER — Ambulatory Visit: Payer: PPO | Admitting: Nurse Practitioner

## 2016-01-10 ENCOUNTER — Telehealth: Payer: Self-pay | Admitting: Oncology

## 2016-01-10 NOTE — Telephone Encounter (Signed)
Returned patient's call re r/s 2/8 f/u to next week or any other time besides tomorrow. Left message for patient re new appointment for 2/16. Schedule mailed.

## 2016-01-11 ENCOUNTER — Ambulatory Visit (HOSPITAL_COMMUNITY): Payer: PPO | Attending: Physician Assistant

## 2016-01-11 ENCOUNTER — Ambulatory Visit: Payer: PPO | Admitting: Nurse Practitioner

## 2016-01-11 ENCOUNTER — Other Ambulatory Visit: Payer: Self-pay

## 2016-01-11 DIAGNOSIS — I351 Nonrheumatic aortic (valve) insufficiency: Secondary | ICD-10-CM | POA: Diagnosis not present

## 2016-01-11 DIAGNOSIS — I517 Cardiomegaly: Secondary | ICD-10-CM | POA: Insufficient documentation

## 2016-01-11 DIAGNOSIS — I071 Rheumatic tricuspid insufficiency: Secondary | ICD-10-CM | POA: Insufficient documentation

## 2016-01-11 DIAGNOSIS — I480 Paroxysmal atrial fibrillation: Secondary | ICD-10-CM | POA: Diagnosis not present

## 2016-01-11 DIAGNOSIS — I5189 Other ill-defined heart diseases: Secondary | ICD-10-CM | POA: Insufficient documentation

## 2016-01-11 DIAGNOSIS — I4891 Unspecified atrial fibrillation: Secondary | ICD-10-CM | POA: Diagnosis not present

## 2016-01-11 DIAGNOSIS — I340 Nonrheumatic mitral (valve) insufficiency: Secondary | ICD-10-CM | POA: Diagnosis not present

## 2016-01-12 ENCOUNTER — Telehealth: Payer: Self-pay | Admitting: Interventional Cardiology

## 2016-01-12 NOTE — Telephone Encounter (Signed)
New message    Pt states she is returning rn call

## 2016-01-12 NOTE — Telephone Encounter (Signed)
Notified of echo results.

## 2016-01-17 DIAGNOSIS — M75102 Unspecified rotator cuff tear or rupture of left shoulder, not specified as traumatic: Secondary | ICD-10-CM | POA: Diagnosis not present

## 2016-01-19 ENCOUNTER — Ambulatory Visit (HOSPITAL_BASED_OUTPATIENT_CLINIC_OR_DEPARTMENT_OTHER): Payer: PPO | Admitting: Nurse Practitioner

## 2016-01-19 ENCOUNTER — Encounter: Payer: Self-pay | Admitting: Nurse Practitioner

## 2016-01-19 ENCOUNTER — Telehealth: Payer: Self-pay | Admitting: Oncology

## 2016-01-19 VITALS — BP 131/62 | HR 73 | Temp 97.6°F | Resp 18 | Wt 154.8 lb

## 2016-01-19 DIAGNOSIS — M8580 Other specified disorders of bone density and structure, unspecified site: Secondary | ICD-10-CM

## 2016-01-19 DIAGNOSIS — N951 Menopausal and female climacteric states: Secondary | ICD-10-CM | POA: Diagnosis not present

## 2016-01-19 DIAGNOSIS — I4891 Unspecified atrial fibrillation: Secondary | ICD-10-CM | POA: Diagnosis not present

## 2016-01-19 DIAGNOSIS — C50811 Malignant neoplasm of overlapping sites of right female breast: Secondary | ICD-10-CM | POA: Diagnosis not present

## 2016-01-19 DIAGNOSIS — Z170 Estrogen receptor positive status [ER+]: Secondary | ICD-10-CM

## 2016-01-19 DIAGNOSIS — C50411 Malignant neoplasm of upper-outer quadrant of right female breast: Secondary | ICD-10-CM

## 2016-01-19 MED ORDER — VENLAFAXINE HCL 37.5 MG PO TABS: 37.5000 mg | ORAL_TABLET | Freq: Two times a day (BID) | ORAL | Status: DC

## 2016-01-19 MED ORDER — CLONIDINE HCL 0.1 MG/24HR TD PTWK: 0.1000 mg | MEDICATED_PATCH | TRANSDERMAL | Status: DC

## 2016-01-19 MED ORDER — CLONIDINE HCL 0.1 MG/24HR TD PTWK
0.1000 mg | MEDICATED_PATCH | TRANSDERMAL | Status: DC
Start: 2016-01-19 — End: 2016-03-07

## 2016-01-19 NOTE — Telephone Encounter (Signed)
Appointments made and avs printed °

## 2016-01-19 NOTE — Progress Notes (Signed)
Katherine Powell  [phone] [phone]   ID: [name] OB: 1932/04/22  MR#: [id number]  TGG#:[id number]  PCP: Cari Caraway, MD GYN:   SUCoralie Keens OTHER MD: Arloa Koh, Christene Slates, Rob Hickman, Daneen Schick  CHIEF COMPLAINT: Estrogen receptor positive breast cancer  Current treatment: Anastrozole  HISTORY OF PRESENT ILLNESS: From the original intake note:  Katherine Powell had routine screening mammography at [facility] 10/14/2013 showing a 1 cm irregular mass in the right breast. Right digital mammography and ultrasonography of the right breast 10/21/2013 showed a mass with indistinct margins at the 12:00 position which by ultrasound measured 1 cm, and was lobulated. Biopsy was recommended, but the patient opted to proceed directly with lumpectomy, and this was performed 11/09/2013. The final pathology ([id number]) showed an invasive ductal carcinoma, grade 2, measuring 1.1 cm, with low-grade ductal carcinoma in situ. The tumor was estrogen receptor 100% positive, and progesterone receptor 94% positive, both with strong staining intensity. The MIB-1 was 10%. There was no HER-2 amplification with a ratio by CISH of 0.90, and the number per cell being 1.80.  The patient's subsequent history is as detailed below   INTERVAL HISTORY: [place] today for follow-up of her breast cancer. She continues on anastrozole, which she is tolerating well except for hot flashes. She is here specifically for that reason. She would like to try something else for these. She is having up to 5 hot flashes daily despite '75mg'$  of venlafaxine in the morning and another 37.'5mg'$  in the evening.   REVIEW OF SYSTEMS: She continues to exercise regularly, doing yoga about 4 days a week. A detailed review of systems today was otherwise negative except as noted above.   PAST MEDICAL HISTORY: Past Medical History  Diagnosis Date  . Arrhythmia     atrial fibrillation/  on   .  Hypertension   . Rosacea   . Atrial fibrillation ([place])   . Postmenopausal   . Sleep disturbance   . Thyroid disease   . Urinary incontinence   . Contact lens/glasses fitting     contact rt eye  . Seasonal allergies   . Arthritis   . Anxiety   . Breast cancer ([place]) 11/09/13    right upper outer  . Hx of radiation therapy 12/15/13- 01/11/14    right breast 4250 cGy in 17 sessions, (hypo-fractionated), no boost    PAST SURGICAL HISTORY: Past Surgical History  Procedure Laterality Date  . Cystocele repair  2008  . Rectocele repair  2008  . Tonsillectomy    . Cataract extraction  2013    both  . Appendectomy  1954  . Eye surgery      cataracts-plugs for eyes  . Back surgery      lumb-2005  . Breast lumpectomy with needle localization Right 11/09/2013    Procedure: BREAST LUMPECTOMY WITH NEEDLE LOCALIZATION;  Surgeon: Harl Bowie, MD;  Location: [place];  Service: General;  Laterality: Right;  . Abdominal hysterectomy  1973    endometriosis  . Bilateral oophorectomy      benign tumor on ovary    FAMILY HISTORY Family History  Problem Relation Age of Onset  . Alzheimer's disease Mother   . Heart attack Neg Hx   . Stroke Father    the patient's father died from COPD at age 67 in the setting of tobacco abuse. The patient's mother died at the age of 67 with Alzheimer's disease. The patient had no brothers, one  sister. There is no history of breast or ovarian cancer in the family to her knowledge  GYNECOLOGIC HISTORY:  Menarche age 54, first live birth age 51. The patient is GX P3. She underwent simple hysterectomy in 1973 and took hormone replacement (initially Prempro later estrogens only) until December 2014. 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 Her husband Deidre Ala was  an Chief Financial Officer for [employer]. He died 05-03-15 from prostate cancer.Their son Christia Reading lives in [place]. where he works in Engineer, technical sales. Daughter Fanny Bien lives in [place] where she works as a Designer, jewellery in an independent clinic. Daughter Norberto Sorenson listened [place]. She works as a Tree surgeon. The patient has 7 grandchildren, 2 of whom are getting married in the winter of 2016. She is a Tourist information centre manager.    ADVANCED DIRECTIVES: In place; the patient has named her daughter, Fanny Bien, is a Designer, jewellery, as her healthcare power of attorney. And can be reached at [phone].   HEALTH MAINTENANCE: Social History  Substance Use Topics  . Smoking status: Never Smoker   . Smokeless tobacco: Never Used  . Alcohol Use: No     Colonoscopy:  PAP:  Bone density: At [facility] 09/27/2010, normal  Lipid panel:  Allergies  Allergen Reactions  . Codeine Nausea Only    nausea    Current Outpatient Prescriptions  Medication Sig Dispense Refill  . ALPRAZolam (XANAX) 0.5 MG tablet Take 0.5 mg by mouth at bedtime as needed for anxiety.    [name] anastrozole (ARIMIDEX) 1 MG tablet TAKE 1 TABLET BY MOUTH DAILY 90 tablet 1  . calcium carbonate (OS-CAL) 600 MG TABS tablet Take 600 mg by mouth 2 (two) times daily with a meal.    . CARTIA XT 120 MG 24 hr capsule Take 120 mg by mouth daily.     . cholecalciferol (VITAMIN D) 400 UNITS TABS tablet Take 2,000 Units by mouth.     . cycloSPORINE (RESTASIS) 0.05 % ophthalmic emulsion Place 1 drop into both eyes continuous as needed.    . fexofenadine (ALLEGRA) 180 MG tablet Take 180 mg by mouth continuous as needed for allergies or rhinitis.    . flecainide (TAMBOCOR) 100 MG tablet Take 1 tablet (100 mg total) by mouth 2 (two) times daily. 180 tablet 1  . glucosamine-chondroitin 500-400 MG tablet Take 1 tablet by mouth 2 (two) times daily.     . rivaroxaban (XARELTO) 20 MG TABS tablet Take 1 tablet (20 mg total) by mouth daily with supper.    .  trolamine salicylate (ASPERCREME) 10 % cream Apply 1 application topically as needed for muscle pain.    [name] venlafaxine (EFFEXOR) 37.5 MG tablet Take 1 tablet (37.5 mg total) by mouth 2 (two) times daily with a meal. Pt takes 75 mg in the am and 37.5 mg in the pm. 30 tablet 0  . vitamin E 400 UNIT capsule Take 400 Units by mouth daily.    [name] acetaminophen (TYLENOL) 650 MG CR tablet Take 650 mg by mouth every 8 (eight) hours as needed for pain. Reported on 01/19/2016    . cloNIDine (CATAPRES - DOSED IN MG/24 HR) 0.1 mg/24hr patch Place 1 patch (0.1 mg total) onto the skin once a week. 30 patch 3  . fluticasone (FLONASE) 50 MCG/ACT nasal spray Place into both nostrils daily. Reported  on 01/19/2016    . sodium chloride (OCEAN) 0.65 % nasal spray Place 1 spray into the nose as needed for congestion. Reported on 01/19/2016     No current facility-administered medications for this visit.    OBJECTIVE: Older white woman in no acute distress Filed Vitals:   01/19/16 1428  BP: 131/62  Pulse: 73  Temp: 97.6 F (36.4 C)  Resp: 18     Body mass index is 24.61 kg/(m^2).    ECOG FS:0 - Asymptomatic  Skin: warm, dry  HEENT: sclerae anicteric, conjunctivae pink, oropharynx clear. No thrush or mucositis.  Lymph Nodes: No cervical or supraclavicular lymphadenopathy  Lungs: clear to auscultation bilaterally, no rales, wheezes, or rhonci  Heart: regular rate and rhythm  Abdomen: round, soft, non tender, positive bowel sounds  Musculoskeletal: No focal spinal tenderness, no peripheral edema  Neuro: non focal, well oriented, positive affect  Breasts: deferred  LAB RESULTS:  CMP     Component Value Date/Time   NA 135 12/30/2015 1132   NA 138 09/21/2015 1232   K 4.3 12/30/2015 1132   K 4.1 09/21/2015 1232   CL 102 12/30/2015 1132   CO2 28 12/30/2015 1132   CO2 25 09/21/2015 1232   GLUCOSE 85 12/30/2015 1132   GLUCOSE 96 09/21/2015 1232   BUN 21 12/30/2015 1132   BUN 14.7 09/21/2015 1232    CREATININE 0.92* 12/30/2015 1132   CREATININE 0.9 09/21/2015 1232   CREATININE 0.89 11/09/2013 1200   CALCIUM 9.0 12/30/2015 1132   CALCIUM 9.3 09/21/2015 1232   PROT 6.2* 09/21/2015 1232   ALBUMIN 3.8 09/21/2015 1232   AST 18 09/21/2015 1232   ALT 12 09/21/2015 1232   ALKPHOS 86 09/21/2015 1232   BILITOT 0.37 09/21/2015 1232   GFRNONAA 60* 11/09/2013 1200   GFRAA 70* 11/09/2013 1200    I No results found for: SPEP  Lab Results  Component Value Date   WBC 4.7 12/30/2015   NEUTROABS 2.7 12/30/2015   HGB 12.2 12/30/2015   HCT 37.1 12/30/2015   MCV 95.6 12/30/2015   PLT 222 12/30/2015      Chemistry      Component Value Date/Time   NA 135 12/30/2015 1132   NA 138 09/21/2015 1232   K 4.3 12/30/2015 1132   K 4.1 09/21/2015 1232   CL 102 12/30/2015 1132   CO2 28 12/30/2015 1132   CO2 25 09/21/2015 1232   BUN 21 12/30/2015 1132   BUN 14.7 09/21/2015 1232   CREATININE 0.92* 12/30/2015 1132   CREATININE 0.9 09/21/2015 1232   CREATININE 0.89 11/09/2013 1200      Component Value Date/Time   CALCIUM 9.0 12/30/2015 1132   CALCIUM 9.3 09/21/2015 1232   ALKPHOS 86 09/21/2015 1232   AST 18 09/21/2015 1232   ALT 12 09/21/2015 1232   BILITOT 0.37 09/21/2015 1232       No results found for: LABCA2  No components found for: LABCA125  No results for input(s): INR in the last 168 hours.  Urinalysis No results found for: COLORURINE  STUDIES: No results found.   Mammogram at [facility] late January 2017. Unremarkable per patient.    ASSESSMENT: 82 y.o. [place] woman  (1) Status post right lumpectomy 11/09/2013 for a pT1c NX, stage IA invasive ductal carcinoma, grade 2, estrogen receptor 100% positive, progesterone receptor 94% positive, with an MIB-1 of 10%, and no HER-2 amplification.  (2) adjuvant radiation completed February 2015.  (3) anastrozole started February 2015  (a) bone density  at [facility] for 16 2015 shows mild osteopenia with a T score of -1.3  (4) the  patient is status post hysterectomy and bilateral salpingo-oophorectomy  (5) atrial fibrillation, on Rivaroxaban  PLAN: Estill Bamberg and I discussed options to control her hot flashes. First, she wishes to take herself off of the venlafaxine. She doesn't find it to be effective. I suggested she wean off of this drug slowly, first moving to just 1 tab BID, then 1 tab qAM, then 1 tab every other morning, taking at least 1 month to do this. She is not interested in trying gabapentin again, as it made her too drowsy. Our final option is trying the clonidine patch. She understands this is technically a blood pressure medicine that works in the central nervous system. It can also be used effectively for minimizing hot flashes. Her blood pressure is generally in the 130s/70s currently, so I am hopeful it will not drop too low no the smallest dose available, 0.'1mg'$ /24hrs. She understands this patch will need to be changed every 7 days.   Hansini will return in August for follow up with Dr. Jana Hakim. She understands and agrees with this plan. She knows the goal of treatment in her case is cure. She has been encouraged to call with any issues that might arise before her next visit here.   Laurie Panda, NP   01/19/2016 2:53 PM

## 2016-01-27 ENCOUNTER — Encounter: Payer: Self-pay | Admitting: Physical Therapy

## 2016-01-27 ENCOUNTER — Ambulatory Visit: Payer: PPO | Attending: Family Medicine | Admitting: Physical Therapy

## 2016-01-27 DIAGNOSIS — M25512 Pain in left shoulder: Secondary | ICD-10-CM | POA: Diagnosis not present

## 2016-01-27 DIAGNOSIS — M25612 Stiffness of left shoulder, not elsewhere classified: Secondary | ICD-10-CM | POA: Diagnosis not present

## 2016-01-27 DIAGNOSIS — M75102 Unspecified rotator cuff tear or rupture of left shoulder, not specified as traumatic: Secondary | ICD-10-CM

## 2016-01-27 NOTE — Patient Instructions (Signed)
Elevation / Depression   While slowly inhaling, bring shoulders toward ears. Hold _2__ seconds. Slowly exhale while relaxing shoulders backward and downward. Repeat _10__ times. Do _2__ times per day.  SCAPULA: Retraction   Pinch shoulder blades together. Do not shrug shoulders. Hold _3__ seconds.    Do 10__ reps per set,  _2__ sets per day, _2__ sessions per day.    Strengthening: Resisted Extension   Hold tubing in both hands arm forward. Pull arms back,keeping elbows straight. Repeat _10___ times per set. Do __2__ sets per session. Do _2Scapular Retraction: Bilateral   Facing anchor, pull arms back, bringing shoulder blades together. Elbows bent 90 degrees.  Repeat __10__ times per set. Do __2__ sets per session. Do __2__ sessions per day.   Shoulder Rotation: External    In shoulder width stance, place band around wrists, thumbs up and elbows bent to 90. Rotate arms outward, upper arm against side keeping elbows bent. Repeat _10_ times per set.  Do _2_ sets per session. Do _2_ sessions per day.   

## 2016-01-27 NOTE — Therapy (Signed)
[place], [place], [address] Phone: [phone]   Fax:  [phone]  Physical Therapy Evaluation  Patient Details  Name: Katherine Powell MRN: [id number] Date of Birth: 07-08-1932 Referring Provider: W. Addison Lank  Encounter Date: 01/27/2016      PT End of Session - 01/27/16 1030    Visit Number 1   Date for PT Re-Evaluation 03/26/16   PT Start Time 1003   PT Stop Time 1100   PT Time Calculation (min) 57 min   Activity Tolerance Patient limited by pain   Behavior During Therapy [facility] for tasks assessed/performed      Past Medical History  Diagnosis Date  . Arrhythmia     atrial fibrillation/  on   . Hypertension   . Rosacea   . Atrial fibrillation ([place])   . Postmenopausal   . Sleep disturbance   . Thyroid disease   . Urinary incontinence   . Contact lens/glasses fitting     contact rt eye  . Seasonal allergies   . Arthritis   . Anxiety   . Breast cancer ([place]) 11/09/13    right upper outer  . Hx of radiation therapy 12/15/13- 01/11/14    right breast 4250 cGy in 17 sessions, (hypo-fractionated), no boost    Past Surgical History  Procedure Laterality Date  . Cystocele repair  2008  . Rectocele repair  2008  . Tonsillectomy    . Cataract extraction  2013    both  . Appendectomy  1954  . Eye surgery      cataracts-plugs for eyes  . Back surgery      lumb-2005  . Breast lumpectomy with needle localization Right 11/09/2013    Procedure: BREAST LUMPECTOMY WITH NEEDLE LOCALIZATION;  Surgeon: Harl Bowie, MD;  Location: [facility];  Service: General;  Laterality: Right;  . Abdominal hysterectomy  1973    endometriosis  . Bilateral oophorectomy      benign tumor on ovary    There were no vitals filed for this visit.  Visit Diagnosis:  Left shoulder pain - Plan: PT plan of care cert/re-cert  Stiffness of left shoulder joint - Plan: PT plan of care  cert/re-cert  Rotator cuff syndrome of left shoulder - Plan: PT plan of care cert/re-cert      Subjective Assessment - 01/27/16 1006    Subjective Patient reports that she started having left shoulder pain about 3 weeks ago.  She reports that she was doing yoga twice a week and started noticing the ROM was less   Limitations Lifting   Patient Stated Goals do hair and dress without difficulty   Currently in Pain? Yes   Pain Score 4    Pain Location Shoulder   Pain Orientation Left   Pain Descriptors / Indicators Aching;Sore   Pain Type Chronic pain   Pain Onset More than a month ago   Pain Frequency Constant   Aggravating Factors  reaching out and up 8/10   Pain Relieving Factors rest minimal pain   Effect of Pain on Daily Activities difficulty with hair and dressing            [facility] PT Assessment - 01/27/16 0001    Assessment   Medical Diagnosis left shoulder RC syndrome   Referring Provider Amedeo Gory   Onset Date/Surgical Date 01/17/16   Hand Dominance Right   Precautions   Precautions None   Balance Screen  Has the patient fallen in the past 6 months No   Has the patient had a decrease in activity level because of a fear of falling?  No   Is the patient reluctant to leave their home because of a fear of falling?  No   Home Environment   Additional Comments lives alone, does her own housework   Prior Function   Level of Independence Independent   Leisure yoga twice a week   AROM   Right Shoulder Flexion --   Right Shoulder ABduction --   Right Shoulder Internal Rotation --   Right Shoulder External Rotation --   Left Shoulder Flexion 130 Degrees   Left Shoulder ABduction 110 Degrees   Left Shoulder Internal Rotation 35 Degrees   Left Shoulder External Rotation 40 Degrees   Strength   Overall Strength Comments 4-/5 with pain in the posterior shoulder   Palpation   Palpation comment very tender in the left posterior shoulder   Special Tests    Special Tests  --  left impingement +, pain with empty can                   [facility] Adult PT Treatment/Exercise - 02/02/2016 0001    Modalities   Modalities Cryotherapy;Electrical Stimulation   Cryotherapy   Number Minutes Cryotherapy 15 Minutes   Cryotherapy Location Shoulder   Type of Cryotherapy Ice pack   Electrical Stimulation   Electrical Stimulation Location 15   Electrical Stimulation Action IFC   Electrical Stimulation Parameters sitting   Electrical Stimulation Goals Pain                  PT Short Term Goals - February 02, 2016 1034    PT SHORT TERM GOAL #1   Title independent with initial HEP   Time 2   Period Weeks   Status New           PT Long Term Goals - 02-02-2016 1034    PT LONG TERM GOAL #1   Title decrease pain 50%   Time 8   Period Weeks   Status New   PT LONG TERM GOAL #2   Title increase left shoulder AROM to 150 degrees flexion   Time 8   Period Weeks   Status New   PT LONG TERM GOAL #3   Title increase left shoulder AROM of IR to 60 degrees   Time 8   Period Weeks   Status New   PT LONG TERM GOAL #4   Title do hair and get dressed without difficulty   Time 8   Period Weeks   Status New               Plan - 2016-02-02 1031    Clinical Impression Statement Patient with left shoulder pain, tender posterior shoulder, dx of rc syndrome.  Has limited ROM, and very painful with motions.   Pt will benefit from skilled therapeutic intervention in order to improve on the following deficits Decreased range of motion;Decreased strength;Increased muscle spasms;Impaired flexibility;Postural dysfunction;Pain;Improper body mechanics;Impaired UE functional use   Rehab Potential Good   PT Frequency 2x / week   PT Duration 8 weeks   PT Treatment/Interventions ADLs/Self Care Home Management;Electrical Stimulation;Cryotherapy;Iontophoresis 4mg /ml Dexamethasone;Moist Heat;Therapeutic exercise;Therapeutic activities;Ultrasound;Manual techniques;Taping   PT  Next Visit Plan add iontophoresis   Consulted and Agree with Plan of Care Patient          G-Codes - 02/02/2016 1038    Functional Assessment Tool  Used foto 65%    Functional Limitation Other PT primary   Other PT Primary Current Status [id number]) At least 60 percent but less than 80 percent impaired, limited or restricted   Other PT Primary Goal Status [id number]) At least 40 percent but less than 60 percent impaired, limited or restricted       Problem List Patient Active Problem List   Diagnosis Date Noted  . Chronic anticoagulation 12/29/2014  . Encounter for monitoring flecainide therapy 12/29/2014  . Paroxysmal atrial fibrillation ([place]) 12/29/2014  . Postmenopausal estrogen deficiency 01/27/2014  . Hot flashes, menopausal 01/27/2014  . Breast cancer of upper-outer quadrant of right female breast ([place]) 11/17/2013    [name], PT 01/27/2016, 10:51 AM  [place], [place], [address] Phone: [phone]   Fax:  [phone]  Name: Katherine Powell MRN: [id number] Date of Birth: 01-24-1932

## 2016-01-31 DIAGNOSIS — I10 Essential (primary) hypertension: Secondary | ICD-10-CM | POA: Diagnosis not present

## 2016-01-31 DIAGNOSIS — R5383 Other fatigue: Secondary | ICD-10-CM | POA: Diagnosis not present

## 2016-01-31 DIAGNOSIS — R42 Dizziness and giddiness: Secondary | ICD-10-CM | POA: Diagnosis not present

## 2016-01-31 DIAGNOSIS — C50911 Malignant neoplasm of unspecified site of right female breast: Secondary | ICD-10-CM | POA: Diagnosis not present

## 2016-01-31 DIAGNOSIS — R682 Dry mouth, unspecified: Secondary | ICD-10-CM | POA: Diagnosis not present

## 2016-01-31 DIAGNOSIS — N183 Chronic kidney disease, stage 3 (moderate): Secondary | ICD-10-CM | POA: Diagnosis not present

## 2016-01-31 DIAGNOSIS — I480 Paroxysmal atrial fibrillation: Secondary | ICD-10-CM | POA: Diagnosis not present

## 2016-01-31 DIAGNOSIS — Z7901 Long term (current) use of anticoagulants: Secondary | ICD-10-CM | POA: Diagnosis not present

## 2016-01-31 DIAGNOSIS — M8580 Other specified disorders of bone density and structure, unspecified site: Secondary | ICD-10-CM | POA: Diagnosis not present

## 2016-02-01 ENCOUNTER — Ambulatory Visit: Payer: PPO | Attending: Family Medicine | Admitting: Physical Therapy

## 2016-02-01 ENCOUNTER — Encounter: Payer: Self-pay | Admitting: Physical Therapy

## 2016-02-01 DIAGNOSIS — M75102 Unspecified rotator cuff tear or rupture of left shoulder, not specified as traumatic: Secondary | ICD-10-CM | POA: Diagnosis not present

## 2016-02-01 DIAGNOSIS — M25612 Stiffness of left shoulder, not elsewhere classified: Secondary | ICD-10-CM | POA: Diagnosis not present

## 2016-02-01 DIAGNOSIS — M25512 Pain in left shoulder: Secondary | ICD-10-CM

## 2016-02-01 NOTE — Therapy (Signed)
[place], [place], [address] Phone: [phone]   Fax:  [phone]  Physical Therapy Treatment  Patient Details  Name: Katherine Powell MRN: [id number] Date of Birth: June 27, 1932 Referring Provider: W. Addison Lank  Encounter Date: 02/01/2016      PT End of Session - 02/01/16 0959    Visit Number 2   Date for PT Re-Evaluation 03/26/16   PT Start Time 0931   PT Stop Time 1025   PT Time Calculation (min) 54 min   Activity Tolerance Patient tolerated treatment well   Behavior During Therapy [facility] for tasks assessed/performed      Past Medical History  Diagnosis Date  . Arrhythmia     atrial fibrillation/  on   . Hypertension   . Rosacea   . Atrial fibrillation ([place])   . Postmenopausal   . Sleep disturbance   . Thyroid disease   . Urinary incontinence   . Contact lens/glasses fitting     contact rt eye  . Seasonal allergies   . Arthritis   . Anxiety   . Breast cancer ([place]) 11/09/13    right upper outer  . Hx of radiation therapy 12/15/13- 01/11/14    right breast 4250 cGy in 17 sessions, (hypo-fractionated), no boost    Past Surgical History  Procedure Laterality Date  . Cystocele repair  2008  . Rectocele repair  2008  . Tonsillectomy    . Cataract extraction  2013    both  . Appendectomy  1954  . Eye surgery      cataracts-plugs for eyes  . Back surgery      lumb-2005  . Breast lumpectomy with needle localization Right 11/09/2013    Procedure: BREAST LUMPECTOMY WITH NEEDLE LOCALIZATION;  Surgeon: Harl Bowie, MD;  Location: [place];  Service: General;  Laterality: Right;  . Abdominal hysterectomy  1973    endometriosis  . Bilateral oophorectomy      benign tumor on ovary    There were no vitals filed for this visit.  Visit Diagnosis:  Left shoulder pain  Stiffness of left shoulder joint  Rotator cuff syndrome of left shoulder      Subjective  Assessment - 02/01/16 0934    Subjective Reports had some pain yesterday at Yoga with left shoulder when trying to reach overhead.   Currently in Pain? Yes   Pain Score 3    Pain Location Shoulder   Pain Orientation Left   Pain Descriptors / Indicators Aching;Sore   Pain Type Chronic pain            [facility] PT Assessment - 02/01/16 0001    AROM   Left Shoulder Flexion 135 Degrees   Left Shoulder ABduction 115 Degrees                     [facility] Adult PT Treatment/Exercise - 02/01/16 0001    Exercises   Exercises Shoulder   Shoulder Exercises: Standing   Protraction Strengthening;20 reps;Theraband   Theraband Level (Shoulder Protraction) Level 2 (Red)   Horizontal ABduction Strengthening;20 reps;Theraband   Theraband Level (Shoulder Horizontal ABduction) Level 1 (Yellow)   External Rotation Strengthening;20 reps;Theraband   Theraband Level (Shoulder External Rotation) Level 2 (Red)   External Rotation Weight (lbs) 1# as well   Internal Rotation Strengthening;20 reps;Theraband   Theraband Level (Shoulder Internal Rotation) Level 2 (Red)   Extension Strengthening;20 reps;Theraband   Theraband Level (  Shoulder Extension) Level 3 (Green)   Shoulder Exercises: ROM/Strengthening   UBE (Upper Arm Bike) level 1 x 4 minutes   Cybex Row Limitations 15# 2x10   "W" Arms 20   Other ROM/Strengthening Exercises lat pulls 15# 2x10   Other ROM/Strengthening Exercises use of body blade all motions 20 seconds, then use of wand all motions, then wall slides and circles   Shoulder Exercises: Stretch   Corner Stretch 3 reps;10 seconds   Wall Stretch - Flexion 3 reps;10 seconds   Modalities   Modalities Iontophoresis   Iontophoresis   Type of Iontophoresis Dexamethasone   Location left shoulder   Dose 30mA   Time 4 hour patch                  PT Short Term Goals - 02/01/16 1001    PT SHORT TERM GOAL #1   Title independent with initial HEP   Status Achieved            PT Long Term Goals - 01/27/16 1034    PT LONG TERM GOAL #1   Title decrease pain 50%   Time 8   Period Weeks   Status New   PT LONG TERM GOAL #2   Title increase left shoulder AROM to 150 degrees flexion   Time 8   Period Weeks   Status New   PT LONG TERM GOAL #3   Title increase left shoulder AROM of IR to 60 degrees   Time 8   Period Weeks   Status New   PT LONG TERM GOAL #4   Title do hair and get dressed without difficulty   Time 8   Period Weeks   Status New               Plan - 02/01/16 1000    Clinical Impression Statement Patient seemed to tolerate exercies well, some crepitus and some pain with overhead exercises as well as with tband.  Demonstrated better ROM today.   PT Next Visit Plan see if exercises were okay   Consulted and Agree with Plan of Care Patient        Problem List Patient Active Problem List   Diagnosis Date Noted  . Chronic anticoagulation 12/29/2014  . Encounter for monitoring flecainide therapy 12/29/2014  . Paroxysmal atrial fibrillation ([place]) 12/29/2014  . Postmenopausal estrogen deficiency 01/27/2014  . Hot flashes, menopausal 01/27/2014  . Breast cancer of upper-outer quadrant of right female breast ([place]) 11/17/2013    [name], PT 02/01/2016, 10:02 AM  [place], [place], [address] Phone: [phone]   Fax:  [phone]  Name: Katherine Powell MRN: [id number] Date of Birth: 11-Dec-1931

## 2016-02-08 ENCOUNTER — Encounter: Payer: Self-pay | Admitting: Physical Therapy

## 2016-02-08 ENCOUNTER — Ambulatory Visit: Payer: PPO | Admitting: Physical Therapy

## 2016-02-08 DIAGNOSIS — M75102 Unspecified rotator cuff tear or rupture of left shoulder, not specified as traumatic: Secondary | ICD-10-CM

## 2016-02-08 DIAGNOSIS — M25512 Pain in left shoulder: Secondary | ICD-10-CM

## 2016-02-08 DIAGNOSIS — M25612 Stiffness of left shoulder, not elsewhere classified: Secondary | ICD-10-CM

## 2016-02-08 NOTE — Therapy (Signed)
Katherine Powell, [place], [address] Phone: [phone]   Fax:  [phone]  Physical Therapy Treatment  Patient Details  Name: Katherine Powell MRN: [id number] Date of Birth: [birth year] Referring Provider: W. Addison Lank  Encounter Date: 02/08/2016      PT End of Session - 02/08/16 0916    Visit Number 3   Date for PT Re-Evaluation 03/26/16   PT Start Time 0840   PT Stop Time 0925   PT Time Calculation (min) 45 min      Past Medical History  Diagnosis Date  . Arrhythmia     atrial fibrillation/  on   . Hypertension   . Rosacea   . Atrial fibrillation ([place])   . Postmenopausal   . Sleep disturbance   . Thyroid disease   . Urinary incontinence   . Contact lens/glasses fitting     contact rt eye  . Seasonal allergies   . Arthritis   . Anxiety   . Breast cancer ([place]) 11/09/13    right upper outer  . Hx of radiation therapy 12/15/13- 01/11/14    right breast 4250 cGy in 17 sessions, (hypo-fractionated), no boost    Past Surgical History  Procedure Laterality Date  . Cystocele repair  2008  . Rectocele repair  2008  . Tonsillectomy    . Cataract extraction  2013    both  . Appendectomy  1954  . Eye surgery      cataracts-plugs for eyes  . Back surgery      lumb-2005  . Breast lumpectomy with needle localization Right 11/09/2013    Procedure: BREAST LUMPECTOMY WITH NEEDLE LOCALIZATION;  Surgeon: Harl Bowie, MD;  Location: [facility];  Service: General;  Laterality: Right;  . Abdominal hysterectomy  1973    endometriosis  . Bilateral oophorectomy      benign tumor on ovary    There were no vitals filed for this visit.  Visit Diagnosis:  Left shoulder pain  Stiffness of left shoulder joint  Rotator cuff syndrome of left shoulder      Subjective Assessment - 02/08/16 0846    Subjective PAtient reports that she still has trouble with any abduction or reaching  overhead.  Reports that she is doing her hair and getting dressed a little easier.   Currently in Pain? Yes   Pain Score 1    Pain Location Shoulder   Pain Orientation Left   Pain Descriptors / Indicators Aching   Pain Type Chronic pain            [facility] PT Assessment - 02/08/16 0001    AROM   Left Shoulder Flexion 141 Degrees   Left Shoulder ABduction 126 Degrees   Left Shoulder Internal Rotation 47 Degrees   Left Shoulder External Rotation 50 Degrees                     [facility] Adult PT Treatment/Exercise - 02/08/16 0001    Shoulder Exercises: Standing   Protraction Strengthening;20 reps;Theraband   Theraband Level (Shoulder Protraction) Level 2 (Red)   Horizontal ABduction Strengthening;20 reps;Theraband   Theraband Level (Shoulder Horizontal ABduction) Level 1 (Yellow)   External Rotation Strengthening;20 reps;Theraband   Theraband Level (Shoulder External Rotation) Level 2 (Red)   External Rotation Weight (lbs) 1# as well   Internal Rotation Strengthening;20 reps;Theraband   Theraband Level (Shoulder Internal Rotation) Level 2 (Red)   Extension Strengthening;20 reps;Theraband  Theraband Level (Shoulder Extension) Level 3 (Green)   Shoulder Exercises: ROM/Strengthening   UBE (Upper Arm Bike) level 4 x 4 minutes   Cybex Row Limitations 15# 2x10   "W" Arms 20   Other ROM/Strengthening Exercises lat pulls 15# 2x10   Other ROM/Strengthening Exercises use of body blade all motions 20 seconds, then use of wand all motions, then wall slides and circles   Shoulder Exercises: Stretch   Corner Stretch 3 reps;10 seconds   Wall Stretch - Flexion 3 reps;10 seconds   Iontophoresis   Type of Iontophoresis Dexamethasone   Location left shoulder   Dose 62mA   Time 4 hour patch   Manual Therapy   Manual therapy comments PROM of the left shoulder all motions, gentle Grade II joint mobs                  PT Short Term Goals - 02/01/16 1001    PT SHORT TERM  GOAL #1   Title independent with initial HEP   Status Achieved           PT Long Term Goals - 02/08/16 0926    PT LONG TERM GOAL #1   Title decrease pain 50%   Status On-going   PT LONG TERM GOAL #2   Title increase left shoulder AROM to 150 degrees flexion   Status On-going   PT LONG TERM GOAL #3   Title increase left shoulder AROM of IR to 60 degrees   Status On-going   PT LONG TERM GOAL #4   Title do hair and get dressed without difficulty   Status On-going               Plan - 02/08/16 [id number]    Clinical Impression Statement Patient with a great increase in AROM of the left shoulder.  Still painful with abduction and overhead reaching.  She can do her bra in the back now.  Reports that she thinks the ionto patch helps   PT Next Visit Plan continue with current plan   Consulted and Agree with Plan of Care Patient        Problem List Patient Active Problem List   Diagnosis Date Noted  . Chronic anticoagulation 12/29/2014  . Encounter for monitoring flecainide therapy 12/29/2014  . Paroxysmal atrial fibrillation ([place]) 12/29/2014  . Postmenopausal estrogen deficiency 01/27/2014  . Hot flashes, menopausal 01/27/2014  . Breast cancer of upper-outer quadrant of right female breast ([place]) 11/17/2013    Katherine Boast., PT 02/08/2016, 9:27 AM  [place], [place], [address] Phone: [phone]   Fax:  [phone]  Name: [name] MRN: [id number] Date of Birth: 06-Feb-1932

## 2016-02-09 ENCOUNTER — Telehealth: Payer: Self-pay | Admitting: *Deleted

## 2016-02-09 NOTE — Telephone Encounter (Signed)
"  I missed a call from the Nurse practitioner last night."  Call transferred to ext.: [phone].

## 2016-02-15 ENCOUNTER — Ambulatory Visit: Payer: PPO | Admitting: Physical Therapy

## 2016-02-15 ENCOUNTER — Encounter: Payer: Self-pay | Admitting: Physical Therapy

## 2016-02-15 DIAGNOSIS — M25512 Pain in left shoulder: Secondary | ICD-10-CM

## 2016-02-15 DIAGNOSIS — M25612 Stiffness of left shoulder, not elsewhere classified: Secondary | ICD-10-CM

## 2016-02-15 DIAGNOSIS — M75102 Unspecified rotator cuff tear or rupture of left shoulder, not specified as traumatic: Secondary | ICD-10-CM

## 2016-02-15 NOTE — Therapy (Signed)
[place], [place], [address] Phone: [phone]   Fax:  [phone]  Physical Therapy Treatment  Patient Details  Name: Katherine Powell MRN: [id number] Date of Birth: 1932/05/29 Referring Provider: W. Addison Lank  Encounter Date: 02/15/2016      PT End of Session - 02/15/16 0930    Visit Number 4   Date for PT Re-Evaluation 03/26/16   PT Start Time 0850   PT Stop Time 0936   PT Time Calculation (min) 46 min   Activity Tolerance Patient tolerated treatment well   Behavior During Therapy [facility] for tasks assessed/performed      Past Medical History  Diagnosis Date  . Arrhythmia     atrial fibrillation/  on   . Hypertension   . Rosacea   . Atrial fibrillation ([place])   . Postmenopausal   . Sleep disturbance   . Thyroid disease   . Urinary incontinence   . Contact lens/glasses fitting     contact rt eye  . Seasonal allergies   . Arthritis   . Anxiety   . Breast cancer ([place]) 11/09/13    right upper outer  . Hx of radiation therapy 12/15/13- 01/11/14    right breast 4250 cGy in 17 sessions, (hypo-fractionated), no boost    Past Surgical History  Procedure Laterality Date  . Cystocele repair  2008  . Rectocele repair  2008  . Tonsillectomy    . Cataract extraction  2013    both  . Appendectomy  1954  . Eye surgery      cataracts-plugs for eyes  . Back surgery      lumb-2005  . Breast lumpectomy with needle localization Right 11/09/2013    Procedure: BREAST LUMPECTOMY WITH NEEDLE LOCALIZATION;  Surgeon: Harl Bowie, MD;  Location: [facility];  Service: General;  Laterality: Right;  . Abdominal hysterectomy  1973    endometriosis  . Bilateral oophorectomy      benign tumor on ovary    There were no vitals filed for this visit.  Visit Diagnosis:  Left shoulder pain  Stiffness of left shoulder joint  Rotator cuff syndrome of left shoulder      Subjective  Assessment - 02/15/16 0908    Subjective I over did it at Yoga yesterday.  Hurting some and hurst when I sleep   Currently in Pain? Yes   Pain Score 3    Pain Location Shoulder   Pain Orientation Left;Lateral   Pain Descriptors / Indicators Aching   Aggravating Factors  reaching, fast motions   Pain Relieving Factors rest                         [facility] Adult PT Treatment/Exercise - 02/15/16 0001    Shoulder Exercises: Standing   Protraction Strengthening;20 reps;Theraband   Theraband Level (Shoulder Protraction) Level 2 (Red)   Horizontal ABduction Strengthening;20 reps;Theraband   Theraband Level (Shoulder Horizontal ABduction) Level 1 (Yellow)   External Rotation Strengthening;20 reps;Theraband   Theraband Level (Shoulder External Rotation) Level 2 (Red)   External Rotation Weight (lbs) 1# as well   Internal Rotation Strengthening;20 reps;Theraband   Theraband Level (Shoulder Internal Rotation) Level 2 (Red)   Extension Strengthening;20 reps;Theraband   Theraband Level (Shoulder Extension) Level 3 (Green)   Shoulder Exercises: ROM/Strengthening   UBE (Upper Arm Bike) level 4 x 4 minutes   "W" Arms 20   Shoulder Exercises:  IT sales professional 3 reps;10 seconds   Wall Stretch - Flexion 3 reps;10 seconds   Iontophoresis   Type of Iontophoresis Dexamethasone   Location left shoulder   Dose 56mA   Time 4 hour patch   Manual Therapy   Manual therapy comments PROM of the left shoulder all motions, gentle Grade II joint mobs, STM of the left deltoid and posterior shoulder, used kinesio tape "I" strip along the trapezius                  PT Short Term Goals - 02/01/16 1001    PT SHORT TERM GOAL #1   Title independent with initial HEP   Status Achieved           PT Long Term Goals - 02/15/16 0933    PT LONG TERM GOAL #1   Title decrease pain 50%   Status On-going   PT LONG TERM GOAL #2   Title increase left shoulder AROM to 150 degrees  flexion   Status On-going               Plan - 02/15/16 0932    Clinical Impression Statement Continues to have pain  with abduction.  ROM is improved, she is in more pain today but reports that she "did too much in yoga yesterday"   PT Next Visit Plan may try a few different ways to tape   Consulted and Agree with Plan of Care Patient        Problem List Patient Active Problem List   Diagnosis Date Noted  . Chronic anticoagulation 12/29/2014  . Encounter for monitoring flecainide therapy 12/29/2014  . Paroxysmal atrial fibrillation ([employer]) 12/29/2014  . Postmenopausal estrogen deficiency 01/27/2014  . Hot flashes, menopausal 01/27/2014  . Breast cancer of upper-outer quadrant of right female breast ([place]) 11/17/2013    [name], PT 02/15/2016, 9:34 AM  [place], [place], [address] Phone: [phone]   Fax:  [phone]  Name: Katherine Powell MRN: [id number] Date of Birth: [birth year]

## 2016-02-22 ENCOUNTER — Telehealth: Payer: Self-pay | Admitting: *Deleted

## 2016-02-22 ENCOUNTER — Telehealth: Payer: Self-pay

## 2016-02-22 NOTE — Telephone Encounter (Signed)
Patient called stating that she is having "hot flashes" again- the clonidine patch is no longer working and she is actually "soaking through her shirt".  Patient would like to know what other options she has.  Writer spoke with [name], LPN because [name], NP has been managing her meds with the hot flashes.   [name], LPN to call patient.

## 2016-02-22 NOTE — Telephone Encounter (Signed)
Called pt to discuss her hotflashes. Pt told me since Dr. Jacelyn Grip has taken her off the Effexor her hotflashes have started up again. She did tell me the doctor weaned her off of them so she didn't stop them cold [place]. Pt is leaning towards going back on them but she wants to talk to NP and her PCP, Dr. Addison Lank first. She will see PCP in a month. I told her I will have Heather to call her tomorrow. Message to be fwd to [employer].

## 2016-02-23 ENCOUNTER — Encounter: Payer: Self-pay | Admitting: Physical Therapy

## 2016-02-23 ENCOUNTER — Ambulatory Visit: Payer: PPO | Admitting: Physical Therapy

## 2016-02-23 DIAGNOSIS — M25612 Stiffness of left shoulder, not elsewhere classified: Secondary | ICD-10-CM

## 2016-02-23 DIAGNOSIS — M25512 Pain in left shoulder: Secondary | ICD-10-CM

## 2016-02-23 DIAGNOSIS — M75102 Unspecified rotator cuff tear or rupture of left shoulder, not specified as traumatic: Secondary | ICD-10-CM

## 2016-02-23 NOTE — Therapy (Signed)
[place], [place], [address] Phone: [phone]   Fax:  [phone]  Physical Therapy Treatment  Patient Details  Name: Katherine Powell MRN: [id number] Date of Birth: [birth year] Referring Provider: W. Addison Lank  Encounter Date: 02/23/2016      PT End of Session - 02/23/16 1143    Visit Number 5   Date for PT Re-Evaluation 03/26/16   PT Start Time 1100   PT Stop Time 1144   PT Time Calculation (min) 44 min   Activity Tolerance Patient tolerated treatment well   Behavior During Therapy [facility] for tasks assessed/performed      Past Medical History  Diagnosis Date  . Arrhythmia     atrial fibrillation/  on   . Hypertension   . Rosacea   . Atrial fibrillation ([place])   . Postmenopausal   . Sleep disturbance   . Thyroid disease   . Urinary incontinence   . Contact lens/glasses fitting     contact rt eye  . Seasonal allergies   . Arthritis   . Anxiety   . Breast cancer ([place]) 11/09/13    right upper outer  . Hx of radiation therapy 12/15/13- 01/11/14    right breast 4250 cGy in 17 sessions, (hypo-fractionated), no boost    Past Surgical History  Procedure Laterality Date  . Cystocele repair  2008  . Rectocele repair  2008  . Tonsillectomy    . Cataract extraction  2013    both  . Appendectomy  1954  . Eye surgery      cataracts-plugs for eyes  . Back surgery      lumb-2005  . Breast lumpectomy with needle localization Right 11/09/2013    Procedure: BREAST LUMPECTOMY WITH NEEDLE LOCALIZATION;  Surgeon: Harl Bowie, MD;  Location: [facility];  Service: General;  Laterality: Right;  . Abdominal hysterectomy  1973    endometriosis  . Bilateral oophorectomy      benign tumor on ovary    There were no vitals filed for this visit.  Visit Diagnosis:  Left shoulder pain  Stiffness of left shoulder joint  Rotator cuff syndrome of left shoulder      Subjective  Assessment - 02/23/16 1106    Subjective Patient reports that she was helping a friend move a table and felt pain in the left lateral shoulder.     Currently in Pain? Yes   Pain Score 3    Pain Location Shoulder   Pain Orientation Left;Lateral            [facility] PT Assessment - 02/23/16 0001    AROM   Left Shoulder Flexion 150 Degrees   Left Shoulder ABduction 141 Degrees   Left Shoulder Internal Rotation 55 Degrees   Left Shoulder External Rotation 75 Degrees   Strength   Overall Strength Comments 4/5 with pain for abduction                     [facility] Adult PT Treatment/Exercise - 02/23/16 0001    Shoulder Exercises: Standing   Protraction Strengthening;20 reps;Theraband   Theraband Level (Shoulder Protraction) Level 3 (Green)   Horizontal ABduction Strengthening;20 reps;Theraband   Theraband Level (Shoulder Horizontal ABduction) Level 1 (Yellow)   External Rotation Strengthening;20 reps;Theraband   Theraband Level (Shoulder External Rotation) Level 2 (Red)   Internal Rotation Strengthening;20 reps;Theraband   Theraband Level (Shoulder Internal Rotation) Level 2 (Red)   Extension  Strengthening;20 reps;Theraband   Theraband Level (Shoulder Extension) Level 3 (Green)   Shoulder Exercises: ROM/Strengthening   UBE (Upper Arm Bike) level 4 x 4 minutes   Cybex Row Limitations 15# 2x10   "W" Arms 20   X to V Arms 10   Other ROM/Strengthening Exercises lat pulls 15# 2x10   Other ROM/Strengthening Exercises isometrics of left shoulder all motions, ball rolling and ball vs wall   Shoulder Exercises: Stretch   Wall Stretch - Flexion 3 reps;10 seconds   Modalities   Modalities Ultrasound   Ultrasound   Ultrasound Location left lateral shoulder   Ultrasound Parameters 1MHz 100% 1.0 w/cm   Ultrasound Goals Pain   Manual Therapy   Manual therapy comments PROM of the left shoulder all motions, gentle Grade II joint mobs, STM of the left deltoid and posterior shoulder,  used kinesio tape "I" strip along the trapezius                  PT Short Term Goals - 02/01/16 1001    PT SHORT TERM GOAL #1   Title independent with initial HEP   Status Achieved           PT Long Term Goals - 02/23/16 1145    PT LONG TERM GOAL #1   Title decrease pain 50%   Status On-going   PT LONG TERM GOAL #2   Title increase left shoulder AROM to 150 degrees flexion   Status On-going   PT LONG TERM GOAL #3   Title increase left shoulder AROM of IR to 60 degrees   Status On-going   PT LONG TERM GOAL #4   Title do hair and get dressed without difficulty   Status Achieved               Plan - 02/23/16 1143    Clinical Impression Statement Patient has made great improvements in ROM.  she however lifted a table over a couch and reports that she felt pain in the shoulder again that lasted quite some time.   PT Next Visit Plan could try tape again    Consulted and Agree with Plan of Care Patient        Problem List Patient Active Problem List   Diagnosis Date Noted  . Chronic anticoagulation 12/29/2014  . Encounter for monitoring flecainide therapy 12/29/2014  . Paroxysmal atrial fibrillation ([place]) 12/29/2014  . Postmenopausal estrogen deficiency 01/27/2014  . Hot flashes, menopausal 01/27/2014  . Breast cancer of upper-outer quadrant of right female breast ([place]) 11/17/2013    [name], PT 02/23/2016, 11:47 AM  [place], [place], [address] Phone: [phone]   Fax:  [phone]  Name: Katherine Powell MRN: [id number] Date of Birth: Aug 02, 1932

## 2016-03-01 ENCOUNTER — Ambulatory Visit: Payer: PPO | Admitting: Physical Therapy

## 2016-03-01 ENCOUNTER — Encounter: Payer: Self-pay | Admitting: Physical Therapy

## 2016-03-01 DIAGNOSIS — M25612 Stiffness of left shoulder, not elsewhere classified: Secondary | ICD-10-CM

## 2016-03-01 DIAGNOSIS — M25512 Pain in left shoulder: Secondary | ICD-10-CM | POA: Diagnosis not present

## 2016-03-01 DIAGNOSIS — M75102 Unspecified rotator cuff tear or rupture of left shoulder, not specified as traumatic: Secondary | ICD-10-CM

## 2016-03-01 NOTE — Therapy (Signed)
[facility] [place] [address] [place], [place], [address] Phone: [phone]   Fax:  [phone]  Physical Therapy Treatment  Patient Details  Name: Katherine Powell MRN: [id number] Date of Birth: 1932/09/11 Referring Provider: W. Corliss Blacker  Encounter Date: 03/01/2016      PT End of Session - 03/01/16 1605    Visit Number 6   Date for PT Re-Evaluation 03/26/16   PT Start Time 1520   PT Stop Time 1610   PT Time Calculation (min) 50 min   Activity Tolerance Patient tolerated treatment well   Behavior During Therapy [facility] for tasks assessed/performed      Past Medical History  Diagnosis Date  . Arrhythmia     atrial fibrillation/  on   . Hypertension   . Rosacea   . Atrial fibrillation ([facility])   . Postmenopausal   . Sleep disturbance   . Thyroid disease   . Urinary incontinence   . Contact lens/glasses fitting     contact rt eye  . Seasonal allergies   . Arthritis   . Anxiety   . Breast cancer ([facility]) 11/09/13    right upper outer  . Hx of radiation therapy 12/15/13- 01/11/14    right breast 4250 cGy in 17 sessions, (hypo-fractionated), no boost    Past Surgical History  Procedure Laterality Date  . Cystocele repair  2008  . Rectocele repair  2008  . Tonsillectomy    . Cataract extraction  2013    both  . Appendectomy  1954  . Eye surgery      cataracts-plugs for eyes  . Back surgery      lumb-2005  . Breast lumpectomy with needle localization Right 11/09/2013    Procedure: BREAST LUMPECTOMY WITH NEEDLE LOCALIZATION;  Surgeon: [name], MD;  Location: [facility];  Service: General;  Laterality: Right;  . Abdominal hysterectomy  1973    endometriosis  . Bilateral oophorectomy      benign tumor on ovary    There were no vitals filed for this visit.  Visit Diagnosis:  Left shoulder pain  Stiffness of left shoulder joint  Rotator cuff syndrome of left shoulder      Subjective  Assessment - 03/01/16 1524    Subjective She reports she went to yoga today and had to stop somethings due to pain.  She reports that the pain from lifting the table resolved.  Reports increased ability to dress and do hair   Currently in Pain? Yes   Pain Score 1    Pain Location Shoulder   Pain Orientation Left;Lateral   Pain Descriptors / Indicators Aching   Pain Type Chronic pain   Aggravating Factors  reaching up    Pain Relieving Factors rest            [facility] PT Assessment - 03/01/16 0001    AROM   Left Shoulder Flexion 155 Degrees   Left Shoulder ABduction 150 Degrees   Left Shoulder Internal Rotation 65 Degrees   Left Shoulder External Rotation 80 Degrees                     [facility] Adult PT Treatment/Exercise - 03/01/16 0001    Shoulder Exercises: ROM/Strengthening   UBE (Upper Arm Bike) level 4 x 4 minutes   Cybex Row Limitations 15# 2x10   "W" Arms 20   X to V Arms 10   Other ROM/Strengthening Exercises lat pulls  15# 2x10   Other ROM/Strengthening Exercises isometrics of left shoulder all motions, ball rolling and ball vs wall   Shoulder Exercises: Stretch   Wall Stretch - Flexion 3 reps;10 seconds   Ultrasound   Ultrasound Location left lateral shoulder   Ultrasound Parameters 1MHz   Ultrasound Goals Pain   Iontophoresis   Type of Iontophoresis Dexamethasone   Location left shoulder   Dose 78m   Time 4 hour patch   Manual Therapy   Manual therapy comments PROM of the left shoulder all motions, gentle Grade II joint mobs, STM of the left deltoid and posterior shoulder, used kinesio tape "I" strip along the trapezius                  PT Short Term Goals - 02/01/16 1001    PT SHORT TERM GOAL #1   Title independent with initial HEP   Status Achieved           PT Long Term Goals - [id number]    PT LONG TERM GOAL #1   Title decrease pain 50%   Status Partially Met   PT LONG TERM GOAL #2   Title increase left shoulder AROM to  150 degrees flexion   Status Partially Met   PT LONG TERM GOAL #3   Title increase left shoulder AROM of IR to 60 degrees   Status Achieved   PT LONG TERM GOAL #4   Title do hair and get dressed without difficulty   Status Achieved               Plan - [id number]    Clinical Impression Statement Patient with very good ROM, she feels good about dressing and doing her hair with less pain.     PT Next Visit Plan patient is pleased, will be put on hold for 2 weeks/   Consulted and Agree with Plan of Care Patient          G-Codes - 004/02/[id number]    Functional Assessment Tool Used foto 39% limitation   Functional Limitation Other PT primary   Other PT Primary Current Status (([id number] At least 20 percent but less than 40 percent impaired, limited or restricted   Other PT Primary Goal Status (([id number] At least 40 percent but less than 60 percent impaired, limited or restricted      Problem List Patient Active Problem List   Diagnosis Date Noted  . Chronic anticoagulation 12/29/2014  . Encounter for monitoring flecainide therapy 12/29/2014  . Paroxysmal atrial fibrillation ([place] 12/29/2014  . Postmenopausal estrogen deficiency 01/27/2014  . Hot flashes, menopausal 01/27/2014  . Breast cancer of upper-outer quadrant of right female breast ([place] 11/17/2013    ASumner Boast, PT 3Apr 02, 2017 4:11 PM  [place] [address]Phone: [phone]  Fax:  [phone] Name: Katherine REACHMRN: [id number] of Birth: 904/11/1932

## 2016-03-07 ENCOUNTER — Telehealth: Payer: Self-pay | Admitting: *Deleted

## 2016-03-07 DIAGNOSIS — N951 Menopausal and female climacteric states: Secondary | ICD-10-CM

## 2016-03-07 MED ORDER — CLONIDINE HCL 0.1 MG/24HR TD PTWK: 0.1000 mg | MEDICATED_PATCH | TRANSDERMAL | Status: DC

## 2016-03-07 NOTE — Telephone Encounter (Signed)
VM message received from patient @ 9:09 am, requesting 3 month refill on her Clonidine patches @ [facility].  Refill done.

## 2016-03-10 DIAGNOSIS — S61012A Laceration without foreign body of left thumb without damage to nail, initial encounter: Secondary | ICD-10-CM | POA: Diagnosis not present

## 2016-03-20 ENCOUNTER — Other Ambulatory Visit: Payer: Self-pay | Admitting: Nurse Practitioner

## 2016-03-20 ENCOUNTER — Other Ambulatory Visit: Payer: Self-pay | Admitting: Interventional Cardiology

## 2016-03-21 NOTE — Telephone Encounter (Signed)
Rx request sent to pharmacy.  

## 2016-03-29 DIAGNOSIS — M85851 Other specified disorders of bone density and structure, right thigh: Secondary | ICD-10-CM | POA: Diagnosis not present

## 2016-03-29 DIAGNOSIS — M85852 Other specified disorders of bone density and structure, left thigh: Secondary | ICD-10-CM | POA: Diagnosis not present

## 2016-04-09 DIAGNOSIS — N183 Chronic kidney disease, stage 3 (moderate): Secondary | ICD-10-CM | POA: Diagnosis not present

## 2016-04-09 DIAGNOSIS — Z7901 Long term (current) use of anticoagulants: Secondary | ICD-10-CM | POA: Diagnosis not present

## 2016-04-09 DIAGNOSIS — M859 Disorder of bone density and structure, unspecified: Secondary | ICD-10-CM | POA: Diagnosis not present

## 2016-04-09 DIAGNOSIS — Z1389 Encounter for screening for other disorder: Secondary | ICD-10-CM | POA: Diagnosis not present

## 2016-04-09 DIAGNOSIS — M8580 Other specified disorders of bone density and structure, unspecified site: Secondary | ICD-10-CM | POA: Diagnosis not present

## 2016-04-09 DIAGNOSIS — R682 Dry mouth, unspecified: Secondary | ICD-10-CM | POA: Diagnosis not present

## 2016-04-09 DIAGNOSIS — Z79899 Other long term (current) drug therapy: Secondary | ICD-10-CM | POA: Diagnosis not present

## 2016-04-09 DIAGNOSIS — I10 Essential (primary) hypertension: Secondary | ICD-10-CM | POA: Diagnosis not present

## 2016-04-09 DIAGNOSIS — R5383 Other fatigue: Secondary | ICD-10-CM | POA: Diagnosis not present

## 2016-04-09 DIAGNOSIS — Z23 Encounter for immunization: Secondary | ICD-10-CM | POA: Diagnosis not present

## 2016-04-09 DIAGNOSIS — I480 Paroxysmal atrial fibrillation: Secondary | ICD-10-CM | POA: Diagnosis not present

## 2016-04-09 DIAGNOSIS — Z0000 Encounter for general adult medical examination without abnormal findings: Secondary | ICD-10-CM | POA: Diagnosis not present

## 2016-04-10 DIAGNOSIS — H0014 Chalazion left upper eyelid: Secondary | ICD-10-CM | POA: Diagnosis not present

## 2016-04-10 DIAGNOSIS — H43813 Vitreous degeneration, bilateral: Secondary | ICD-10-CM | POA: Diagnosis not present

## 2016-04-10 DIAGNOSIS — H353131 Nonexudative age-related macular degeneration, bilateral, early dry stage: Secondary | ICD-10-CM | POA: Diagnosis not present

## 2016-04-10 DIAGNOSIS — H26493 Other secondary cataract, bilateral: Secondary | ICD-10-CM | POA: Diagnosis not present

## 2016-04-10 DIAGNOSIS — H04123 Dry eye syndrome of bilateral lacrimal glands: Secondary | ICD-10-CM | POA: Diagnosis not present

## 2016-04-10 DIAGNOSIS — Z961 Presence of intraocular lens: Secondary | ICD-10-CM | POA: Diagnosis not present

## 2016-04-11 DIAGNOSIS — I480 Paroxysmal atrial fibrillation: Secondary | ICD-10-CM | POA: Diagnosis not present

## 2016-04-11 DIAGNOSIS — I10 Essential (primary) hypertension: Secondary | ICD-10-CM | POA: Diagnosis not present

## 2016-04-11 DIAGNOSIS — J309 Allergic rhinitis, unspecified: Secondary | ICD-10-CM | POA: Diagnosis not present

## 2016-04-11 DIAGNOSIS — C50911 Malignant neoplasm of unspecified site of right female breast: Secondary | ICD-10-CM | POA: Diagnosis not present

## 2016-04-11 DIAGNOSIS — N183 Chronic kidney disease, stage 3 (moderate): Secondary | ICD-10-CM | POA: Diagnosis not present

## 2016-04-11 DIAGNOSIS — L719 Rosacea, unspecified: Secondary | ICD-10-CM | POA: Diagnosis not present

## 2016-04-11 DIAGNOSIS — Z7901 Long term (current) use of anticoagulants: Secondary | ICD-10-CM | POA: Diagnosis not present

## 2016-04-11 DIAGNOSIS — Z86010 Personal history of colonic polyps: Secondary | ICD-10-CM | POA: Diagnosis not present

## 2016-04-11 DIAGNOSIS — M85859 Other specified disorders of bone density and structure, unspecified thigh: Secondary | ICD-10-CM | POA: Diagnosis not present

## 2016-04-19 DIAGNOSIS — L570 Actinic keratosis: Secondary | ICD-10-CM | POA: Diagnosis not present

## 2016-04-19 DIAGNOSIS — L718 Other rosacea: Secondary | ICD-10-CM | POA: Diagnosis not present

## 2016-04-20 ENCOUNTER — Telehealth: Payer: Self-pay | Admitting: *Deleted

## 2016-04-20 ENCOUNTER — Other Ambulatory Visit: Payer: Self-pay | Admitting: *Deleted

## 2016-04-20 NOTE — Telephone Encounter (Signed)
Patient called and wanted Dr. Jana Hakim to know that she had her bone density done and her PCP started her on Actonel.

## 2016-04-27 NOTE — Telephone Encounter (Signed)
Msg printed and given to MD as an FYI.  Actonel added to patient's medication list.

## 2016-05-02 DIAGNOSIS — R1313 Dysphagia, pharyngeal phase: Secondary | ICD-10-CM | POA: Diagnosis not present

## 2016-05-03 ENCOUNTER — Other Ambulatory Visit: Payer: Self-pay | Admitting: Family Medicine

## 2016-05-03 DIAGNOSIS — R1313 Dysphagia, pharyngeal phase: Secondary | ICD-10-CM

## 2016-05-04 ENCOUNTER — Ambulatory Visit
Admission: RE | Admit: 2016-05-04 | Discharge: 2016-05-04 | Disposition: A | Discharge status: Home / Self Care | Payer: PPO | Source: Ambulatory Visit | Attending: Family Medicine | Admitting: Family Medicine

## 2016-05-04 DIAGNOSIS — K224 Dyskinesia of esophagus: Secondary | ICD-10-CM | POA: Diagnosis not present

## 2016-05-04 DIAGNOSIS — R1310 Dysphagia, unspecified: Secondary | ICD-10-CM | POA: Diagnosis not present

## 2016-05-04 DIAGNOSIS — R1313 Dysphagia, pharyngeal phase: Secondary | ICD-10-CM

## 2016-05-28 ENCOUNTER — Telehealth: Payer: Self-pay | Admitting: Interventional Cardiology

## 2016-05-28 NOTE — Telephone Encounter (Signed)
Returned pt call. Pt sts that she has been having ongoing fatigue for a couple of months. She is unsure if it is her medications. Pt denies any other symptoms. Sob, chest pain, palpitations, swelling. Pt sts that she checks her VS regularly. Her bp has been good, she reports that he highest systolic readings have been in the 130's. Her heartrate is usually in the high 50's-60's. Pt is on Flecainide 100mg  bid which she usually takes 8:30am and 8:30pm. Pt denies breakthrough Afib since her does was increased from 50mg  bid 2 months ago. Reviewed pt's med list with her.pt is on a couple of anti-hypertensive meds that could cause fatigue that are written by her pcp. Adv pt that Dr.Smith will be out of the office all week. Adv her that Flecainide is helping keep her in NSR and doesn't usually cause fatigue. Adv her to f/u with her pcp regarding fatigue and possible med adjustment. pt should call back if needed or other symptoms develop. Pt agreeable with plan and verbalized understanding. Spoke with Cecille Rubin G.NP (flex) who is also agreeable with plan

## 2016-05-28 NOTE — Telephone Encounter (Signed)
New message      Pt c/o medication issue:  1. Name of Medication: Fiezainide  2. How are you currently taking this medication (dosage and times per day)? 100 mg po twice daily  3. Are you having a reaction (difficulty breathing--STAT)? The pt states it puts her to sleep  4. What is your medication issue? The pt is stating she gets really tied easy and wants to speak with a nurse about the medications

## 2016-05-29 ENCOUNTER — Encounter: Payer: Self-pay | Admitting: Family Medicine

## 2016-06-13 DIAGNOSIS — H10021 Other mucopurulent conjunctivitis, right eye: Secondary | ICD-10-CM | POA: Diagnosis not present

## 2016-06-20 DIAGNOSIS — H10021 Other mucopurulent conjunctivitis, right eye: Secondary | ICD-10-CM | POA: Diagnosis not present

## 2016-07-02 ENCOUNTER — Other Ambulatory Visit: Payer: Self-pay | Admitting: Interventional Cardiology

## 2016-07-09 ENCOUNTER — Other Ambulatory Visit: Payer: Self-pay | Admitting: Oncology

## 2016-07-09 DIAGNOSIS — C50411 Malignant neoplasm of upper-outer quadrant of right female breast: Secondary | ICD-10-CM

## 2016-07-17 ENCOUNTER — Other Ambulatory Visit: Payer: Self-pay | Admitting: *Deleted

## 2016-07-17 DIAGNOSIS — C50411 Malignant neoplasm of upper-outer quadrant of right female breast: Secondary | ICD-10-CM

## 2016-07-18 ENCOUNTER — Ambulatory Visit (HOSPITAL_BASED_OUTPATIENT_CLINIC_OR_DEPARTMENT_OTHER): Payer: PPO | Admitting: Oncology

## 2016-07-18 ENCOUNTER — Other Ambulatory Visit (HOSPITAL_BASED_OUTPATIENT_CLINIC_OR_DEPARTMENT_OTHER): Payer: PPO

## 2016-07-18 ENCOUNTER — Telehealth: Payer: Self-pay | Admitting: Oncology

## 2016-07-18 VITALS — BP 125/64 | HR 55 | Temp 98.0°F | Resp 18 | Ht 66.5 in | Wt 155.3 lb

## 2016-07-18 DIAGNOSIS — I4891 Unspecified atrial fibrillation: Secondary | ICD-10-CM

## 2016-07-18 DIAGNOSIS — C50411 Malignant neoplasm of upper-outer quadrant of right female breast: Secondary | ICD-10-CM

## 2016-07-18 DIAGNOSIS — Z170 Estrogen receptor positive status [ER+]: Secondary | ICD-10-CM

## 2016-07-18 DIAGNOSIS — M8580 Other specified disorders of bone density and structure, unspecified site: Secondary | ICD-10-CM | POA: Diagnosis not present

## 2016-07-18 LAB — CBC WITH DIFFERENTIAL/PLATELET
BASO%: 0.2 % (ref 0.0–2.0)
Basophils Absolute: 0 10*3/uL (ref 0.0–0.1)
EOS%: 3.6 % (ref 0.0–7.0)
Eosinophils Absolute: 0.2 10*3/uL (ref 0.0–0.5)
HCT: 35.6 % (ref 34.8–46.6)
HGB: 12.1 g/dL (ref 11.6–15.9)
LYMPH%: 35.4 % (ref 14.0–49.7)
MCH: 31.8 pg (ref 25.1–34.0)
MCHC: 34 g/dL (ref 31.5–36.0)
MCV: 93.7 fL (ref 79.5–101.0)
MONO#: 0.5 10*3/uL (ref 0.1–0.9)
MONO%: 12.3 % (ref 0.0–14.0)
NEUT#: 2 10*3/uL (ref 1.5–6.5)
NEUT%: 48.5 % (ref 38.4–76.8)
Platelets: 178 10*3/uL (ref 145–400)
RBC: 3.8 10*6/uL (ref 3.70–5.45)
RDW: 14 % (ref 11.2–14.5)
WBC: 4.1 10*3/uL (ref 3.9–10.3)
lymph#: 1.5 10*3/uL (ref 0.9–3.3)

## 2016-07-18 LAB — COMPREHENSIVE METABOLIC PANEL
ALT: 12 U/L (ref 0–55)
AST: 16 U/L (ref 5–34)
Albumin: 3.9 g/dL (ref 3.5–5.0)
Alkaline Phosphatase: 84 U/L (ref 40–150)
Anion Gap: 6 mEq/L (ref 3–11)
BUN: 19.2 mg/dL (ref 7.0–26.0)
CO2: 27 mEq/L (ref 22–29)
Calcium: 9.6 mg/dL (ref 8.4–10.4)
Chloride: 104 mEq/L (ref 98–109)
Creatinine: 1 mg/dL (ref 0.6–1.1)
EGFR: 53 mL/min/{1.73_m2} — ABNORMAL LOW (ref >90)
Glucose: 74 mg/dl (ref 70–140)
Potassium: 4.6 mEq/L (ref 3.5–5.1)
Sodium: 138 mEq/L (ref 136–145)
Total Bilirubin: 0.43 mg/dL (ref 0.20–1.20)
Total Protein: 6.7 g/dL (ref 6.4–8.3)

## 2016-07-18 NOTE — Progress Notes (Signed)
[place]  [phone]) [phone] Fax:(336) [phone]   ID: Katherine Powell OB: [birth year]  MR#: [id number]  FHL#:[id number]  PCP: Katherine Caraway, MD GYN:   SUCoralie Powell OTHER MD: Katherine Powell, Katherine Powell, Katherine Powell, Katherine Powell  CHIEF COMPLAINT: Estrogen receptor positive breast cancer  Current treatment: Anastrozole, denosumab/Prolia  HISTORY OF PRESENT ILLNESS: From the original intake note:  Katherine Powell had routine screening mammography at [facility] 10/14/2013 showing a 1 cm irregular mass in the right breast. Right digital mammography and ultrasonography of the right breast 10/21/2013 showed a mass with indistinct margins at the 12:00 position which by ultrasound measured 1 cm, and was lobulated. Biopsy was recommended, but the patient opted to proceed directly with lumpectomy, and this was performed 11/09/2013. The final pathology ([id number]) showed an invasive ductal carcinoma, grade 2, measuring 1.1 cm, with low-grade ductal carcinoma in situ. The tumor was estrogen receptor 100% positive, and progesterone receptor 94% positive, both with strong staining intensity. The MIB-1 was 10%. There was no HER-2 amplification with a ratio by CISH of 0.90, and the number per cell being 1.80.  The patient's subsequent history is as detailed below   INTERVAL HISTORY: She returns for follow-up of her estrogen receptor positive breast cancer. She continues on anastrozole. She tolerates this generally well. She does have some hot flashes, but she has learned to live with these she says. Clonidine helps, but the patch "runs out of her" after 3 or 4 days. Vaginal dryness is not a problem She obtains the drug at a good price.  REVIEW OF SYSTEMS: She exercises regularly doing yoga and weights chiefly. She is unremarkable and for her atrial fibrillation with no bleeding complications. She has been a little bit constipated. She has some stress urinary incontinence. Otherwise a  detailed review of systems today was stable  PAST MEDICAL HISTORY: Past Medical History:  Diagnosis Date  . Anxiety   . Arrhythmia    atrial fibrillation/  on   . Arthritis   . Atrial fibrillation ([place])   . Breast cancer ([place]) 11/09/13   right upper outer  . Contact lens/glasses fitting    contact rt eye  . Hx of radiation therapy 12/15/13- 01/11/14   right breast 4250 cGy in 17 sessions, (hypo-fractionated), no boost  . Hypertension   . Postmenopausal   . Rosacea   . Seasonal allergies   . Sleep disturbance   . Thyroid disease   . Urinary incontinence     PAST SURGICAL HISTORY: Past Surgical History:  Procedure Laterality Date  . ABDOMINAL HYSTERECTOMY  1973   endometriosis  . APPENDECTOMY  1954  . BACK SURGERY     lumb-2005  . BILATERAL OOPHORECTOMY     benign tumor on ovary  . BREAST LUMPECTOMY WITH NEEDLE LOCALIZATION Right 11/09/2013   Procedure: BREAST LUMPECTOMY WITH NEEDLE LOCALIZATION;  Surgeon: Katherine Bowie, MD;  Location: [facility];  Service: General;  Laterality: Right;  . CATARACT EXTRACTION  2013   both  . CYSTOCELE REPAIR  2008  . EYE SURGERY     cataracts-plugs for eyes  . RECTOCELE REPAIR  2008  . TONSILLECTOMY      FAMILY HISTORY Family History  Problem Relation Age of Onset  . Alzheimer's disease Mother   . Heart attack Neg Hx   . Stroke Father    the patient's father died from COPD at age 41 in the setting of tobacco abuse. The patient's mother died at the age of  69 with Alzheimer's disease. The patient had no brothers, one sister. There is no history of breast or ovarian cancer in the family to her knowledge  GYNECOLOGIC HISTORY:  Menarche age 28, first live birth age 25. The patient is GX P3. She underwent simple hysterectomy in 1973 and took hormone replacement (initially Prempro later estrogens only) until December 2014. 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 Her husband Katherine Powell was an Chief Financial Officer for [employer]. He died 26-Apr-2015 from prostate cancer.Their son Katherine Powell lives in [place]. where he works in Engineer, technical sales. Daughter Katherine Powell lives in [place] where she works as a Designer, jewellery in an independent clinic. Daughter Katherine Powell listened Katherine Powell. She works as a Tree surgeon. The patient has 7 grandchildren, 2 of whom are getting married in the winter of 2016. She is a Tourist information centre manager.    ADVANCED DIRECTIVES: In place; the patient has named her daughter, Katherine Powell, is a Designer, jewellery, as her healthcare power of attorney. And can be reached at [phone].   HEALTH MAINTENANCE: Social History  Substance Use Topics  . Smoking status: Never Smoker  . Smokeless tobacco: Never Used  . Alcohol use No     Colonoscopy:  PAP:  Bone density: At [facility] 09/27/2010, normal  Lipid panel:  Allergies  Allergen Reactions  . Codeine Nausea Only    nausea    Current Outpatient Prescriptions  Medication Sig Dispense Refill  . acetaminophen (TYLENOL) 650 MG CR tablet Take 650 mg by mouth every 8 (eight) hours as needed for pain. Reported on 01/19/2016    . ALPRAZolam (XANAX) 0.5 MG tablet Take 0.5 mg by mouth at bedtime as needed for anxiety.    [name] anastrozole (ARIMIDEX) 1 MG tablet TAKE 1 TABLET BY MOUTH DAILY 90 tablet 0  . calcium carbonate (OS-CAL) 600 MG TABS tablet Take 600 mg by mouth 2 (two) times daily with a meal.    . CARTIA XT 120 MG 24 hr capsule Take 120 mg by mouth daily.     . cholecalciferol (VITAMIN D) 400 UNITS TABS tablet Take 2,000 Units by mouth.     . cloNIDine (CATAPRES - DOSED IN MG/24 HR) 0.1 mg/24hr patch Place 1 patch (0.1 mg total) onto the skin once a week. 12 patch 3  . cycloSPORINE (RESTASIS) 0.05 % ophthalmic emulsion Place 1 drop into both eyes continuous as needed.    .  fexofenadine (ALLEGRA) 180 MG tablet Take 180 mg by mouth continuous as needed for allergies or rhinitis.    . flecainide (TAMBOCOR) 100 MG tablet TAKE 1 TABLET BY MOUTH 2 TIMES DAILY. 180 tablet 1  . fluticasone (FLONASE) 50 MCG/ACT nasal spray Place into both nostrils daily. Reported on 01/19/2016    . glucosamine-chondroitin 500-400 MG tablet Take 1 tablet by mouth 2 (two) times daily.     . Risedronate Sodium (ACTONEL PO) Take by mouth.    . rivaroxaban (XARELTO) 20 MG TABS tablet Take 1 tablet (20 mg total) by mouth daily with supper.    . sodium chloride (OCEAN) 0.65 % nasal spray Place 1 spray into the nose as needed for congestion. Reported on 01/19/2016    . trolamine salicylate (ASPERCREME) 10 % cream Apply 1 application topically as needed for muscle pain.    [name] venlafaxine (EFFEXOR) 37.5 MG tablet Take 1  tablet (37.5 mg total) by mouth 2 (two) times daily with a meal. Pt takes 75 mg in the am and 37.5 mg in the pm. 30 tablet 0  . vitamin E 400 UNIT capsule Take 400 Units by mouth daily.     No current facility-administered medications for this visit.     OBJECTIVE: Older white woman Who appears stated age 82:   07/18/16 1032  BP: 125/64  Pulse: (!) 55  Resp: 18  Temp: 98 F (36.7 C)     Body mass index is 24.69 kg/m.    ECOG FS:1 - Symptomatic but completely ambulatory  Sclerae unicteric, pupils round and equal Oropharynx clear and moist-- no thrush or other lesions No cervical or supraclavicular adenopathy Lungs no rales or rhonchi Heart regular rate and rhythm Abd soft, nontender, positive bowel sounds MSK no focal spinal tenderness, no upper extremity lymphedema. Hands show significant arthritic changes Neuro: nonfocal, well oriented, appropriate affect Breasts: The right breast is status post lumpectomy and radiation. There is no evidence of local recurrence. The right axilla is benign. Left breast is unremarkable.    LAB RESULTS:  CMP     Component Value  Date/Time   NA 135 12/30/2015 1132   NA 138 09/21/2015 1232   K 4.3 12/30/2015 1132   K 4.1 09/21/2015 1232   CL 102 12/30/2015 1132   CO2 28 12/30/2015 1132   CO2 25 09/21/2015 1232   GLUCOSE 85 12/30/2015 1132   GLUCOSE 96 09/21/2015 1232   BUN 21 12/30/2015 1132   BUN 14.7 09/21/2015 1232   CREATININE 0.92 (H) 12/30/2015 1132   CREATININE 0.9 09/21/2015 1232   CALCIUM 9.0 12/30/2015 1132   CALCIUM 9.3 09/21/2015 1232   PROT 6.2 (L) 09/21/2015 1232   ALBUMIN 3.8 09/21/2015 1232   AST 18 09/21/2015 1232   ALT 12 09/21/2015 1232   ALKPHOS 86 09/21/2015 1232   BILITOT 0.37 09/21/2015 1232   GFRNONAA 60 (L) 11/09/2013 1200   GFRAA 70 (L) 11/09/2013 1200    I No results found for: SPEP  Lab Results  Component Value Date   WBC 4.1 07/18/2016   NEUTROABS 2.0 07/18/2016   HGB 12.1 07/18/2016   HCT 35.6 07/18/2016   MCV 93.7 07/18/2016   PLT 178 07/18/2016      Chemistry      Component Value Date/Time   NA 135 12/30/2015 1132   NA 138 09/21/2015 1232   K 4.3 12/30/2015 1132   K 4.1 09/21/2015 1232   CL 102 12/30/2015 1132   CO2 28 12/30/2015 1132   CO2 25 09/21/2015 1232   BUN 21 12/30/2015 1132   BUN 14.7 09/21/2015 1232   CREATININE 0.92 (H) 12/30/2015 1132   CREATININE 0.9 09/21/2015 1232      Component Value Date/Time   CALCIUM 9.0 12/30/2015 1132   CALCIUM 9.3 09/21/2015 1232   ALKPHOS 86 09/21/2015 1232   AST 18 09/21/2015 1232   ALT 12 09/21/2015 1232   BILITOT 0.37 09/21/2015 1232       No results found for: LABCA2  No components found for: LABCA125  No results for input(s): INR in the last 168 hours.  Urinalysis No results found for: COLORURINE  STUDIES: No results found.   Mammogram at [facility] late January 2017. Unremarkable per patient.    ASSESSMENT: 82 y.o. Katherine Powell woman  (1) Status post right Breast upper outer quadrant lumpectomy 11/09/2013 for a pT1c NX, stage IA invasive ductal carcinoma, grade 2, estrogen receptor  100%  positive, progesterone receptor 94% positive, with an MIB-1 of 10%, and no HER-2 amplification.  (2) adjuvant radiation completed February 2015.  (3) anastrozole started February 2015  (a) bone density at [facility] February 2015 shows mild osteopenia with a T score of -1.3  (b) bone density at [facility] 03/29/2016 shows a T score of -1.7. He is weak her W and motor. No pericardial from syndrome hours  (4) the patient is status post hysterectomy and bilateral salpingo-oophorectomy  (5) atrial fibrillation, on Rivaroxaban  PLAN: Katherine Powell is now 2-1/2 years out from definitive surgery for her breast cancer with no evidence of  recurrence. This is very favorable.   She has been on anastrozole 2 years. Today we reviewed her repeat bone density which shows further loss of bone. We then discussed switching to tamoxifen in detail. After much discussion we decided given the fact that she is at increased risk of clotting because of A. fib, which is the reason she is on rivaroxaban, we would stay away from that medication  Instead we are going to add denosumab/Prolia, to her schedule. We discussed the possible toxicities, side effects and complications of this agent and I have written for her to receive the first dose next week. If she does well she will have a second mass when she sees me again in 6 months from now. From that point I will start seeing her on a once a year basis.  She knows to call for any problems that may develop before her next visit here. Chauncey Cruel, MD   07/18/2016 10:40 AM

## 2016-07-18 NOTE — Telephone Encounter (Signed)
appt made and avs printed °

## 2016-07-25 ENCOUNTER — Ambulatory Visit (HOSPITAL_BASED_OUTPATIENT_CLINIC_OR_DEPARTMENT_OTHER): Payer: PPO

## 2016-07-25 VITALS — HR 50 | Temp 97.8°F | Resp 18

## 2016-07-25 DIAGNOSIS — C50411 Malignant neoplasm of upper-outer quadrant of right female breast: Secondary | ICD-10-CM

## 2016-07-25 DIAGNOSIS — Z79811 Long term (current) use of aromatase inhibitors: Secondary | ICD-10-CM | POA: Diagnosis not present

## 2016-07-25 DIAGNOSIS — M8580 Other specified disorders of bone density and structure, unspecified site: Secondary | ICD-10-CM | POA: Diagnosis not present

## 2016-07-25 MED ORDER — DENOSUMAB 60 MG/ML ~~LOC~~ SOLN
60.0000 mg | Freq: Once | SUBCUTANEOUS | Status: AC
  Administered 2016-07-25: 60 mg via SUBCUTANEOUS
  Filled 2016-07-25: qty 1

## 2016-07-25 NOTE — Patient Instructions (Signed)
Denosumab injection  What is this medicine?  DENOSUMAB (den oh sue mab) slows bone breakdown. Prolia is used to treat osteoporosis in women after menopause and in men. Xgeva is used to prevent bone fractures and other bone problems caused by cancer bone metastases. Xgeva is also used to treat giant cell tumor of the bone.  This medicine may be used for other purposes; ask your health care provider or pharmacist if you have questions.  What should I tell my health care provider before I take this medicine?  They need to know if you have any of these conditions:  -dental disease  -eczema  -infection or history of infections  -kidney disease or on dialysis  -low blood calcium or vitamin D  -malabsorption syndrome  -scheduled to have surgery or tooth extraction  -taking medicine that contains denosumab  -thyroid or parathyroid disease  -an unusual reaction to denosumab, other medicines, foods, dyes, or preservatives  -pregnant or trying to get pregnant  -breast-feeding  How should I use this medicine?  This medicine is for injection under the skin. It is given by a health care professional in a hospital or clinic setting.  If you are getting Prolia, a special MedGuide will be given to you by the pharmacist with each prescription and refill. Be sure to read this information carefully each time.  For Prolia, talk to your pediatrician regarding the use of this medicine in children. Special care may be needed. For Xgeva, talk to your pediatrician regarding the use of this medicine in children. While this drug may be prescribed for children as young as 13 years for selected conditions, precautions do apply.  Overdosage: If you think you have taken too much of this medicine contact a poison control center or emergency room at once.  NOTE: This medicine is only for you. Do not share this medicine with others.  What if I miss a dose?  It is important not to miss your dose. Call your doctor or health care professional if you are  unable to keep an appointment.  What may interact with this medicine?  Do not take this medicine with any of the following medications:  -other medicines containing denosumab  This medicine may also interact with the following medications:  -medicines that suppress the immune system  -medicines that treat cancer  -steroid medicines like prednisone or cortisone  This list may not describe all possible interactions. Give your health care provider a list of all the medicines, herbs, non-prescription drugs, or dietary supplements you use. Also tell them if you smoke, drink alcohol, or use illegal drugs. Some items may interact with your medicine.  What should I watch for while using this medicine?  Visit your doctor or health care professional for regular checks on your progress. Your doctor or health care professional may order blood tests and other tests to see how you are doing.  Call your doctor or health care professional if you get a cold or other infection while receiving this medicine. Do not treat yourself. This medicine may decrease your body's ability to fight infection.  You should make sure you get enough calcium and vitamin D while you are taking this medicine, unless your doctor tells you not to. Discuss the foods you eat and the vitamins you take with your health care professional.  See your dentist regularly. Brush and floss your teeth as directed. Before you have any dental work done, tell your dentist you are receiving this medicine.  Do   not become pregnant while taking this medicine or for 5 months after stopping it. Women should inform their doctor if they wish to become pregnant or think they might be pregnant. There is a potential for serious side effects to an unborn child. Talk to your health care professional or pharmacist for more information.  What side effects may I notice from receiving this medicine?  Side effects that you should report to your doctor or health care professional as soon as  possible:  -allergic reactions like skin rash, itching or hives, swelling of the face, lips, or tongue  -breathing problems  -chest pain  -fast, irregular heartbeat  -feeling faint or lightheaded, falls  -fever, chills, or any other sign of infection  -muscle spasms, tightening, or twitches  -numbness or tingling  -skin blisters or bumps, or is dry, peels, or red  -slow healing or unexplained pain in the mouth or jaw  -unusual bleeding or bruising  Side effects that usually do not require medical attention (Report these to your doctor or health care professional if they continue or are bothersome.):  -muscle pain  -stomach upset, gas  This list may not describe all possible side effects. Call your doctor for medical advice about side effects. You may report side effects to FDA at 1-800-FDA-1088.  Where should I keep my medicine?  This medicine is only given in a clinic, doctor's office, or other health care setting and will not be stored at home.  NOTE: This sheet is a summary. It may not cover all possible information. If you have questions about this medicine, talk to your doctor, pharmacist, or health care provider.      2016, Elsevier/Gold Standard. (2012-05-19 12:37:47)

## 2016-07-31 ENCOUNTER — Encounter: Payer: Self-pay | Admitting: Interventional Cardiology

## 2016-07-31 ENCOUNTER — Ambulatory Visit (INDEPENDENT_AMBULATORY_CARE_PROVIDER_SITE_OTHER): Payer: PPO | Admitting: Interventional Cardiology

## 2016-07-31 VITALS — BP 164/88 | HR 58 | Ht 67.0 in | Wt 152.6 lb

## 2016-07-31 DIAGNOSIS — C50411 Malignant neoplasm of upper-outer quadrant of right female breast: Secondary | ICD-10-CM | POA: Diagnosis not present

## 2016-07-31 DIAGNOSIS — Z5181 Encounter for therapeutic drug level monitoring: Secondary | ICD-10-CM | POA: Diagnosis not present

## 2016-07-31 DIAGNOSIS — Z7901 Long term (current) use of anticoagulants: Secondary | ICD-10-CM | POA: Diagnosis not present

## 2016-07-31 DIAGNOSIS — I480 Paroxysmal atrial fibrillation: Secondary | ICD-10-CM

## 2016-07-31 DIAGNOSIS — Z79899 Other long term (current) drug therapy: Secondary | ICD-10-CM | POA: Diagnosis not present

## 2016-07-31 NOTE — Progress Notes (Signed)
Cardiology Office Note    Date:  07/31/2016   ID:  Katherine, Powell 02/06/1932, MRN [id number]  PCP:  Cari Caraway, MD  Cardiologist: Sinclair Grooms, MD   Chief Complaint  Patient presents with  . Atrial Fibrillation    History of Present Illness:  Katherine Powell is a 82 y.o. female for follow-up of paroxysmal atrial fibrillation.   She remained stable on the current dose of flecainide. No prolonged atrial fibrillation. No episodes of syncope.  He is under significant emotional stress at this time because of having to take out a reverse mortgage on her house. Her husband died within the past year.   Past Medical History:  Diagnosis Date  . Anxiety   . Arrhythmia    atrial fibrillation/  on   . Arthritis   . Atrial fibrillation ([place])   . Breast cancer ([place]) 11/09/13   right upper outer  . Contact lens/glasses fitting    contact rt eye  . Hx of radiation therapy 12/15/13- 01/11/14   right breast 4250 cGy in 17 sessions, (hypo-fractionated), no boost  . Hypertension   . Postmenopausal   . Rosacea   . Seasonal allergies   . Sleep disturbance   . Thyroid disease   . Urinary incontinence     Past Surgical History:  Procedure Laterality Date  . ABDOMINAL HYSTERECTOMY  1973   endometriosis  . APPENDECTOMY  1954  . BACK SURGERY     lumb-2005  . BILATERAL OOPHORECTOMY     benign tumor on ovary  . BREAST LUMPECTOMY WITH NEEDLE LOCALIZATION Right 11/09/2013   Procedure: BREAST LUMPECTOMY WITH NEEDLE LOCALIZATION;  Surgeon: Harl Bowie, MD;  Location: [facility];  Service: General;  Laterality: Right;  . CATARACT EXTRACTION  2013   both  . CYSTOCELE REPAIR  2008  . EYE SURGERY     cataracts-plugs for eyes  . RECTOCELE REPAIR  2008  . TONSILLECTOMY      Current Medications: Outpatient Medications Prior to Visit  Medication Sig Dispense Refill  . acetaminophen (TYLENOL) 650 MG CR tablet Take 650 mg by mouth every 8 (eight) hours as needed  for pain. Reported on 01/19/2016    . ALPRAZolam (XANAX) 0.5 MG tablet Take 0.5 mg by mouth at bedtime as needed for anxiety.    [name] anastrozole (ARIMIDEX) 1 MG tablet TAKE 1 TABLET BY MOUTH DAILY 90 tablet 0  . CARTIA XT 120 MG 24 hr capsule Take 120 mg by mouth daily.     . cholecalciferol (VITAMIN D) 400 UNITS TABS tablet Take 2,000 Units by mouth.     . cloNIDine (CATAPRES - DOSED IN MG/24 HR) 0.1 mg/24hr patch Place 1 patch (0.1 mg total) onto the skin once a week. 12 patch 3  . cycloSPORINE (RESTASIS) 0.05 % ophthalmic emulsion Place 1 drop into both eyes continuous as needed.    . fexofenadine (ALLEGRA) 180 MG tablet Take 180 mg by mouth continuous as needed for allergies or rhinitis.    . flecainide (TAMBOCOR) 100 MG tablet TAKE 1 TABLET BY MOUTH 2 TIMES DAILY. 180 tablet 1  . fluticasone (FLONASE) 50 MCG/ACT nasal spray Place 1 spray into both nostrils daily as needed for allergies. Reported on 01/19/2016    . glucosamine-chondroitin 500-400 MG tablet Take 1 tablet by mouth 2 (two) times daily.     . Risedronate Sodium (ACTONEL PO) Take 1 tablet by mouth daily.     . rivaroxaban (XARELTO) 20 MG  TABS tablet Take 1 tablet (20 mg total) by mouth daily with supper.    . sodium chloride (OCEAN) 0.65 % nasal spray Place 1 spray into the nose as needed for congestion. Reported on 01/19/2016    . trolamine salicylate (ASPERCREME) 10 % cream Apply 1 application topically as needed for muscle pain.    . calcium carbonate (OS-CAL) 600 MG TABS tablet Take 600 mg by mouth 2 (two) times daily with a meal.    . venlafaxine (EFFEXOR) 37.5 MG tablet Take 1 tablet (37.5 mg total) by mouth 2 (two) times daily with a meal. Pt takes 75 mg in the am and 37.5 mg in the pm. (Patient not taking: Reported on 07/31/2016) 30 tablet 0  . vitamin E 400 UNIT capsule Take 400 Units by mouth daily.     No facility-administered medications prior to visit.      Allergies:   Codeine   Social History   Social History  .  Marital status: Married    Spouse name: N/A  . Number of children: N/A  . Years of education: N/A   Social History Main Topics  . Smoking status: Never Smoker  . Smokeless tobacco: Never Used  . Alcohol use No  . Drug use: No  . Sexual activity: No     Comment: menarche age 10, P60, first live birth age 23,  HRT x 30 years   Other Topics Concern  . None   Social History Narrative  . None     Family History:  The patient's family history includes Alzheimer's disease in her mother; Stroke in her father.   ROS:   Please see the history of present illness.     has vision disturbance, joint discomfort , back pain, unexplained weight gain , difficulty with balance and walking. Recurring episodes of  All other systems reviewed and are negative.   PHYSICAL EXAM:   VS:  BP (!) 164/88   Pulse (!) 58   Ht 5\' 7"  (1.702 m)   Wt 152 lb 9.6 oz (69.2 kg)   BMI 23.90 kg/m    GEN: Well nourished, well developed, in no acute distress  HEENT: normal  Neck: no JVD, carotid bruits, or masses Cardiac: RRR; no murmurs, rubs, or gallops,no edema  Respiratory:  clear to auscultation bilaterally, normal work of breathing GI: soft, nontender, nondistended, + BS MS: no deformity or atrophy  Skin: warm and dry, no rash Neuro:  Alert and Oriented x 3, Strength and sensation are intact Psych: euthymic mood, full affect  Wt Readings from Last 3 Encounters:  07/31/16 152 lb 9.6 oz (69.2 kg)  07/18/16 155 lb 4.8 oz (70.4 kg)  01/19/16 154 lb 12.8 oz (70.2 kg)      Studies/Labs Reviewed:   EKG:  EKG   Sinus bradycardia 58 bpm. Incomplete right bundle. Otherwise unremarkable.  Recent Labs: 12/30/2015: TSH 1.215 07/18/2016: ALT 12; BUN 19.2; Creatinine 1.0; HGB 12.1; Platelets 178; Potassium 4.6; Sodium 138   Lipid Panel No results found for: CHOL, TRIG, HDL, CHOLHDL, VLDL, LDLCALC, LDLDIRECT  Additional studies/ records that were reviewed today include:      ASSESSMENT:    1.  Paroxysmal atrial fibrillation ([facility])   2. Chronic anticoagulation   3. Encounter for monitoring flecainide therapy   4. Breast cancer of upper-outer quadrant of right female breast ([place])      PLAN:  In order of problems listed above:  1.  Stable on flecainide therapy. 2.  No bleeding  complications on Xarelto. 3.  No complications or side effects on flecainide.    Medication Adjustments/Labs and Tests Ordered: Current medicines are reviewed at length with the patient today.  Concerns regarding medicines are outlined above.  Medication changes, Labs and Tests ordered today are listed in the Patient Instructions below. There are no Patient Instructions on file for this visit.   Signed, Sinclair Grooms, MD  07/31/2016 11:47 AM    [facility] [place], [place], [place]  [address] Phone: [phone]; Fax: [phone]

## 2016-07-31 NOTE — Patient Instructions (Signed)
Medication Instructions:  Your physician recommends that you continue on your current medications as directed. Please refer to the Current Medication list given to you today.   Labwork: None   Testing/Procedures: None   Follow-Up: Your physician wants you to follow-up in: 1 year with Dr Tamala Julian. (August 2018). You will receive a reminder letter in the mail two months in advance. If you don't receive a letter, please call our office to schedule the follow-up appointment.   Any Other Special Instructions Will Be Listed Below (If Applicable). Your physician has requested that you regularly monitor and record your blood pressure readings at home. Please use the same machine at the same time of day to check your readings and record them.  Check your blood pressure 2 or 3 times a week for a month and record them.  Call our office in a month and give [place] the readings. [phone]      If you need a refill on your cardiac medications before your next appointment, please call your pharmacy.

## 2016-08-08 ENCOUNTER — Telehealth: Payer: Self-pay | Admitting: *Deleted

## 2016-08-08 NOTE — Telephone Encounter (Signed)
Received call from pt asking if bone med could cause face peeling.  She also wants to know if she could increase her clonidine patch to twice weekly b/c hot flashes return in 3-4 days after patch applied.  Discussed with Dr Jana Hakim & he hasn't heard of anyone with this S. E. from [facility] & reports no to increasing clonidine but may need to try something different.  Has she had Effexor or gabapentin?  Left message on pt vm to call back.

## 2016-08-14 ENCOUNTER — Telehealth: Payer: Self-pay

## 2016-08-14 NOTE — Telephone Encounter (Signed)
Pt has mild skin reaction from clonidine patch. It gets red under the patch and itches some. Hydrocortisone cream takes away the itch. The redness goes away in a few days. No broken skin. Discussed rotating placement and discussed using upper arms. She had only been using her upper breast area. She will call if skin issue worsens.

## 2016-08-21 DIAGNOSIS — M5136 Other intervertebral disc degeneration, lumbar region: Secondary | ICD-10-CM | POA: Diagnosis not present

## 2016-08-21 DIAGNOSIS — M545 Low back pain: Secondary | ICD-10-CM | POA: Diagnosis not present

## 2016-08-21 DIAGNOSIS — Z23 Encounter for immunization: Secondary | ICD-10-CM | POA: Diagnosis not present

## 2016-08-23 ENCOUNTER — Telehealth: Payer: Self-pay | Admitting: *Deleted

## 2016-08-23 MED ORDER — CLONIDINE HCL 0.1 MG PO TABS: 0.1000 mg | ORAL_TABLET | Freq: Two times a day (BID) | ORAL | 2 refills | Status: DC | PRN

## 2016-08-23 NOTE — Telephone Encounter (Signed)
This RN spoke with pt per her call stating current charge to her for catapres 0.1 mg patches is $90- she is inquiring about taking a supplement called estrovive instead.  This RN informed pt per review of above supplement noted high concentration of black cohosh and soy iso-proteins which we do not have data in regard to her history of breast cancer or interaction with current anti estrogen she is taking.  Discussed with pt supplement - peridin C as well as possible benefit to clonidine tablets which may be less expensive.  Pt is agreeable to use of tablets and requested prescription to be sent to [facility] if cost is less then $50.  This RN called [facility] and obtained an out of pocket cost of $13.56.  Prescription sent to pharmacy.

## 2016-08-24 ENCOUNTER — Telehealth: Payer: Self-pay | Admitting: *Deleted

## 2016-08-24 NOTE — Telephone Encounter (Signed)
This RN called pt to follow up on use of clonidine oral vs transdermal for hot flashes.  Pt verbalized understanding to not take the oral clonidine until she has removed the patch for at least 24 hours.  She also understands known side effect relating to sleepiness- she will be aware and understands when she wakes up to take her time getting up.  She also knows to call if any concerns.  At present she has enough patches for 2 weeks and will not start the clonidine oral until she is out of the patches.

## 2016-08-29 ENCOUNTER — Ambulatory Visit: Payer: PPO | Attending: Family Medicine | Admitting: Physical Therapy

## 2016-08-29 ENCOUNTER — Encounter: Payer: Self-pay | Admitting: Physical Therapy

## 2016-08-29 DIAGNOSIS — R262 Difficulty in walking, not elsewhere classified: Secondary | ICD-10-CM | POA: Insufficient documentation

## 2016-08-29 DIAGNOSIS — M62830 Muscle spasm of back: Secondary | ICD-10-CM | POA: Insufficient documentation

## 2016-08-29 DIAGNOSIS — M5441 Lumbago with sciatica, right side: Secondary | ICD-10-CM | POA: Diagnosis not present

## 2016-08-29 NOTE — Therapy (Signed)
[address] [place], [place], [address] Phone: [phone]   Fax:  [phone]  Physical Therapy Evaluation  Patient Details  Name: Katherine Powell MRN: [id number] Date of Birth: 24-Jul-1932 Referring Provider: Edwin Dada  Encounter Date: 08/29/2016      PT End of Session - 08/29/16 1606    Visit Number 1   Date for PT Re-Evaluation 10/29/16   PT Start Time [id number]   PT Stop Time 1610   PT Time Calculation (min) 53 min   Activity Tolerance Patient tolerated treatment well   Behavior During Therapy [facility] for tasks assessed/performed      Past Medical History:  Diagnosis Date  . Anxiety   . Arrhythmia    atrial fibrillation/  on   . Arthritis   . Atrial fibrillation ([place])   . Breast cancer ([place]) 11/09/13   right upper outer  . Contact lens/glasses fitting    contact rt eye  . Hx of radiation therapy 12/15/13- 01/11/14   right breast 4250 cGy in 17 sessions, (hypo-fractionated), no boost  . Hypertension   . Postmenopausal   . Rosacea   . Seasonal allergies   . Sleep disturbance   . Thyroid disease   . Urinary incontinence     Past Surgical History:  Procedure Laterality Date  . ABDOMINAL HYSTERECTOMY  1973   endometriosis  . APPENDECTOMY  1954  . BACK SURGERY     lumb-2005  . BILATERAL OOPHORECTOMY     benign tumor on ovary  . BREAST LUMPECTOMY WITH NEEDLE LOCALIZATION Right 11/09/2013   Procedure: BREAST LUMPECTOMY WITH NEEDLE LOCALIZATION;  Surgeon: Harl Bowie, MD;  Location: [place];  Service: General;  Laterality: Right;  . CATARACT EXTRACTION  2013   both  . CYSTOCELE REPAIR  2008  . EYE SURGERY     cataracts-plugs for eyes  . RECTOCELE REPAIR  2008  . TONSILLECTOMY      There were no vitals filed for this visit.       Subjective Assessment - 08/29/16 1524    Subjective Patient has had low back pain with pain inthe right posterior leg, she has a history  of lumbar fusion about 7 years ago.  She reports that current pain has been about 2 months.  She reports she did lift a pot that may have caused pain she also reports she has been going to the driving range and hitting golf balls   Patient Stated Goals get up and down from sitting, walk without difficulty   Currently in Pain? Yes   Pain Score 2    Pain Location Back   Pain Orientation Lower   Pain Descriptors / Indicators Aching;Dull   Pain Type Acute pain   Pain Onset More than a month ago   Pain Frequency Intermittent   Aggravating Factors  going up and down stairs, getting up from sitting pain will go up to 10/10, bending over increases the pain   Pain Relieving Factors lying down on her back can alleviate the pain   Effect of Pain on Daily Activities difficulty walking, doing housework            [facility] PT Assessment - 08/29/16 0001      Assessment   Medical Diagnosis low back pain   Referring Provider Edwin Dada   Onset Date/Surgical Date 07/29/16   Prior Therapy for shoulder earlier in the year     Precautions  Precautions None     Balance Screen   Has the patient fallen in the past 6 months No   Has the patient had a decrease in activity level because of a fear of falling?  No   Is the patient reluctant to leave their home because of a fear of falling?  No     Home Environment   Additional Comments lives alone, does her own housework     Prior Function   Level of Independence Independent   Vocation Retired   Leisure yoga twice a week, does some weight at the gym and started hitting golf balls recently     Posture/Postural Control   Posture Comments some scoliosis     ROM / Strength   AROM / PROM / Strength AROM;Strength     AROM   Overall AROM Comments lumbar ROM was decreased 50% with pain int he back and the right LE     Strength   Overall Strength Comments 4-/5 for the hips due to pain in the hips and low back     Flexibility   Soft Tissue Assessment  /Muscle Length --  tight HS, calf and piriformis r>L     Palpation   Palpation comment very tender in the right GT and SI/buttock area, she is tender and very tight in the lumbar parapsinals, has some tenderness in the left buttock as well     Ambulation/Gait   Gait Comments gait with antalgic on the right, she needed a mod/max A to go up stairs due to her c/o pain in the hips especially the right                   [facility] Adult PT Treatment/Exercise - 08/29/16 0001      Electrical Stimulation   Electrical Stimulation Location right buttock area   Electrical Stimulation Action IFC   Electrical Stimulation Parameters sitting   Electrical Stimulation Goals Pain                  PT Short Term Goals - 08/29/16 1614      PT SHORT TERM GOAL #1   Title independent with initial HEP   Time 2   Period Weeks   Status New           PT Long Term Goals - 08/29/16 1614      PT LONG TERM GOAL #1   Title decrease pain 50%   Time 8   Period Weeks   Status New     PT LONG TERM GOAL #2   Title increase lumbar ROM 25%   Time 8   Period Weeks   Status New     PT LONG TERM GOAL #3   Title go up and down stairs without difficulty   Time 8   Period Weeks   Status New     PT LONG TERM GOAL #4   Title report able to return to yoga without difficulty   Time 8   Period Weeks   Status New               Plan - 08/29/16 1607    Clinical Impression Statement Patient reports that she has bene having LBP and some pain into the legs over the past few months, she is unsure of a specific cause but reports that she lifted a potted plant at home and also started hitting golf balls recently.   Rehab Potential Good   PT Frequency 1x /  week   PT Duration 8 weeks   PT Treatment/Interventions ADLs/Self Care Home Management;Electrical Stimulation;Cryotherapy;Moist Heat;Therapeutic exercise;Therapeutic activities;Ultrasound;Manual techniques;Taping;Patient/family  education   PT Next Visit Plan Patient will need flexibility and core strength   Consulted and Agree with Plan of Care Patient      Patient will benefit from skilled therapeutic intervention in order to improve the following deficits and impairments:  Decreased activity tolerance, Decreased mobility, Decreased strength, Decreased range of motion, Difficulty walking, Impaired flexibility, Impaired perceived functional ability, Increased muscle spasms, Improper body mechanics, Pain  Visit Diagnosis: Right-sided low back pain with right-sided sciatica - Plan: PT plan of care cert/re-cert  Difficulty in walking, not elsewhere classified - Plan: PT plan of care cert/re-cert  Muscle spasm of back - Plan: PT plan of care cert/re-cert      G-Codes - [id number]    Functional Assessment Tool Used foto 70% limitation   Functional Limitation Other PT primary   Other PT Primary Current Status [id number]) At least 60 percent but less than 80 percent impaired, limited or restricted   Other PT Primary Goal Status [id number]) At least 40 percent but less than 60 percent impaired, limited or restricted       Problem List Patient Active Problem List   Diagnosis Date Noted  . Osteopenia 07/18/2016  . Chronic anticoagulation 12/29/2014  . Encounter for monitoring flecainide therapy 12/29/2014  . Paroxysmal atrial fibrillation ([place]) 12/29/2014  . Postmenopausal estrogen deficiency 01/27/2014  . Hot flashes, menopausal 01/27/2014  . Breast cancer of upper-outer quadrant of right female breast ([place]) 11/17/2013    [name], PT 08/29/2016, 4:25 PM  [address] [place], [place], [address] Phone: [phone]   Fax:  [phone]  Name: Katherine Powell MRN: [id number] Date of Birth: 1932-09-19

## 2016-08-31 ENCOUNTER — Telehealth: Payer: Self-pay | Admitting: Interventional Cardiology

## 2016-08-31 NOTE — Telephone Encounter (Signed)
These blood pressure recordings are normal

## 2016-08-31 NOTE — Telephone Encounter (Signed)
Will forward to Dr. Smith for review and advisement 

## 2016-08-31 NOTE — Telephone Encounter (Signed)
New message    Pt verbalized that she is calling to report the months blood pressure and heart rate   8-31 127/70  Hr 52        122-65  Hr 60  9-1   126/62  Hr 63        9-2  132/68   Hr 57  9-3   122/68   Hr 58      122/72   Hr   65  9-4    119/68   Hr 61      108/55   Hr  58  9-5    124/67   Hr   65     9-6   131/72      Hr  59      123/62   Hr 61  9-7    127/65    Hr   57      9-11     124/72     Hr  55       9-12    129/76   Hr 58      9-13   128/70    Hr   55  9-14    128/77   Hr  58  9-18   136/90     Hr   128     9-22  117/66    Hr 64

## 2016-09-03 NOTE — Telephone Encounter (Signed)
Spoke with pt and advised her that BP readings are normal.  Pt verbalized understanding.

## 2016-09-04 ENCOUNTER — Encounter: Payer: Self-pay | Admitting: Physical Therapy

## 2016-09-04 ENCOUNTER — Ambulatory Visit: Payer: PPO | Attending: Family Medicine | Admitting: Physical Therapy

## 2016-09-04 DIAGNOSIS — M5441 Lumbago with sciatica, right side: Secondary | ICD-10-CM | POA: Diagnosis not present

## 2016-09-04 DIAGNOSIS — M62830 Muscle spasm of back: Secondary | ICD-10-CM | POA: Insufficient documentation

## 2016-09-04 DIAGNOSIS — R262 Difficulty in walking, not elsewhere classified: Secondary | ICD-10-CM | POA: Diagnosis not present

## 2016-09-04 NOTE — Therapy (Signed)
[place] [address] [place], [place], [address] Phone: [phone]   Fax:  [phone]  Physical Therapy Treatment  Patient Details  Name: Katherine Powell MRN: [id number] Date of Birth: 1932-01-30 Referring Provider: Edwin Dada  Encounter Date: 09/04/2016      PT End of Session - 09/04/16 1209    Visit Number 2   Date for PT Re-Evaluation 10/29/16   PT Start Time 1145   PT Stop Time 1210   PT Time Calculation (min) 25 min      Past Medical History:  Diagnosis Date  . Anxiety   . Arrhythmia    atrial fibrillation/  on   . Arthritis   . Atrial fibrillation ([place])   . Breast cancer ([place]) 11/09/13   right upper outer  . Contact lens/glasses fitting    contact rt eye  . Hx of radiation therapy 12/15/13- 01/11/14   right breast 4250 cGy in 17 sessions, (hypo-fractionated), no boost  . Hypertension   . Postmenopausal   . Rosacea   . Seasonal allergies   . Sleep disturbance   . Thyroid disease   . Urinary incontinence     Past Surgical History:  Procedure Laterality Date  . ABDOMINAL HYSTERECTOMY  1973   endometriosis  . APPENDECTOMY  1954  . BACK SURGERY     lumb-2005  . BILATERAL OOPHORECTOMY     benign tumor on ovary  . BREAST LUMPECTOMY WITH NEEDLE LOCALIZATION Right 11/09/2013   Procedure: BREAST LUMPECTOMY WITH NEEDLE LOCALIZATION;  Surgeon: Harl Bowie, MD;  Location: [facility];  Service: General;  Laterality: Right;  . CATARACT EXTRACTION  2013   both  . CYSTOCELE REPAIR  2008  . EYE SURGERY     cataracts-plugs for eyes  . RECTOCELE REPAIR  2008  . TONSILLECTOMY      There were no vitals filed for this visit.      Subjective Assessment - 09/04/16 1148    Subjective went Y this morning to walk-back hurts so only did UE. NE with stretches   Pain Score 3    Pain Location Back                         [facility] Adult PT Treatment/Exercise - 09/04/16  0001      Self-Care   Self-Care --  LOG ROLLING     Exercises   Exercises Lumbar     Lumbar Exercises: Standing   Other Standing Lumbar Exercises REIS- decreased pain     Lumbar Exercises: Seated   Other Seated Lumbar Exercises pelvic ROM on sit fit 10 times  on sit fit LAQ,marching and hip abd 10 reps each     Lumbar Exercises: Supine   Bridge 5 reps  with ball-stopped d/t apin   Other Supine Lumbar Exercises KTC with ball 8 times  stopped d/t pain     Manual Therapy   Manual Therapy Joint mobilization;Manual Traction   Joint Mobilization checked SI = alignment   Manual Traction sheet traction increased pain                PT Education - 09/04/16 1208    Education provided Yes   Education Details LOG ROLLING for back safety/support. Ext in standing decreased pain temporarily ( unable to do prone)   Person(s) Educated Patient   Methods Explanation;Demonstration;Handout   Comprehension Verbalized understanding;Returned demonstration  PT Short Term Goals - 09/04/16 1211      PT SHORT TERM GOAL #1   Title independent with initial HEP   Status Achieved           PT Long Term Goals - 08/29/16 1614      PT LONG TERM GOAL #1   Title decrease pain 50%   Time 8   Period Weeks   Status New     PT LONG TERM GOAL #2   Title increase lumbar ROM 25%   Time 8   Period Weeks   Status New     PT LONG TERM GOAL #3   Title go up and down stairs without difficulty   Time 8   Period Weeks   Status New     PT LONG TERM GOAL #4   Title report able to return to yoga without difficulty   Time 8   Period Weeks   Status New               Plan - 09/04/16 1209    Clinical Impression Statement pt reports MRI  denied- working on why. No SI issues noted. Increased pain with manual traction but responded well to ext so issued REIS. Pain with all ex and limited tolerance. Educa on log rolling to avoid stress on back. Pt declined modalites   PT  Next Visit Plan Asses Ext ex and progress as tolerated.      Patient will benefit from skilled therapeutic intervention in order to improve the following deficits and impairments:  Decreased activity tolerance, Decreased mobility, Decreased strength, Decreased range of motion, Difficulty walking, Impaired flexibility, Impaired perceived functional ability, Increased muscle spasms, Improper body mechanics, Pain  Visit Diagnosis: Acute right-sided low back pain with right-sided sciatica  Difficulty in walking, not elsewhere classified  Muscle spasm of back     Problem List Patient Active Problem List   Diagnosis Date Noted  . Osteopenia 07/18/2016  . Chronic anticoagulation 12/29/2014  . Encounter for monitoring flecainide therapy 12/29/2014  . Paroxysmal atrial fibrillation ([place]) 12/29/2014  . Postmenopausal estrogen deficiency 01/27/2014  . Hot flashes, menopausal 01/27/2014  . Breast cancer of upper-outer quadrant of right female breast ([place]) 11/17/2013    Traci Plemons,ANGIE PTA 09/04/2016, 12:12 PM  [place] [address] [place], [place], [address] Phone: [phone]   Fax:  [phone]  Name: Katherine Powell MRN: [id number] Date of Birth: 12/08/1931

## 2016-09-06 ENCOUNTER — Other Ambulatory Visit: Payer: Self-pay | Admitting: Family Medicine

## 2016-09-06 DIAGNOSIS — M5136 Other intervertebral disc degeneration, lumbar region: Secondary | ICD-10-CM

## 2016-09-11 ENCOUNTER — Encounter: Payer: Self-pay | Admitting: Physical Therapy

## 2016-09-11 ENCOUNTER — Ambulatory Visit: Payer: PPO | Admitting: Physical Therapy

## 2016-09-11 DIAGNOSIS — M5441 Lumbago with sciatica, right side: Secondary | ICD-10-CM

## 2016-09-11 DIAGNOSIS — R262 Difficulty in walking, not elsewhere classified: Secondary | ICD-10-CM

## 2016-09-11 DIAGNOSIS — M62830 Muscle spasm of back: Secondary | ICD-10-CM

## 2016-09-11 NOTE — Therapy (Signed)
[place] [address] [place], [place], [address] Phone: [phone]   Fax:  [phone]  Physical Therapy Treatment  Patient Details  Name: Katherine Powell MRN: [id number] Date of Birth: [birth year] Referring Provider: Edwin Dada  Encounter Date: 09/11/2016      PT End of Session - 09/11/16 1347    Visit Number 3   Date for PT Re-Evaluation 10/29/16   PT Start Time 1310   PT Stop Time 1356   PT Time Calculation (min) 46 min   Activity Tolerance Patient tolerated treatment well   Behavior During Therapy [facility] for tasks assessed/performed      Past Medical History:  Diagnosis Date  . Anxiety   . Arrhythmia    atrial fibrillation/  on   . Arthritis   . Atrial fibrillation ([place])   . Breast cancer ([place]) 11/09/13   right upper outer  . Contact lens/glasses fitting    contact rt eye  . Hx of radiation therapy 12/15/13- 01/11/14   right breast 4250 cGy in 17 sessions, (hypo-fractionated), no boost  . Hypertension   . Postmenopausal   . Rosacea   . Seasonal allergies   . Sleep disturbance   . Thyroid disease   . Urinary incontinence     Past Surgical History:  Procedure Laterality Date  . ABDOMINAL HYSTERECTOMY  1973   endometriosis  . APPENDECTOMY  1954  . BACK SURGERY     lumb-2005  . BILATERAL OOPHORECTOMY     benign tumor on ovary  . BREAST LUMPECTOMY WITH NEEDLE LOCALIZATION Right 11/09/2013   Procedure: BREAST LUMPECTOMY WITH NEEDLE LOCALIZATION;  Surgeon: Harl Bowie, MD;  Location: [facility];  Service: General;  Laterality: Right;  . CATARACT EXTRACTION  2013   both  . CYSTOCELE REPAIR  2008  . EYE SURGERY     cataracts-plugs for eyes  . RECTOCELE REPAIR  2008  . TONSILLECTOMY      There were no vitals filed for this visit.      Subjective Assessment - 09/11/16 1338    Subjective Patient reports that not getting a lot of carry over, feels good for a while, reports  that she is doing housework and that increases the pain.  Will have MRI next week   Currently in Pain? Yes   Pain Score 4    Pain Location Back   Pain Orientation Lower   Pain Descriptors / Indicators Aching   Aggravating Factors  housework                         [facility] Adult PT Treatment/Exercise - 09/11/16 0001      Self-Care   Self-Care ADL's   ADL's Went over proper body mechanics for ADL's and housecleaning, PT demo, patient verbalized understanding but also reports that she cannot always do it right or does not remember     Acupuncturist Location L/S area   Electrical Stimulation Action IFC   Electrical Stimulation Parameters sitting   Electrical Stimulation Goals Pain     Ultrasound   Ultrasound Location L/s area   Ultrasound Parameters 100% 1.5w/cm2   Ultrasound Goals Pain                  PT Short Term Goals - 09/04/16 1211      PT SHORT TERM GOAL #1   Title independent with initial HEP  Status Achieved           PT Long Term Goals - 09/11/16 1349      PT LONG TERM GOAL #1   Title decrease pain 50%   Status On-going     PT LONG TERM GOAL #2   Title increase lumbar ROM 25%   Status On-going               Plan - 09/11/16 1348    Clinical Impression Statement Patient reports that she has been cleaning a lot and is hurting, she reports that she tries to watch her mechanics.  She will have MRI next week   PT Next Visit Plan Hold treatment until after the MRI   Consulted and Agree with Plan of Care Patient      Patient will benefit from skilled therapeutic intervention in order to improve the following deficits and impairments:  Decreased activity tolerance, Decreased mobility, Decreased strength, Decreased range of motion, Difficulty walking, Impaired flexibility, Impaired perceived functional ability, Increased muscle spasms, Improper body mechanics, Pain  Visit Diagnosis: Acute  right-sided low back pain with right-sided sciatica  Difficulty in walking, not elsewhere classified  Muscle spasm of back     Problem List Patient Active Problem List   Diagnosis Date Noted  . Osteopenia 07/18/2016  . Chronic anticoagulation 12/29/2014  . Encounter for monitoring flecainide therapy 12/29/2014  . Paroxysmal atrial fibrillation ([place]) 12/29/2014  . Postmenopausal estrogen deficiency 01/27/2014  . Hot flashes, menopausal 01/27/2014  . Breast cancer of upper-outer quadrant of right female breast ([place]) 11/17/2013    [name], PT 09/11/2016, 1:50 PM  [place] [address] [place], [place], [address] Phone: [phone]   Fax:  [phone]  Name: Katherine Powell MRN: [id number] Date of Birth: 23-Jun-1932

## 2016-09-13 ENCOUNTER — Ambulatory Visit: Payer: PPO | Admitting: Physical Therapy

## 2016-09-16 ENCOUNTER — Ambulatory Visit
Admission: RE | Admit: 2016-09-16 | Discharge: 2016-09-16 | Disposition: A | Discharge status: Home / Self Care | Payer: PPO | Source: Ambulatory Visit | Attending: Family Medicine | Admitting: Family Medicine

## 2016-09-16 DIAGNOSIS — M5136 Other intervertebral disc degeneration, lumbar region: Secondary | ICD-10-CM

## 2016-09-16 DIAGNOSIS — M5126 Other intervertebral disc displacement, lumbar region: Secondary | ICD-10-CM | POA: Diagnosis not present

## 2016-09-19 ENCOUNTER — Other Ambulatory Visit: Payer: PPO

## 2016-09-26 ENCOUNTER — Other Ambulatory Visit: Payer: Self-pay | Admitting: Oncology

## 2016-09-26 DIAGNOSIS — C50411 Malignant neoplasm of upper-outer quadrant of right female breast: Secondary | ICD-10-CM

## 2016-10-11 DIAGNOSIS — M4310 Spondylolisthesis, site unspecified: Secondary | ICD-10-CM | POA: Diagnosis not present

## 2016-10-15 ENCOUNTER — Other Ambulatory Visit: Payer: Self-pay | Admitting: *Deleted

## 2016-10-15 ENCOUNTER — Telehealth: Payer: Self-pay | Admitting: Interventional Cardiology

## 2016-10-15 DIAGNOSIS — R413 Other amnesia: Secondary | ICD-10-CM | POA: Diagnosis not present

## 2016-10-15 DIAGNOSIS — C50911 Malignant neoplasm of unspecified site of right female breast: Secondary | ICD-10-CM | POA: Diagnosis not present

## 2016-10-15 DIAGNOSIS — H9190 Unspecified hearing loss, unspecified ear: Secondary | ICD-10-CM | POA: Diagnosis not present

## 2016-10-15 DIAGNOSIS — M85852 Other specified disorders of bone density and structure, left thigh: Secondary | ICD-10-CM | POA: Diagnosis not present

## 2016-10-15 DIAGNOSIS — Z7189 Other specified counseling: Secondary | ICD-10-CM | POA: Diagnosis not present

## 2016-10-15 DIAGNOSIS — Z79899 Other long term (current) drug therapy: Secondary | ICD-10-CM | POA: Diagnosis not present

## 2016-10-15 DIAGNOSIS — I10 Essential (primary) hypertension: Secondary | ICD-10-CM | POA: Diagnosis not present

## 2016-10-15 DIAGNOSIS — N951 Menopausal and female climacteric states: Secondary | ICD-10-CM | POA: Diagnosis not present

## 2016-10-15 DIAGNOSIS — Z7901 Long term (current) use of anticoagulants: Secondary | ICD-10-CM | POA: Diagnosis not present

## 2016-10-15 DIAGNOSIS — N183 Chronic kidney disease, stage 3 (moderate): Secondary | ICD-10-CM | POA: Diagnosis not present

## 2016-10-15 DIAGNOSIS — I480 Paroxysmal atrial fibrillation: Secondary | ICD-10-CM | POA: Diagnosis not present

## 2016-10-15 DIAGNOSIS — Z0000 Encounter for general adult medical examination without abnormal findings: Secondary | ICD-10-CM | POA: Diagnosis not present

## 2016-10-15 NOTE — Telephone Encounter (Signed)
Request for surgical clearance:  1. What type of surgery is being performed? ESI Lumbar   2. When is this surgery scheduled? Pending   3. Are there any medications that need to be held prior to surgery and how long? Eliquis/Xarelto   4. Name of physician performing surgery? Dr. Brien Few   5. What is your office phone and fax number? Ph [phone] Fax [phone]  Spoke with pt and she states about a month ago Dr. Addison Lank changed pt's medication to Eliquis from Xarelto because she could no longer afford Xarelto.  Pt currently taking Eliquis 5mg  BID. Pt does plan to stay on Eliquis permanently and not switch back to Xarelto.

## 2016-10-16 NOTE — Telephone Encounter (Signed)
Okay to hold Eliquis 3-4 days prior to surgery procedure.

## 2016-10-17 DIAGNOSIS — N951 Menopausal and female climacteric states: Secondary | ICD-10-CM | POA: Diagnosis not present

## 2016-10-17 DIAGNOSIS — F419 Anxiety disorder, unspecified: Secondary | ICD-10-CM | POA: Diagnosis not present

## 2016-10-17 DIAGNOSIS — Z5181 Encounter for therapeutic drug level monitoring: Secondary | ICD-10-CM | POA: Diagnosis not present

## 2016-10-17 DIAGNOSIS — I10 Essential (primary) hypertension: Secondary | ICD-10-CM | POA: Diagnosis not present

## 2016-10-17 DIAGNOSIS — Z7189 Other specified counseling: Secondary | ICD-10-CM | POA: Diagnosis not present

## 2016-10-17 DIAGNOSIS — Z0000 Encounter for general adult medical examination without abnormal findings: Secondary | ICD-10-CM | POA: Diagnosis not present

## 2016-10-17 DIAGNOSIS — M85851 Other specified disorders of bone density and structure, right thigh: Secondary | ICD-10-CM | POA: Diagnosis not present

## 2016-10-17 DIAGNOSIS — Z1389 Encounter for screening for other disorder: Secondary | ICD-10-CM | POA: Diagnosis not present

## 2016-10-17 DIAGNOSIS — I480 Paroxysmal atrial fibrillation: Secondary | ICD-10-CM | POA: Diagnosis not present

## 2016-10-17 DIAGNOSIS — M5136 Other intervertebral disc degeneration, lumbar region: Secondary | ICD-10-CM | POA: Diagnosis not present

## 2016-10-17 DIAGNOSIS — N183 Chronic kidney disease, stage 3 (moderate): Secondary | ICD-10-CM | POA: Diagnosis not present

## 2016-10-17 DIAGNOSIS — C50911 Malignant neoplasm of unspecified site of right female breast: Secondary | ICD-10-CM | POA: Diagnosis not present

## 2016-10-17 NOTE — Telephone Encounter (Signed)
Follow Up:    Pt had to cancel her injection today,because clearance was not received at Dr Brien Few office. Would you please fax this asap.

## 2016-10-17 NOTE — Telephone Encounter (Signed)
Clearance faxed to Dr. Lauris Poag office.  Spoke with pt and advised her that I have sent clearance. Pt appreciative for assistance.

## 2016-10-31 DIAGNOSIS — R030 Elevated blood-pressure reading, without diagnosis of hypertension: Secondary | ICD-10-CM | POA: Diagnosis not present

## 2016-10-31 DIAGNOSIS — M48061 Spinal stenosis, lumbar region without neurogenic claudication: Secondary | ICD-10-CM | POA: Insufficient documentation

## 2016-10-31 DIAGNOSIS — M5416 Radiculopathy, lumbar region: Secondary | ICD-10-CM | POA: Diagnosis not present

## 2016-10-31 DIAGNOSIS — Z6825 Body mass index (BMI) 25.0-25.9, adult: Secondary | ICD-10-CM | POA: Diagnosis not present

## 2016-10-31 DIAGNOSIS — M9983 Other biomechanical lesions of lumbar region: Secondary | ICD-10-CM | POA: Diagnosis not present

## 2016-11-01 DIAGNOSIS — M5416 Radiculopathy, lumbar region: Secondary | ICD-10-CM | POA: Diagnosis not present

## 2016-11-01 DIAGNOSIS — M9983 Other biomechanical lesions of lumbar region: Secondary | ICD-10-CM | POA: Diagnosis not present

## 2016-11-21 ENCOUNTER — Other Ambulatory Visit: Payer: Self-pay | Admitting: Oncology

## 2016-11-21 DIAGNOSIS — J101 Influenza due to other identified influenza virus with other respiratory manifestations: Secondary | ICD-10-CM | POA: Diagnosis not present

## 2016-11-21 DIAGNOSIS — R509 Fever, unspecified: Secondary | ICD-10-CM | POA: Diagnosis not present

## 2016-11-29 DIAGNOSIS — J329 Chronic sinusitis, unspecified: Secondary | ICD-10-CM | POA: Diagnosis not present

## 2016-11-29 DIAGNOSIS — J209 Acute bronchitis, unspecified: Secondary | ICD-10-CM | POA: Diagnosis not present

## 2016-12-18 DIAGNOSIS — Z853 Personal history of malignant neoplasm of breast: Secondary | ICD-10-CM | POA: Diagnosis not present

## 2016-12-20 ENCOUNTER — Other Ambulatory Visit: Payer: Self-pay | Admitting: Interventional Cardiology

## 2016-12-20 ENCOUNTER — Other Ambulatory Visit: Payer: Self-pay | Admitting: Oncology

## 2016-12-20 DIAGNOSIS — C50411 Malignant neoplasm of upper-outer quadrant of right female breast: Secondary | ICD-10-CM

## 2016-12-21 ENCOUNTER — Other Ambulatory Visit: Payer: Self-pay | Admitting: *Deleted

## 2016-12-21 MED ORDER — CLONIDINE HCL 0.1 MG PO TABS: 0.1000 mg | ORAL_TABLET | Freq: Two times a day (BID) | ORAL | 0 refills | Status: DC | PRN

## 2016-12-21 NOTE — Telephone Encounter (Signed)
Please review for refill.  

## 2016-12-31 ENCOUNTER — Telehealth: Payer: Self-pay | Admitting: Oncology

## 2016-12-31 NOTE — Telephone Encounter (Signed)
left voicemail to advise patient that appointment time for 01/23/17 has changed from 11:00am to 3:15am

## 2017-01-11 ENCOUNTER — Other Ambulatory Visit: Payer: Self-pay | Admitting: *Deleted

## 2017-01-11 DIAGNOSIS — C50411 Malignant neoplasm of upper-outer quadrant of right female breast: Secondary | ICD-10-CM

## 2017-01-14 ENCOUNTER — Other Ambulatory Visit (HOSPITAL_BASED_OUTPATIENT_CLINIC_OR_DEPARTMENT_OTHER): Payer: PPO

## 2017-01-14 DIAGNOSIS — C50411 Malignant neoplasm of upper-outer quadrant of right female breast: Secondary | ICD-10-CM

## 2017-01-14 LAB — CBC WITH DIFFERENTIAL/PLATELET
BASO%: 0.3 % (ref 0.0–2.0)
Basophils Absolute: 0 10*3/uL (ref 0.0–0.1)
EOS%: 2 % (ref 0.0–7.0)
Eosinophils Absolute: 0.1 10*3/uL (ref 0.0–0.5)
HCT: 34.7 % — ABNORMAL LOW (ref 34.8–46.6)
HGB: 11.8 g/dL (ref 11.6–15.9)
LYMPH%: 23.4 % (ref 14.0–49.7)
MCH: 32 pg (ref 25.1–34.0)
MCHC: 34 g/dL (ref 31.5–36.0)
MCV: 94 fL (ref 79.5–101.0)
MONO#: 0.5 10*3/uL (ref 0.1–0.9)
MONO%: 7 % (ref 0.0–14.0)
NEUT#: 4.4 10*3/uL (ref 1.5–6.5)
NEUT%: 67.3 % (ref 38.4–76.8)
Platelets: 196 10*3/uL (ref 145–400)
RBC: 3.69 10*6/uL — ABNORMAL LOW (ref 3.70–5.45)
RDW: 14.1 % (ref 11.2–14.5)
WBC: 6.5 10*3/uL (ref 3.9–10.3)
lymph#: 1.5 10*3/uL (ref 0.9–3.3)

## 2017-01-14 LAB — COMPREHENSIVE METABOLIC PANEL
ALT: 11 U/L (ref 0–55)
AST: 17 U/L (ref 5–34)
Albumin: 3.8 g/dL (ref 3.5–5.0)
Alkaline Phosphatase: 50 U/L (ref 40–150)
Anion Gap: 5 mEq/L (ref 3–11)
BUN: 20 mg/dL (ref 7.0–26.0)
CO2: 27 mEq/L (ref 22–29)
Calcium: 9.3 mg/dL (ref 8.4–10.4)
Chloride: 104 mEq/L (ref 98–109)
Creatinine: 1 mg/dL (ref 0.6–1.1)
EGFR: 56 mL/min/{1.73_m2} — ABNORMAL LOW (ref >90)
Glucose: 106 mg/dl (ref 70–140)
Potassium: 4.1 mEq/L (ref 3.5–5.1)
Sodium: 137 mEq/L (ref 136–145)
Total Bilirubin: 0.42 mg/dL (ref 0.20–1.20)
Total Protein: 6.3 g/dL — ABNORMAL LOW (ref 6.4–8.3)

## 2017-01-17 ENCOUNTER — Emergency Department (HOSPITAL_COMMUNITY)
Admission: EM | Admit: 2017-01-17 | Discharge: 2017-01-17 | Disposition: A | Discharge status: Home / Self Care | Payer: PPO | Attending: Physician Assistant | Admitting: Physician Assistant

## 2017-01-17 ENCOUNTER — Encounter (HOSPITAL_COMMUNITY): Payer: Self-pay | Admitting: Emergency Medicine

## 2017-01-17 ENCOUNTER — Emergency Department (HOSPITAL_COMMUNITY): Payer: PPO

## 2017-01-17 DIAGNOSIS — I10 Essential (primary) hypertension: Secondary | ICD-10-CM | POA: Diagnosis not present

## 2017-01-17 DIAGNOSIS — R1012 Left upper quadrant pain: Secondary | ICD-10-CM | POA: Diagnosis not present

## 2017-01-17 DIAGNOSIS — A084 Viral intestinal infection, unspecified: Secondary | ICD-10-CM | POA: Diagnosis not present

## 2017-01-17 DIAGNOSIS — Z853 Personal history of malignant neoplasm of breast: Secondary | ICD-10-CM | POA: Diagnosis not present

## 2017-01-17 DIAGNOSIS — R109 Unspecified abdominal pain: Secondary | ICD-10-CM | POA: Diagnosis not present

## 2017-01-17 DIAGNOSIS — R1011 Right upper quadrant pain: Secondary | ICD-10-CM | POA: Insufficient documentation

## 2017-01-17 DIAGNOSIS — R197 Diarrhea, unspecified: Secondary | ICD-10-CM | POA: Diagnosis not present

## 2017-01-17 DIAGNOSIS — Z7901 Long term (current) use of anticoagulants: Secondary | ICD-10-CM | POA: Insufficient documentation

## 2017-01-17 LAB — URINALYSIS, ROUTINE W REFLEX MICROSCOPIC
Bilirubin Urine: NEGATIVE
Glucose, UA: NEGATIVE mg/dL
Hgb urine dipstick: NEGATIVE
Ketones, ur: 5 mg/dL — AB
Leukocytes, UA: NEGATIVE
Nitrite: NEGATIVE
Protein, ur: NEGATIVE mg/dL
Specific Gravity, Urine: 1.005 (ref 1.005–1.030)
pH: 5 (ref 5.0–8.0)

## 2017-01-17 LAB — COMPREHENSIVE METABOLIC PANEL
ALT: 20 U/L (ref 14–54)
AST: 28 U/L (ref 15–41)
Albumin: 4.3 g/dL (ref 3.5–5.0)
Alkaline Phosphatase: 44 U/L (ref 38–126)
Anion gap: 8 (ref 5–15)
BUN: 12 mg/dL (ref 6–20)
CO2: 23 mmol/L (ref 22–32)
Calcium: 8.3 mg/dL — ABNORMAL LOW (ref 8.9–10.3)
Chloride: 103 mmol/L (ref 101–111)
Creatinine, Ser: 0.85 mg/dL (ref 0.44–1.00)
GFR calc Af Amer: >60 mL/min (ref >60)
GFR calc non Af Amer: >60 mL/min (ref >60)
Glucose, Bld: 101 mg/dL — ABNORMAL HIGH (ref 65–99)
Potassium: 4.1 mmol/L (ref 3.5–5.1)
Sodium: 134 mmol/L — ABNORMAL LOW (ref 135–145)
Total Bilirubin: 1.2 mg/dL (ref 0.3–1.2)
Total Protein: 6.8 g/dL (ref 6.5–8.1)

## 2017-01-17 LAB — CBC
HCT: 33.2 % — ABNORMAL LOW (ref 36.0–46.0)
Hemoglobin: 11.3 g/dL — ABNORMAL LOW (ref 12.0–15.0)
MCH: 31.6 pg (ref 26.0–34.0)
MCHC: 34 g/dL (ref 30.0–36.0)
MCV: 92.7 fL (ref 78.0–100.0)
Platelets: 173 10*3/uL (ref 150–400)
RBC: 3.58 MIL/uL — ABNORMAL LOW (ref 3.87–5.11)
RDW: 14 % (ref 11.5–15.5)
WBC: 4.3 10*3/uL (ref 4.0–10.5)

## 2017-01-17 LAB — LIPASE, BLOOD: Lipase: 37 U/L (ref 11–51)

## 2017-01-17 MED ORDER — IOPAMIDOL (ISOVUE-300) INJECTION 61%
75.0000 mL | Freq: Once | INTRAVENOUS | Status: AC | PRN
  Administered 2017-01-17: 75 mL via INTRAVENOUS

## 2017-01-17 MED ORDER — ONDANSETRON HCL 4 MG PO TABS: 4.0000 mg | ORAL_TABLET | Freq: Three times a day (TID) | ORAL | 0 refills | Status: DC | PRN

## 2017-01-17 MED ORDER — IOPAMIDOL (ISOVUE-300) INJECTION 61%
INTRAVENOUS | Status: AC
  Filled 2017-01-17: qty 100

## 2017-01-17 MED ORDER — SODIUM CHLORIDE 0.9 % IJ SOLN
INTRAMUSCULAR | Status: AC
  Filled 2017-01-17: qty 50

## 2017-01-17 NOTE — ED Triage Notes (Signed)
Pt was sent from [facility] walk in clinic for further evaluation. Pt c/o upper right abdominal pain onset today but diarrhea & nausea since Tuesday. Pt feels that she has gas, guarding of abdomen. Pt had CBC and dg of abdemon done. Xray just showed gas. Sent for CT.

## 2017-01-17 NOTE — ED Provider Notes (Signed)
[facility] DEPT Provider Note   CSN: [id number] Arrival date & time: 01/17/17  1612     History   Chief Complaint Chief Complaint  Patient presents with  . Abdominal Pain    HPI Katherine Powell is a 83 y.o. female.  HPI   e is a 83 year old female with history of anxiety A. fib breast cancer hypertension presenting today with several days of nausea vomiting diarrhea. This started 2 days ago. She had one episode of vomiting followed by diarrhea. Patient had another episode of vomiting earlier today before her primary care physician. Patient primary care physician today KUB which was normal and sent her here for a CAT scan. Patient's been afebrile and otherwise does not have any symptoms.  The pain is "pangs", mostly in the right upper quadrant and then also the left upper quadrant. Patient feels like it's gas pains. Patient's noted no blood in her diarrhea or vomit. Patient's been able to stay hydrated by taking sips of water.  Past Medical History:  Diagnosis Date  . Anxiety   . Arrhythmia    atrial fibrillation/  on   . Arthritis   . Atrial fibrillation ([place])   . Breast cancer ([place]) 11/09/13   right upper outer  . Contact lens/glasses fitting    contact rt eye  . Hx of radiation therapy 12/15/13- 01/11/14   right breast 4250 cGy in 17 sessions, (hypo-fractionated), no boost  . Hypertension   . Postmenopausal   . Rosacea   . Seasonal allergies   . Sleep disturbance   . Thyroid disease   . Urinary incontinence     Patient Active Problem List   Diagnosis Date Noted  . Osteopenia 07/18/2016  . Chronic anticoagulation 12/29/2014  . Encounter for monitoring flecainide therapy 12/29/2014  . Paroxysmal atrial fibrillation ([place]) 12/29/2014  . Postmenopausal estrogen deficiency 01/27/2014  . Hot flashes, menopausal 01/27/2014  . Breast cancer of upper-outer quadrant of right female breast ([place]) 11/17/2013    Past Surgical History:  Procedure Laterality Date  .  ABDOMINAL HYSTERECTOMY  1973   endometriosis  . APPENDECTOMY  1954  . BACK SURGERY     lumb-2005  . BILATERAL OOPHORECTOMY     benign tumor on ovary  . BREAST LUMPECTOMY WITH NEEDLE LOCALIZATION Right 11/09/2013   Procedure: BREAST LUMPECTOMY WITH NEEDLE LOCALIZATION;  Surgeon: Harl Bowie, MD;  Location: [facility];  Service: General;  Laterality: Right;  . CATARACT EXTRACTION  2013   both  . CYSTOCELE REPAIR  2008  . EYE SURGERY     cataracts-plugs for eyes  . RECTOCELE REPAIR  2008  . TONSILLECTOMY      OB History    No data available       Home Medications    Prior to Admission medications   Medication Sig Start Date End Date Taking? Authorizing Provider  ALPRAZolam Duanne Moron) 0.5 MG tablet Take 0.5 mg by mouth at bedtime as needed for anxiety.   Yes Historical Provider, MD  anastrozole (ARIMIDEX) 1 MG tablet TAKE 1 TABLET BY MOUTH DAILY 12/21/16  Yes Chauncey Cruel, MD  apixaban (ELIQUIS) 5 MG TABS tablet Take 1 tablet (5 mg total) by mouth 2 (two) times daily. 10/15/16  Yes Belva Crome, MD  Calcium-Phosphorus-Vitamin D (CITRACAL +D3 PO) Take 1 tablet by mouth daily.   Yes Historical Provider, MD  CARTIA XT 120 MG 24 hr capsule Take 120 mg by mouth every evening.  01/09/14  Yes Historical Provider,  MD  cholecalciferol (VITAMIN D) 400 UNITS TABS tablet Take 400 Units by mouth daily.    Yes Historical Provider, MD  cycloSPORINE (RESTASIS) 0.05 % ophthalmic emulsion Place 1 drop into both eyes 2 (two) times daily.    Yes Historical Provider, MD  flecainide (TAMBOCOR) 100 MG tablet Take 1 tablet (100 mg total) by mouth 2 (two) times daily. 12/21/16  Yes Belva Crome, MD  glucosamine-chondroitin 500-400 MG tablet Take 1 tablet by mouth 2 (two) times daily.   Yes Historical Provider, MD  Multiple Vitamins-Minerals (PRESERVISION AREDS 2 PO) Take 1 capsule by mouth 2 (two) times daily.   Yes Historical Provider, MD  Omega-3 Fatty Acids (EQL OMEGA 3 FISH OIL)  1400 MG CAPS Take 1 capsule by mouth daily.   Yes Historical Provider, MD  acetaminophen (TYLENOL) 650 MG CR tablet Take 650 mg by mouth every 8 (eight) hours as needed for pain. Reported on 01/19/2016    Historical Provider, MD  cloNIDine (CATAPRES) 0.1 MG tablet Take 1 tablet (0.1 mg total) by mouth 2 (two) times daily as needed. Patient taking differently: Take 0.1 mg by mouth 2 (two) times daily as needed (hot flashes).  12/21/16   Chauncey Cruel, MD  fexofenadine (ALLEGRA) 180 MG tablet Take 180 mg by mouth continuous as needed for allergies or rhinitis.    Historical Provider, MD  fluticasone (FLONASE) 50 MCG/ACT nasal spray Place 1 spray into both nostrils daily as needed for allergies. Reported on 01/19/2016    Historical Provider, MD  ondansetron (ZOFRAN) 4 MG tablet Take 1 tablet (4 mg total) by mouth every 8 (eight) hours as needed for nausea or vomiting. 01/17/17   Markevion Lattin Lyn Tunisia Landgrebe, MD  Risedronate Sodium (ACTONEL PO) Take 1 tablet by mouth daily.     Historical Provider, MD  sodium chloride (OCEAN) 0.65 % nasal spray Place 1 spray into the nose as needed for congestion. Reported on 01/19/2016    Historical Provider, MD  trolamine salicylate (ASPERCREME) 10 % cream Apply 1 application topically as needed for muscle pain.    Historical Provider, MD    Family History Family History  Problem Relation Age of Onset  . Alzheimer's disease Mother   . Stroke Father   . Heart attack Neg Hx     Social History Social History  Substance Use Topics  . Smoking status: Never Smoker  . Smokeless tobacco: Never Used  . Alcohol use No     Allergies   Codeine and Gabapentin   Review of Systems Review of Systems  Constitutional: Negative for activity change.  Respiratory: Negative for shortness of breath.   Cardiovascular: Negative for chest pain.  Gastrointestinal: Positive for abdominal pain, diarrhea and vomiting.     Physical Exam Updated Vital Signs BP 119/64 (BP Location:  Right Arm)   Pulse 66   Temp 98.2 F (36.8 C) (Oral)   Resp 18   Ht 5' 7.5" (1.715 m)   Wt 154 lb (69.9 kg)   SpO2 99%   BMI 23.76 kg/m   Physical Exam  Constitutional: She is oriented to person, place, and time. She appears well-developed and well-nourished.  HENT:  Head: Normocephalic and atraumatic.  Eyes: Right eye exhibits no discharge. Left eye exhibits no discharge.  Cardiovascular: Normal rate and regular rhythm.   No murmur heard. Pulmonary/Chest: Effort normal and breath sounds normal. No respiratory distress.  Abdominal: Soft. Bowel sounds are normal. She exhibits no mass. There is no tenderness. There is no rebound  and no guarding.  Neurological: She is oriented to person, place, and time.  Skin: Skin is warm and dry. She is not diaphoretic.  Psychiatric: She has a normal mood and affect.  Nursing note and vitals reviewed.    ED Treatments / Results  Labs (all labs ordered are listed, but only abnormal results are displayed) Labs Reviewed  COMPREHENSIVE METABOLIC PANEL - Abnormal; Notable for the following:       Result Value   Sodium 134 (*)    Glucose, Bld 101 (*)    Calcium 8.3 (*)    All other components within normal limits  CBC - Abnormal; Notable for the following:    RBC 3.58 (*)    Hemoglobin 11.3 (*)    HCT 33.2 (*)    All other components within normal limits  URINALYSIS, ROUTINE W REFLEX MICROSCOPIC - Abnormal; Notable for the following:    Color, Urine STRAW (*)    Ketones, ur 5 (*)    All other components within normal limits  LIPASE, BLOOD    EKG  EKG Interpretation None       Radiology Ct Abdomen Pelvis W Contrast  Result Date: 01/17/2017 CLINICAL DATA:  Diarrhea, generalized abdominal pain. EXAM: CT ABDOMEN AND PELVIS WITH CONTRAST TECHNIQUE: Multidetector CT imaging of the abdomen and pelvis was performed using the standard protocol following bolus administration of intravenous contrast. CONTRAST:  11mL ISOVUE-300 IOPAMIDOL  (ISOVUE-300) INJECTION 61% COMPARISON:  None. FINDINGS: Lower chest: No acute abnormality. Hepatobiliary: No gallstones are noted. Multiple hepatic cysts of varying sizes are noted. Pancreas: Unremarkable. No pancreatic ductal dilatation or surrounding inflammatory changes. Spleen: Normal in size without focal abnormality. Adrenals/Urinary Tract: Adrenal glands are unremarkable. Kidneys are normal, without renal calculi, focal lesion, or hydronephrosis. Bladder is unremarkable. Stomach/Bowel: There is no evidence of bowel obstruction. Vascular/Lymphatic: Aortic atherosclerosis. No enlarged abdominal or pelvic lymph nodes. Reproductive: Status post hysterectomy. No adnexal masses. Other: No abdominal wall hernia or abnormality. No abdominopelvic ascites. Musculoskeletal: No acute or significant osseous findings. IMPRESSION: Aortic atherosclerosis. Hepatic cysts. No acute abnormality seen in the abdomen or pelvis. Electronically Signed   By: Marijo Conception, M.D.   On: 01/17/2017 20:36    Procedures Procedures (including critical care time)  Medications Ordered in ED Medications  iopamidol (ISOVUE-300) 61 % injection (not administered)  sodium chloride 0.9 % injection (not administered)  iopamidol (ISOVUE-300) 61 % injection 75 mL (75 mLs Intravenous Contrast Given 01/17/17 2015)     Initial Impression / Assessment and Plan / ED Course  I have reviewed the triage vital signs and the nursing notes.  Pertinent labs & imaging results that were available during my care of the patient were reviewed by me and considered in my medical decision making (see chart for details).     This 83 year old female with nausea vomiting diarrhea sent from her primary care for CAT scan. Patient's abdomen exam is currently benign. It is soft nontender. She has normal vital signs. Patient's been able to stay stay hydrated at home by drinking fluids. However given the fact she was sent here by PCP we will get the CAT  scan.  CT normal. VS reamined normal as are labs.  Likely viral GI.  Patient is comfortable, ambulatory, and taking PO at time of discharge.  Patient expressed understanding about return precautions.    Final Clinical Impressions(s) / ED Diagnoses   Final diagnoses:  Abdominal pain, unspecified abdominal location    New Prescriptions Discharge Medication List as  of 01/17/2017  9:46 PM    START taking these medications   Details  ondansetron (ZOFRAN) 4 MG tablet Take 1 tablet (4 mg total) by mouth every 8 (eight) hours as needed for nausea or vomiting., Starting Thu 01/17/2017, Print         Caleesi Kohl Julio Alm, MD 01/18/17 [id number]

## 2017-01-17 NOTE — Discharge Instructions (Signed)
YourCAT scan is normal and your able to eat and drink here in the emergency department. Please use Zofran as needed for nausea. Please return if he had any symptoms of dehydration or other concerns.

## 2017-01-23 ENCOUNTER — Ambulatory Visit (HOSPITAL_BASED_OUTPATIENT_CLINIC_OR_DEPARTMENT_OTHER): Payer: PPO | Admitting: Oncology

## 2017-01-23 ENCOUNTER — Ambulatory Visit (HOSPITAL_BASED_OUTPATIENT_CLINIC_OR_DEPARTMENT_OTHER): Payer: PPO

## 2017-01-23 ENCOUNTER — Other Ambulatory Visit: Payer: PPO

## 2017-01-23 VITALS — BP 123/60 | HR 68 | Temp 97.8°F | Resp 20

## 2017-01-23 VITALS — BP 125/57 | HR 57 | Temp 97.0°F | Resp 17 | Ht 67.5 in | Wt 152.2 lb

## 2017-01-23 DIAGNOSIS — M8580 Other specified disorders of bone density and structure, unspecified site: Secondary | ICD-10-CM

## 2017-01-23 DIAGNOSIS — E010 Iodine-deficiency related diffuse (endemic) goiter: Secondary | ICD-10-CM

## 2017-01-23 DIAGNOSIS — Z170 Estrogen receptor positive status [ER+]: Secondary | ICD-10-CM | POA: Diagnosis not present

## 2017-01-23 DIAGNOSIS — C50411 Malignant neoplasm of upper-outer quadrant of right female breast: Secondary | ICD-10-CM

## 2017-01-23 DIAGNOSIS — I4891 Unspecified atrial fibrillation: Secondary | ICD-10-CM

## 2017-01-23 DIAGNOSIS — Z7901 Long term (current) use of anticoagulants: Secondary | ICD-10-CM

## 2017-01-23 DIAGNOSIS — Z79811 Long term (current) use of aromatase inhibitors: Secondary | ICD-10-CM

## 2017-01-23 MED ORDER — DENOSUMAB 60 MG/ML ~~LOC~~ SOLN
60.0000 mg | Freq: Once | SUBCUTANEOUS | Status: AC
  Administered 2017-01-23: 60 mg via SUBCUTANEOUS
  Filled 2017-01-23: qty 1

## 2017-01-23 NOTE — Patient Instructions (Signed)
Denosumab injection  What is this medicine?  DENOSUMAB (den oh sue mab) slows bone breakdown. Prolia is used to treat osteoporosis in women after menopause and in men. Xgeva is used to prevent bone fractures and other bone problems caused by cancer bone metastases. Xgeva is also used to treat giant cell tumor of the bone.  This medicine may be used for other purposes; ask your health care provider or pharmacist if you have questions.  What should I tell my health care provider before I take this medicine?  They need to know if you have any of these conditions:  -dental disease  -eczema  -infection or history of infections  -kidney disease or on dialysis  -low blood calcium or vitamin D  -malabsorption syndrome  -scheduled to have surgery or tooth extraction  -taking medicine that contains denosumab  -thyroid or parathyroid disease  -an unusual reaction to denosumab, other medicines, foods, dyes, or preservatives  -pregnant or trying to get pregnant  -breast-feeding  How should I use this medicine?  This medicine is for injection under the skin. It is given by a health care professional in a hospital or clinic setting.  If you are getting Prolia, a special MedGuide will be given to you by the pharmacist with each prescription and refill. Be sure to read this information carefully each time.  For Prolia, talk to your pediatrician regarding the use of this medicine in children. Special care may be needed. For Xgeva, talk to your pediatrician regarding the use of this medicine in children. While this drug may be prescribed for children as young as 13 years for selected conditions, precautions do apply.  Overdosage: If you think you have taken too much of this medicine contact a poison control center or emergency room at once.  NOTE: This medicine is only for you. Do not share this medicine with others.  What if I miss a dose?  It is important not to miss your dose. Call your doctor or health care professional if you are  unable to keep an appointment.  What may interact with this medicine?  Do not take this medicine with any of the following medications:  -other medicines containing denosumab  This medicine may also interact with the following medications:  -medicines that suppress the immune system  -medicines that treat cancer  -steroid medicines like prednisone or cortisone  This list may not describe all possible interactions. Give your health care provider a list of all the medicines, herbs, non-prescription drugs, or dietary supplements you use. Also tell them if you smoke, drink alcohol, or use illegal drugs. Some items may interact with your medicine.  What should I watch for while using this medicine?  Visit your doctor or health care professional for regular checks on your progress. Your doctor or health care professional may order blood tests and other tests to see how you are doing.  Call your doctor or health care professional if you get a cold or other infection while receiving this medicine. Do not treat yourself. This medicine may decrease your body's ability to fight infection.  You should make sure you get enough calcium and vitamin D while you are taking this medicine, unless your doctor tells you not to. Discuss the foods you eat and the vitamins you take with your health care professional.  See your dentist regularly. Brush and floss your teeth as directed. Before you have any dental work done, tell your dentist you are receiving this medicine.  Do   not become pregnant while taking this medicine or for 5 months after stopping it. Women should inform their doctor if they wish to become pregnant or think they might be pregnant. There is a potential for serious side effects to an unborn child. Talk to your health care professional or pharmacist for more information.  What side effects may I notice from receiving this medicine?  Side effects that you should report to your doctor or health care professional as soon as  possible:  -allergic reactions like skin rash, itching or hives, swelling of the face, lips, or tongue  -breathing problems  -chest pain  -fast, irregular heartbeat  -feeling faint or lightheaded, falls  -fever, chills, or any other sign of infection  -muscle spasms, tightening, or twitches  -numbness or tingling  -skin blisters or bumps, or is dry, peels, or red  -slow healing or unexplained pain in the mouth or jaw  -unusual bleeding or bruising  Side effects that usually do not require medical attention (Report these to your doctor or health care professional if they continue or are bothersome.):  -muscle pain  -stomach upset, gas  This list may not describe all possible side effects. Call your doctor for medical advice about side effects. You may report side effects to FDA at 1-800-FDA-1088.  Where should I keep my medicine?  This medicine is only given in a clinic, doctor's office, or other health care setting and will not be stored at home.  NOTE: This sheet is a summary. It may not cover all possible information. If you have questions about this medicine, talk to your doctor, pharmacist, or health care provider.      2016, Elsevier/Gold Standard. (2012-05-19 12:37:47)

## 2017-01-23 NOTE — Progress Notes (Signed)
[place]  [phone] [phone]   ID: Katherine Powell OB: [birth year]  MR#: [id number]  IHK#:[id number]  PCP: Katherine Caraway, MD GYN:   SUCoralie Powell OTHER MD: Katherine Powell, Katherine Powell, Katherine Powell, Katherine Powell  CHIEF COMPLAINT: Estrogen receptor positive breast cancer  Current treatment: Anastrozole, denosumab/Prolia  HISTORY OF PRESENT ILLNESS: From the original intake note:  Katherine Powell had routine screening mammography at [facility] 10/14/2013 showing a 1 cm irregular mass in the right breast. Right digital mammography and ultrasonography of the right breast 10/21/2013 showed a mass with indistinct margins at the 12:00 position which by ultrasound measured 1 cm, and was lobulated. Biopsy was recommended, but the patient opted to proceed directly with lumpectomy, and this was performed 11/09/2013. The final pathology ([id number]) showed an invasive ductal carcinoma, grade 2, measuring 1.1 cm, with low-grade ductal carcinoma in situ. The tumor was estrogen receptor 100% positive, and progesterone receptor 94% positive, both with strong staining intensity. The MIB-1 was 10%. There was no HER-2 amplification with a ratio by CISH of 0.90, and the number per cell being 1.80.  The patient's subsequent history is as detailed below   INTERVAL HISTORY: Katherine Powell returns today for follow-up of her estrogen receptor positive breast cancer. She continues on anastrozole, with good tolerance. Hot flashes and vaginal dryness are not a major issue. She never developed the arthralgias or myalgias that many patients can experience on this medication. She obtains it at a good price.  Since her last visit here she has had a variety of problems evaluated and in particular her low back pain led to an MRI of the lumbar spine 09/16/2016 which showed significant osteoarthritis but no evidence of metastatic disease. More recently she had an episode of abdominal cramps and  diarrhea which took her to the emergency room. A CT of the abdomen and pelvis was obtained which was likewise unremarkable  REVIEW OF SYSTEMS: The diarrhea has resolved with no particular intervention. She is not really sure what caused this as there had been no dietary changes. She never had fever, aches and pains, or other associated symptoms with that problem. She continues to be very active, going to the gym at least twice a week and walking at least 3 additional days. She has noted that her lower left neck is asymmetric as compared to the right side and she wonders what we need to do about this. A detailed review of systems today was otherwise stable  PAST MEDICAL HISTORY: Past Medical History:  Diagnosis Date  . Anxiety   . Arrhythmia    atrial fibrillation/  on   . Arthritis   . Atrial fibrillation ([place])   . Breast cancer ([place]) 11/09/13   right upper outer  . Contact lens/glasses fitting    contact rt eye  . Hx of radiation therapy 12/15/13- 01/11/14   right breast 4250 cGy in 17 sessions, (hypo-fractionated), no boost  . Hypertension   . Postmenopausal   . Rosacea   . Seasonal allergies   . Sleep disturbance   . Thyroid disease   . Urinary incontinence     PAST SURGICAL HISTORY: Past Surgical History:  Procedure Laterality Date  . ABDOMINAL HYSTERECTOMY  1973   endometriosis  . APPENDECTOMY  1954  . BACK SURGERY     lumb-2005  . BILATERAL OOPHORECTOMY     benign tumor on ovary  . BREAST LUMPECTOMY WITH NEEDLE LOCALIZATION Right 11/09/2013   Procedure: BREAST LUMPECTOMY WITH NEEDLE LOCALIZATION;  Surgeon: Katherine Bowie, MD;  Location: [facility];  Service: General;  Laterality: Right;  . CATARACT EXTRACTION  2013   both  . CYSTOCELE REPAIR  2008  . EYE SURGERY     cataracts-plugs for eyes  . RECTOCELE REPAIR  2008  . TONSILLECTOMY      FAMILY HISTORY Family History  Problem Relation Age of Onset  . Alzheimer's disease Mother   . Stroke Father    . Heart attack Neg Hx    the patient's father died from COPD at age 24 in the setting of tobacco abuse. The patient's mother died at the age of 56 with Alzheimer's disease. The patient had no brothers, one sister. There is no history of breast or ovarian cancer in the family to her knowledge  GYNECOLOGIC HISTORY:  Menarche age 42, first live birth age 10. The patient is GX P3. She underwent simple hysterectomy in 1973 and took hormone replacement (initially Prempro later estrogens only) until December 2014. 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 Her husband Katherine Powell was an Chief Financial Officer for [employer]. He died 05-03-15 from prostate cancer.Their son Katherine Powell lives in [place]. where he works in Engineer, technical sales. Daughter Katherine Powell lives in [place] where she works as a Designer, jewellery in an independent clinic. Daughter Katherine Powell listened Katherine Powell. She works as a Tree surgeon. The patient has 7 grandchildren, 2 of whom are getting married in the winter of 2016. She is a Tourist information centre manager.    ADVANCED DIRECTIVES: In place; the patient has named her daughter, Katherine Powell, is a Designer, jewellery, as her healthcare power of attorney. And can be reached at [phone].   HEALTH MAINTENANCE: Social History  Substance Use Topics  . Smoking status: Never Smoker  . Smokeless tobacco: Never Used  . Alcohol use No     Colonoscopy:  PAP:  Bone density: At [facility] 09/27/2010, normal  Lipid panel:  Allergies  Allergen Reactions  . Codeine Nausea Only  . Gabapentin Other (See Comments)    Makes her very sleepy    Current Outpatient Prescriptions  Medication Sig Dispense Refill  . acetaminophen (TYLENOL) 650 MG CR tablet Take 650 mg by mouth every 8 (eight) hours as needed for pain. Reported on 01/19/2016    .  ALPRAZolam (XANAX) 0.5 MG tablet Take 0.5 mg by mouth at bedtime as needed for anxiety.    [name] anastrozole (ARIMIDEX) 1 MG tablet TAKE 1 TABLET BY MOUTH DAILY 90 tablet 0  . apixaban (ELIQUIS) 5 MG TABS tablet Take 1 tablet (5 mg total) by mouth 2 (two) times daily. 60 tablet   . Calcium-Phosphorus-Vitamin D (CITRACAL +D3 PO) Take 1 tablet by mouth daily.    [name] CARTIA XT 120 MG 24 hr capsule Take 120 mg by mouth every evening.     . cholecalciferol (VITAMIN D) 400 UNITS TABS tablet Take 400 Units by mouth daily.     . cloNIDine (CATAPRES) 0.1 MG tablet Take 1 tablet (0.1 mg total) by mouth 2 (two) times daily as needed. (Patient taking differently: Take 0.1 mg by mouth 2 (two) times daily as needed (hot flashes). ) 180 tablet 0  . cycloSPORINE (RESTASIS) 0.05 % ophthalmic emulsion Place 1 drop into both eyes 2 (two) times daily.     . fexofenadine (ALLEGRA) 180 MG tablet  Take 180 mg by mouth continuous as needed for allergies or rhinitis.    . flecainide (TAMBOCOR) 100 MG tablet Take 1 tablet (100 mg total) by mouth 2 (two) times daily. 180 tablet 1  . fluticasone (FLONASE) 50 MCG/ACT nasal spray Place 1 spray into both nostrils daily as needed for allergies. Reported on 01/19/2016    . glucosamine-chondroitin 500-400 MG tablet Take 1 tablet by mouth 2 (two) times daily.    . Multiple Vitamins-Minerals (PRESERVISION AREDS 2 PO) Take 1 capsule by mouth 2 (two) times daily.    . Omega-3 Fatty Acids (EQL OMEGA 3 FISH OIL) 1400 MG CAPS Take 1 capsule by mouth daily.    . ondansetron (ZOFRAN) 4 MG tablet Take 1 tablet (4 mg total) by mouth every 8 (eight) hours as needed for nausea or vomiting. 11 tablet 0  . Risedronate Sodium (ACTONEL PO) Take 1 tablet by mouth daily.     . sodium chloride (OCEAN) 0.65 % nasal spray Place 1 spray into the nose as needed for congestion. Reported on 01/19/2016    . trolamine salicylate (ASPERCREME) 10 % cream Apply 1 application topically as needed for muscle pain.     No  current facility-administered medications for this visit.     OBJECTIVE: Older white womanIn no acute distress Vitals:   01/23/17 1529  BP: (!) 125/57  Pulse: (!) 57  Resp: 17  Temp: 97 F (36.1 C)     Body mass index is 23.49 kg/m.    ECOG FS:1 - Symptomatic but completely ambulatory  Sclerae unicteric, EOMs intact Oropharynx clear and moist No cervical or supraclavicular adenopathy; moderate thyromegaly, left slightly greater than right Lungs no rales or rhonchi Heart regular rate and rhythm Abd soft, nontender, positive bowel sounds MSK no focal spinal tenderness, no upper extremity lymphedema Neuro: nonfocal, well oriented, appropriate affect Breasts: The right breast has undergone lumpectomy followed by radiation with no evidence of local recurrence. The left breast is unremarkable. Both axillae are benign.  LAB RESULTS:  CMP     Component Value Date/Time   NA 134 (L) 01/17/2017 1626   NA 137 01/14/2017 1524   K 4.1 01/17/2017 1626   K 4.1 01/14/2017 1524   CL 103 01/17/2017 1626   CO2 23 01/17/2017 1626   CO2 27 01/14/2017 1524   GLUCOSE 101 (H) 01/17/2017 1626   GLUCOSE 106 01/14/2017 1524   BUN 12 01/17/2017 1626   BUN 20.0 01/14/2017 1524   CREATININE 0.85 01/17/2017 1626   CREATININE 1.0 01/14/2017 1524   CALCIUM 8.3 (L) 01/17/2017 1626   CALCIUM 9.3 01/14/2017 1524   PROT 6.8 01/17/2017 1626   PROT 6.3 (L) 01/14/2017 1524   ALBUMIN 4.3 01/17/2017 1626   ALBUMIN 3.8 01/14/2017 1524   AST 28 01/17/2017 1626   AST 17 01/14/2017 1524   ALT 20 01/17/2017 1626   ALT 11 01/14/2017 1524   ALKPHOS 44 01/17/2017 1626   ALKPHOS 50 01/14/2017 1524   BILITOT 1.2 01/17/2017 1626   BILITOT 0.42 01/14/2017 1524   GFRNONAA >60 01/17/2017 1626   GFRAA >60 01/17/2017 1626    I No results found for: SPEP  Lab Results  Component Value Date   WBC 4.3 01/17/2017   NEUTROABS 4.4 01/14/2017   HGB 11.3 (L) 01/17/2017   HCT 33.2 (L) 01/17/2017   MCV 92.7  01/17/2017   PLT 173 01/17/2017      Chemistry      Component Value Date/Time   NA 134 (  L) 01/17/2017 1626   NA 137 01/14/2017 1524   K 4.1 01/17/2017 1626   K 4.1 01/14/2017 1524   CL 103 01/17/2017 1626   CO2 23 01/17/2017 1626   CO2 27 01/14/2017 1524   BUN 12 01/17/2017 1626   BUN 20.0 01/14/2017 1524   CREATININE 0.85 01/17/2017 1626   CREATININE 1.0 01/14/2017 1524      Component Value Date/Time   CALCIUM 8.3 (L) 01/17/2017 1626   CALCIUM 9.3 01/14/2017 1524   ALKPHOS 44 01/17/2017 1626   ALKPHOS 50 01/14/2017 1524   AST 28 01/17/2017 1626   AST 17 01/14/2017 1524   ALT 20 01/17/2017 1626   ALT 11 01/14/2017 1524   BILITOT 1.2 01/17/2017 1626   BILITOT 0.42 01/14/2017 1524       No results found for: LABCA2  No components found for: LABCA125  No results for input(s): INR in the last 168 hours.  Urinalysis    Component Value Date/Time   COLORURINE STRAW (A) 01/17/2017 1835    STUDIES: Ct Abdomen Pelvis W Contrast  Result Date: 01/17/2017 CLINICAL DATA:  Diarrhea, generalized abdominal pain. EXAM: CT ABDOMEN AND PELVIS WITH CONTRAST TECHNIQUE: Multidetector CT imaging of the abdomen and pelvis was performed using the standard protocol following bolus administration of intravenous contrast. CONTRAST:  52m ISOVUE-300 IOPAMIDOL (ISOVUE-300) INJECTION 61% COMPARISON:  None. FINDINGS: Lower chest: No acute abnormality. Hepatobiliary: No gallstones are noted. Multiple hepatic cysts of varying sizes are noted. Pancreas: Unremarkable. No pancreatic ductal dilatation or surrounding inflammatory changes. Spleen: Normal in size without focal abnormality. Adrenals/Urinary Tract: Adrenal glands are unremarkable. Kidneys are normal, without renal calculi, focal lesion, or hydronephrosis. Bladder is unremarkable. Stomach/Bowel: There is no evidence of bowel obstruction. Vascular/Lymphatic: Aortic atherosclerosis. No enlarged abdominal or pelvic lymph nodes. Reproductive: Status  post hysterectomy. No adnexal masses. Other: No abdominal wall hernia or abnormality. No abdominopelvic ascites. Musculoskeletal: No acute or significant osseous findings. IMPRESSION: Aortic atherosclerosis. Hepatic cysts. No acute abnormality seen in the abdomen or pelvis. Electronically Signed   By: JMarijo Conception M.D.   On: 01/17/2017 20:36    CLINICAL DATA:  Low back pain with bilateral upper leg pain, right greater than left. Lumbar spine surgery 8 years ago. Degenerative disc disease of the lumbar spine.  EXAM: MRI LUMBAR SPINE WITHOUT CONTRAST  TECHNIQUE: Multiplanar, multisequence MR imaging of the lumbar spine was performed. No intravenous contrast was administered.  COMPARISON:  Lumbar spine radiographs 02/03/2015. MRI lumbar spine 01/26/2015.  FINDINGS: Segmentation: 5 non rib-bearing lumbar type vertebral bodies are present.  Alignment: Slight anterolisthesis at L4-5, the fused segment is stable. AP alignment is otherwise anatomic. Leftward curvature of the lumbar spine is centered at L2. There is rightward curvature at L4-5.  Vertebrae: There is diffuse fatty infiltration of the marrow space. Chronic endplate changes are again noted at L5-S1.  Conus medullaris: Extends to the T12-L1 level and appears normal.  Paraspinal and other soft tissues: Limited imaging of the abdomen is unremarkable. There is no significant adenopathy. The paraspinous musculature is within normal limits.  Disc levels:  T12-L1: Slight retrolisthesis is present. A broad-based disc protrusion is noted. Mild foraminal narrowing is present bilaterally.  L1-2: Mild disc bulging and facet hypertrophy are present without significant stenosis.  L2-3: A mild broad-based disc bulge is stable. Mild bilateral facet hypertrophy is unchanged. There is no significant stenosis or change.  L3-4: A broad-based disc protrusion is again seen. Moderate facet hypertrophy is noted. Mild left  subarticular  narrowing is stable. Moderate left and mild right foraminal narrowing is stable.  L4-5: Wide laminectomy is performed. The central canal and foramina are patent.  L5-S1: Mild facet hypertrophy is again seen. There is no focal disc protrusion or stenosis.  IMPRESSION: 1. Stable appearance of the lumbar spine. 2. PLIF at L4-5 without residual or recurrent stenosis. 3. Adjacent level disease at L3-4 with stable mild left subarticular narrowing, moderate left, and mild right foraminal stenosis. 4. Mild disc bulging at L1-2 and L2-3 without significant stenosis or change. 5. Stable mild bilateral foraminal narrowing at T12-L1.   Electronically Signed   By: San Morelle M.D.   On: 09/16/2016 11:55     ASSESSMENT: 83 y.o. Katherine Powell woman  (1) Status post right Breast upper outer quadrant lumpectomy 11/09/2013 for a pT1c NX, stage IA invasive ductal carcinoma, grade 2, estrogen receptor 100% positive, progesterone receptor 94% positive, with an MIB-1 of 10%, and no HER-2 amplification.  (2) adjuvant radiation completed February 2015.  (3) anastrozole started February 2015  (a) bone density at [facility] February 2015 shows mild osteopenia with a T score of -1.3  (b) bone density at [facility] 03/29/2016 shows a T score of -1.7.   (4) the patient is status post hysterectomy and bilateral salpingo-oophorectomy  (5) atrial fibrillation, on Rivaroxaban  PLAN: Jerzy is now a little over 3 years out from definitive surgery for her breast cancer with no evidence of disease recurrence. This is very favorable  She has had a CT of the abdomen and pelvis and an MRI of the spine within the last 4 months for unrelated reasons and there is no evidence of metastatic disease, which is of course always good news  She is tolerating the anastrozole well and the plan is to continue that an additional 2 years because of her osteopenia she also receives denosumab/ Prolia, with her  second dose today. She will receive her next dose in 6 months. Her next bone density will be due April 2019.  She has fairly marked thyromegaly, which I do not recall noticing before. We are going to obtain a baseline thyroid ultrasound. I will call her with those results. She understands most of the time all we do is repeat the ultrasound after a few months but in case there is a need for biopsy we discussed that process as well.  Otherwise she will see me 6 months from now with her next Prolia dose. She knows to call for any problems that may develop before that visit Chauncey Cruel, MD   01/23/2017 3:38 PM

## 2017-02-08 ENCOUNTER — Ambulatory Visit (HOSPITAL_COMMUNITY)
Admission: RE | Admit: 2017-02-08 | Discharge: 2017-02-08 | Disposition: A | Discharge status: Home / Self Care | Payer: PPO | Source: Ambulatory Visit | Attending: Oncology | Admitting: Oncology

## 2017-02-08 DIAGNOSIS — E049 Nontoxic goiter, unspecified: Secondary | ICD-10-CM | POA: Diagnosis not present

## 2017-02-08 DIAGNOSIS — Z7901 Long term (current) use of anticoagulants: Secondary | ICD-10-CM | POA: Diagnosis not present

## 2017-02-08 DIAGNOSIS — Z170 Estrogen receptor positive status [ER+]: Secondary | ICD-10-CM | POA: Insufficient documentation

## 2017-02-08 DIAGNOSIS — C50411 Malignant neoplasm of upper-outer quadrant of right female breast: Secondary | ICD-10-CM | POA: Diagnosis not present

## 2017-02-08 DIAGNOSIS — E042 Nontoxic multinodular goiter: Secondary | ICD-10-CM | POA: Diagnosis not present

## 2017-02-08 DIAGNOSIS — M8580 Other specified disorders of bone density and structure, unspecified site: Secondary | ICD-10-CM | POA: Diagnosis not present

## 2017-02-11 ENCOUNTER — Other Ambulatory Visit: Payer: Self-pay | Admitting: Oncology

## 2017-02-11 DIAGNOSIS — E042 Nontoxic multinodular goiter: Secondary | ICD-10-CM | POA: Insufficient documentation

## 2017-02-11 DIAGNOSIS — E041 Nontoxic single thyroid nodule: Secondary | ICD-10-CM

## 2017-02-11 NOTE — Progress Notes (Unsigned)
I called the Man and told her the results of her thyroid studies. She is agreeable to biopsies. She isn't on apixaban. This may or may not be needed to be withheld for a few days prior to the biopsy. I will let the radiology department contact her regarding that  She tells me she strangle's easily. Certainly this could be due to the thyroid enlargement. We will consider Synthroid for her. She will see me in approximately 6 weeks or a couple of weeks after the biopsy.

## 2017-02-13 ENCOUNTER — Telehealth: Payer: Self-pay | Admitting: Oncology

## 2017-02-13 ENCOUNTER — Other Ambulatory Visit: Payer: Self-pay | Admitting: Oncology

## 2017-02-13 DIAGNOSIS — H01021 Squamous blepharitis right upper eyelid: Secondary | ICD-10-CM | POA: Diagnosis not present

## 2017-02-13 DIAGNOSIS — Z961 Presence of intraocular lens: Secondary | ICD-10-CM | POA: Diagnosis not present

## 2017-02-13 DIAGNOSIS — H01022 Squamous blepharitis right lower eyelid: Secondary | ICD-10-CM | POA: Diagnosis not present

## 2017-02-13 DIAGNOSIS — H01024 Squamous blepharitis left upper eyelid: Secondary | ICD-10-CM | POA: Diagnosis not present

## 2017-02-13 DIAGNOSIS — H26493 Other secondary cataract, bilateral: Secondary | ICD-10-CM | POA: Diagnosis not present

## 2017-02-13 DIAGNOSIS — H353131 Nonexudative age-related macular degeneration, bilateral, early dry stage: Secondary | ICD-10-CM | POA: Diagnosis not present

## 2017-02-13 DIAGNOSIS — H02052 Trichiasis without entropian right lower eyelid: Secondary | ICD-10-CM | POA: Diagnosis not present

## 2017-02-13 DIAGNOSIS — H43813 Vitreous degeneration, bilateral: Secondary | ICD-10-CM | POA: Diagnosis not present

## 2017-02-13 DIAGNOSIS — H01025 Squamous blepharitis left lower eyelid: Secondary | ICD-10-CM | POA: Diagnosis not present

## 2017-02-13 DIAGNOSIS — H04123 Dry eye syndrome of bilateral lacrimal glands: Secondary | ICD-10-CM | POA: Diagnosis not present

## 2017-02-13 NOTE — Telephone Encounter (Signed)
Scheduled appt per MD and followed up on biopsy. [facility]  Per Kristeen Miss order is in and she will make schedulers aware and they will schedule and follow up with the patient .

## 2017-02-14 ENCOUNTER — Other Ambulatory Visit: Payer: Self-pay | Admitting: Oncology

## 2017-02-14 DIAGNOSIS — E041 Nontoxic single thyroid nodule: Secondary | ICD-10-CM

## 2017-02-15 ENCOUNTER — Telehealth: Payer: Self-pay | Admitting: *Deleted

## 2017-02-15 NOTE — Telephone Encounter (Signed)
"  Juliann Pulse with [facility] calling to ask Dr.Magrinat if patient may stop Eliquis for 48 hours before having the thyroid biopsy."  Verbal order received and read back from Dr. Jana Hakim that yes, patient may stop eliquis for thyroid biopsy.  Juliann Pulse notified.  "We need this order in writing.  Fax number is [phone]."

## 2017-02-19 ENCOUNTER — Encounter: Payer: Self-pay | Admitting: Emergency Medicine

## 2017-02-19 NOTE — Telephone Encounter (Signed)
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  Calling to Follow up on the letter we requested last week."  Call transferred ext [phone].

## 2017-02-27 ENCOUNTER — Ambulatory Visit
Admission: RE | Admit: 2017-02-27 | Discharge: 2017-02-27 | Disposition: A | Discharge status: Home / Self Care | Payer: PPO | Source: Ambulatory Visit | Attending: Oncology | Admitting: Oncology

## 2017-02-27 ENCOUNTER — Other Ambulatory Visit (HOSPITAL_COMMUNITY)
Admission: RE | Admit: 2017-02-27 | Discharge: 2017-02-27 | Disposition: A | Discharge status: Home / Self Care | Payer: PPO | Source: Ambulatory Visit | Attending: General Surgery | Admitting: General Surgery

## 2017-02-27 DIAGNOSIS — E042 Nontoxic multinodular goiter: Secondary | ICD-10-CM | POA: Diagnosis not present

## 2017-02-27 DIAGNOSIS — E041 Nontoxic single thyroid nodule: Secondary | ICD-10-CM

## 2017-03-04 ENCOUNTER — Other Ambulatory Visit: Payer: Self-pay | Admitting: Oncology

## 2017-03-27 ENCOUNTER — Other Ambulatory Visit: Payer: Self-pay | Admitting: Oncology

## 2017-03-27 DIAGNOSIS — H01025 Squamous blepharitis left lower eyelid: Secondary | ICD-10-CM | POA: Diagnosis not present

## 2017-03-27 DIAGNOSIS — H01024 Squamous blepharitis left upper eyelid: Secondary | ICD-10-CM | POA: Diagnosis not present

## 2017-03-27 DIAGNOSIS — H02052 Trichiasis without entropian right lower eyelid: Secondary | ICD-10-CM | POA: Diagnosis not present

## 2017-03-27 DIAGNOSIS — C50411 Malignant neoplasm of upper-outer quadrant of right female breast: Secondary | ICD-10-CM

## 2017-03-27 DIAGNOSIS — H01022 Squamous blepharitis right lower eyelid: Secondary | ICD-10-CM | POA: Diagnosis not present

## 2017-03-27 DIAGNOSIS — H04123 Dry eye syndrome of bilateral lacrimal glands: Secondary | ICD-10-CM | POA: Diagnosis not present

## 2017-03-27 DIAGNOSIS — H0014 Chalazion left upper eyelid: Secondary | ICD-10-CM | POA: Diagnosis not present

## 2017-03-27 DIAGNOSIS — Z961 Presence of intraocular lens: Secondary | ICD-10-CM | POA: Diagnosis not present

## 2017-03-27 DIAGNOSIS — H01021 Squamous blepharitis right upper eyelid: Secondary | ICD-10-CM | POA: Diagnosis not present

## 2017-03-29 ENCOUNTER — Ambulatory Visit (HOSPITAL_BASED_OUTPATIENT_CLINIC_OR_DEPARTMENT_OTHER): Payer: PPO | Admitting: Oncology

## 2017-03-29 VITALS — BP 124/61 | HR 56 | Temp 97.6°F | Resp 20 | Wt 152.0 lb

## 2017-03-29 DIAGNOSIS — C50411 Malignant neoplasm of upper-outer quadrant of right female breast: Secondary | ICD-10-CM

## 2017-03-29 DIAGNOSIS — Z79811 Long term (current) use of aromatase inhibitors: Secondary | ICD-10-CM | POA: Diagnosis not present

## 2017-03-29 DIAGNOSIS — Z170 Estrogen receptor positive status [ER+]: Secondary | ICD-10-CM

## 2017-03-29 DIAGNOSIS — I4891 Unspecified atrial fibrillation: Secondary | ICD-10-CM

## 2017-03-29 DIAGNOSIS — M8580 Other specified disorders of bone density and structure, unspecified site: Secondary | ICD-10-CM

## 2017-03-29 MED ORDER — GABAPENTIN 100 MG PO CAPS: 100.0000 mg | ORAL_CAPSULE | Freq: Every day | ORAL | 4 refills | Status: DC

## 2017-03-29 NOTE — Progress Notes (Signed)
Katherine Powell  [phone] [phone]   ID: Katherine Powell OB: [birth year]  MR#: [id number]  CHE#:[id number]  PCP: Cari Caraway, MD GYN:   SUCoralie Keens OTHER MD: Arloa Koh, Christene Slates, Rob Hickman, Daneen Schick  CHIEF COMPLAINT: Estrogen receptor positive breast cancer  Current treatment: Anastrozole, denosumab/Prolia  HISTORY OF PRESENT ILLNESS: From the original intake note:  Katherine Powell had routine screening mammography at [facility] 10/14/2013 showing a 1 cm irregular mass in the right breast. Right digital mammography and ultrasonography of the right breast 10/21/2013 showed a mass with indistinct margins at the 12:00 position which by ultrasound measured 1 cm, and was lobulated. Biopsy was recommended, but the patient opted to proceed directly with lumpectomy, and this was performed 11/09/2013. The final pathology ([id number]) showed an invasive ductal carcinoma, grade 2, measuring 1.1 cm, with low-grade ductal carcinoma in situ. The tumor was estrogen receptor 100% positive, and progesterone receptor 94% positive, both with strong staining intensity. The MIB-1 was 10%. There was no HER-2 amplification with a ratio by CISH of 0.90, and the number per cell being 1.80.  The patient's subsequent history is as detailed below   INTERVAL HISTORY: Katherine Powell returns today for follow-up of her estrogen receptor positive breast cancer. She continues on anastrozole, generally with good tolerance.   Hot flashes and vaginal dryness are not a major issue. She never developed the arthralgias or myalgias that many patients can experience on this medication. She obtains it at a good price.  She also receives denosumab/Prolia for her osteopenia. Her most recent dose was in February and she is scheduled for another dose in mid August. She has no side effects from those treatments. She tells me her teeth are "fine". She gets them taken care of through a relative in  [place] once a year.  Since her last visit here Katherine Powell had biopsy of enlarged nodules in her right and left thyroid. Both biopsies were performed 02/27/2017. They both returned with benign follicular pathology (Bethesda category 2). This requires no further evaluation(NZK [id number] and 525).  REVIEW OF SYSTEMS: She has been taking clonidine twice daily for hot flashes. It "kind of works", but it dries her mouth and so she doesn't take it and the weekend, when she sings at church. She tolerated her thyroid biopsies well although one of them was a little bit painful. She exercises regularly by doing weights and yoga. A detailed review of systems today was otherwise stable  PAST MEDICAL HISTORY: Past Medical History:  Diagnosis Date  . Anxiety   . Arrhythmia    atrial fibrillation/  on   . Arthritis   . Atrial fibrillation ([place])   . Breast cancer ([place]) 11/09/13   right upper outer  . Contact lens/glasses fitting    contact rt eye  . Hx of radiation therapy 12/15/13- 01/11/14   right breast 4250 cGy in 17 sessions, (hypo-fractionated), no boost  . Hypertension   . Postmenopausal   . Rosacea   . Seasonal allergies   . Sleep disturbance   . Thyroid disease   . Urinary incontinence     PAST SURGICAL HISTORY: Past Surgical History:  Procedure Laterality Date  . ABDOMINAL HYSTERECTOMY  1973   endometriosis  . APPENDECTOMY  1954  . BACK SURGERY     lumb-2005  . BILATERAL OOPHORECTOMY     benign tumor on ovary  . BREAST LUMPECTOMY WITH NEEDLE LOCALIZATION Right 11/09/2013   Procedure: BREAST LUMPECTOMY WITH NEEDLE LOCALIZATION;  Surgeon: Nathaneil Canary  Lynann [facility], MD;  Location: [facility];  Service: General;  Laterality: Right;  . CATARACT EXTRACTION  2013   both  . CYSTOCELE REPAIR  2008  . EYE SURGERY     cataracts-plugs for eyes  . RECTOCELE REPAIR  2008  . TONSILLECTOMY      FAMILY HISTORY Family History  Problem Relation Age of Onset  . Alzheimer's disease Mother     . Stroke Father   . Heart attack Neg Hx    the patient's father died from COPD at age 64 in the setting of tobacco abuse. The patient's mother died at the age of 53 with Alzheimer's disease. The patient had no brothers, one sister. There is no history of breast or ovarian cancer in the family to her knowledge  GYNECOLOGIC HISTORY:  Menarche age 89, first live birth age 4. The patient is GX P3. She underwent simple hysterectomy in 1973 and took hormone replacement (initially Prempro later estrogens only) until December 2014. 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 Her husband Deidre Ala was an Chief Financial Officer for [employer]. He died 2015-05-12 from prostate cancer.Their son Katherine Powell lives in [place]. where he works in Engineer, technical sales. Daughter Katherine Powell lives in [place] where she works as a Designer, jewellery in an independent clinic. Daughter Katherine Powell listened Katherine Powell. She works as a Tree surgeon. The patient has 7 grandchildren, 2 of whom are getting married in the winter of 2016. She is a Tourist information centre manager.    ADVANCED DIRECTIVES: In place; the patient has named her daughter, Katherine Powell, is a Designer, jewellery, as her healthcare power of attorney. And can be reached at [phone].   HEALTH MAINTENANCE: Social History  Substance Use Topics  . Smoking status: Never Smoker  . Smokeless tobacco: Never Used  . Alcohol use No     Colonoscopy:  PAP:  Bone density: At [facility] 09/27/2010, normal  Lipid panel:  Allergies  Allergen Reactions  . Codeine Nausea Only  . Gabapentin Other (See Comments)    Makes her very sleepy    Current Outpatient Prescriptions  Medication Sig Dispense Refill  . acetaminophen (TYLENOL) 650 MG CR tablet Take 650 mg by mouth every 8 (eight) hours as needed for pain. Reported on  01/19/2016    . ALPRAZolam (XANAX) 0.5 MG tablet Take 0.5 mg by mouth at bedtime as needed for anxiety.    [name] anastrozole (ARIMIDEX) 1 MG tablet TAKE 1 TABLET BY MOUTH DAILY 90 tablet 0  . apixaban (ELIQUIS) 5 MG TABS tablet Take 1 tablet (5 mg total) by mouth 2 (two) times daily. 60 tablet   . Calcium-Phosphorus-Vitamin D (CITRACAL +D3 PO) Take 1 tablet by mouth daily.    [name] CARTIA XT 120 MG 24 hr capsule Take 120 mg by mouth every evening.     . cholecalciferol (VITAMIN D) 400 UNITS TABS tablet Take 400 Units by mouth daily.     . cloNIDine (CATAPRES) 0.1 MG tablet Take 1 tablet (0.1 mg total) by mouth 2 (two) times daily as needed. (Patient taking differently: Take 0.1 mg by mouth 2 (two) times daily as needed (hot flashes). ) 180 tablet 0  . cycloSPORINE (RESTASIS) 0.05 % ophthalmic emulsion Place 1 drop into both eyes 2 (two) times daily.     . fexofenadine (ALLEGRA) 180 MG tablet Take  180 mg by mouth continuous as needed for allergies or rhinitis.    . flecainide (TAMBOCOR) 100 MG tablet Take 1 tablet (100 mg total) by mouth 2 (two) times daily. 180 tablet 1  . fluticasone (FLONASE) 50 MCG/ACT nasal spray Place 1 spray into both nostrils daily as needed for allergies. Reported on 01/19/2016    . glucosamine-chondroitin 500-400 MG tablet Take 1 tablet by mouth 2 (two) times daily.    . Multiple Vitamins-Minerals (PRESERVISION AREDS 2 PO) Take 1 capsule by mouth 2 (two) times daily.    . Omega-3 Fatty Acids (EQL OMEGA 3 FISH OIL) 1400 MG CAPS Take 1 capsule by mouth daily.    . ondansetron (ZOFRAN) 4 MG tablet Take 1 tablet (4 mg total) by mouth every 8 (eight) hours as needed for nausea or vomiting. 11 tablet 0  . Risedronate Sodium (ACTONEL PO) Take 1 tablet by mouth daily.     . sodium chloride (OCEAN) 0.65 % nasal spray Place 1 spray into the nose as needed for congestion. Reported on 01/19/2016    . trolamine salicylate (ASPERCREME) 10 % cream Apply 1 application topically as needed for muscle  pain.     No current facility-administered medications for this visit.     OBJECTIVE: Older white woman who appears well Vitals:   03/29/17 1038  BP: 124/61  Pulse: (!) 56  Resp: 20  Temp: 97.6 F (36.4 C)     Body mass index is 23.46 kg/m.    ECOG FS:1 - Symptomatic but completely ambulatory  Sclerae unicteric, pupils round and equal Oropharynx clear and moist No cervical or supraclavicular adenopathy, no erythema or swelling from the recent biopsies Lungs no rales or rhonchi Heart regular rate and rhythm Abd soft, nontender, positive bowel sounds MSK no focal spinal tenderness, no upper extremity lymphedema Neuro: nonfocal, well oriented, appropriate affect Breasts: The right breast is status post lumpectomy and radiation, with no evidence of local recurrence. The left breast is unremarkable. Both axillae are benign.  LAB RESULTS:  CMP     Component Value Date/Time   NA 134 (L) 01/17/2017 1626   NA 137 01/14/2017 1524   K 4.1 01/17/2017 1626   K 4.1 01/14/2017 1524   CL 103 01/17/2017 1626   CO2 23 01/17/2017 1626   CO2 27 01/14/2017 1524   GLUCOSE 101 (H) 01/17/2017 1626   GLUCOSE 106 01/14/2017 1524   BUN 12 01/17/2017 1626   BUN 20.0 01/14/2017 1524   CREATININE 0.85 01/17/2017 1626   CREATININE 1.0 01/14/2017 1524   CALCIUM 8.3 (L) 01/17/2017 1626   CALCIUM 9.3 01/14/2017 1524   PROT 6.8 01/17/2017 1626   PROT 6.3 (L) 01/14/2017 1524   ALBUMIN 4.3 01/17/2017 1626   ALBUMIN 3.8 01/14/2017 1524   AST 28 01/17/2017 1626   AST 17 01/14/2017 1524   ALT 20 01/17/2017 1626   ALT 11 01/14/2017 1524   ALKPHOS 44 01/17/2017 1626   ALKPHOS 50 01/14/2017 1524   BILITOT 1.2 01/17/2017 1626   BILITOT 0.42 01/14/2017 1524   GFRNONAA >60 01/17/2017 1626   GFRAA >60 01/17/2017 1626    I No results found for: SPEP  Lab Results  Component Value Date   WBC 4.3 01/17/2017   NEUTROABS 4.4 01/14/2017   HGB 11.3 (L) 01/17/2017   HCT 33.2 (L) 01/17/2017   MCV 92.7  01/17/2017   PLT 173 01/17/2017      Chemistry      Component Value Date/Time   NA  134 (L) 01/17/2017 1626   NA 137 01/14/2017 1524   K 4.1 01/17/2017 1626   K 4.1 01/14/2017 1524   CL 103 01/17/2017 1626   CO2 23 01/17/2017 1626   CO2 27 01/14/2017 1524   BUN 12 01/17/2017 1626   BUN 20.0 01/14/2017 1524   CREATININE 0.85 01/17/2017 1626   CREATININE 1.0 01/14/2017 1524      Component Value Date/Time   CALCIUM 8.3 (L) 01/17/2017 1626   CALCIUM 9.3 01/14/2017 1524   ALKPHOS 44 01/17/2017 1626   ALKPHOS 50 01/14/2017 1524   AST 28 01/17/2017 1626   AST 17 01/14/2017 1524   ALT 20 01/17/2017 1626   ALT 11 01/14/2017 1524   BILITOT 1.2 01/17/2017 1626   BILITOT 0.42 01/14/2017 1524       No results found for: LABCA2  No components found for: LABCA125  No results for input(s): INR in the last 168 hours.  Urinalysis    Component Value Date/Time   COLORURINE STRAW (A) 01/17/2017 1835    STUDIES: No results found.    ASSESSMENT: 83 y.o. Katherine Powell woman  (1) Status post right Breast upper outer quadrant lumpectomy 11/09/2013 for a pT1c NX, stage IA invasive ductal carcinoma, grade 2, estrogen receptor 100% positive, progesterone receptor 94% positive, with an MIB-1 of 10%, and no HER-2 amplification.  (2) adjuvant radiation completed February 2015.  (3) anastrozole started February 2015  (a) bone density at [facility] February 2015 shows mild osteopenia with a T score of -1.3  (b) bone density at [facility] 03/29/2016 shows a T score of -1.7.   (4) the patient is status post hysterectomy and bilateral salpingo-oophorectomy  (5) atrial fibrillation, on Rivaroxaban  PLAN: I spent approximately 30 minutes with Katherine Powell with most of that time spent discussing her several several problems. We reviewed her thyroid biopsies and I gave her a copy of the pathology report. She understands "Bethesda 2" means the tissue is frankly benign. There is no "location.  We discussed the  pros and cons of going on thyroid suppression with low-dose Synthroid. After much discussion we decided against it and we will simply continue to observe. She will have a thyroid ultrasound sometime mid next year.  We reviewed her anastrozole and she is currently a little over 3 years into the five-year program. She has 2 years to go. She generally tolerates it well, except for the hot flashes.  We are going to add gabapentin 100 mg at bedtime. I think that will be easier on her than the clonidine but she may continue with clonidine as well if she wishes. She understands this should not affect her daytime hot flash problem.  She will have her next dose of Prolia in August. The After that we'll be in February. She will see me with the February dose. She knows to call for any problems that may develop before then.  Chauncey Cruel, MD   03/29/2017 10:47 AM

## 2017-04-04 ENCOUNTER — Telehealth: Payer: Self-pay

## 2017-04-04 DIAGNOSIS — L821 Other seborrheic keratosis: Secondary | ICD-10-CM | POA: Diagnosis not present

## 2017-04-04 DIAGNOSIS — B351 Tinea unguium: Secondary | ICD-10-CM | POA: Diagnosis not present

## 2017-04-04 DIAGNOSIS — D1801 Hemangioma of skin and subcutaneous tissue: Secondary | ICD-10-CM | POA: Diagnosis not present

## 2017-04-04 NOTE — Telephone Encounter (Signed)
Pt called and lvm that she cannot tolerate the gabapentin. She was "a zombie, could not function." she went to zumba and got nauseated and had to go home. She also mentioned "I will go back on other drug". Attempted to call back to find out what the "other drug" was that she referred to, no answer.

## 2017-04-10 ENCOUNTER — Telehealth: Payer: Self-pay

## 2017-04-10 NOTE — Telephone Encounter (Signed)
LVM for pt to call back.

## 2017-04-10 NOTE — Telephone Encounter (Signed)
LVM for pt to call and let us know how she is doiong

## 2017-04-10 NOTE — Telephone Encounter (Signed)
LVM with pt to call back to discuss her s/es with the gabapentin

## 2017-04-30 ENCOUNTER — Other Ambulatory Visit: Payer: Self-pay | Admitting: Oncology

## 2017-06-18 ENCOUNTER — Other Ambulatory Visit: Payer: Self-pay | Admitting: Interventional Cardiology

## 2017-06-18 ENCOUNTER — Other Ambulatory Visit: Payer: Self-pay | Admitting: Oncology

## 2017-06-18 DIAGNOSIS — C50411 Malignant neoplasm of upper-outer quadrant of right female breast: Secondary | ICD-10-CM

## 2017-07-24 ENCOUNTER — Ambulatory Visit: Payer: PPO | Admitting: Oncology

## 2017-07-24 ENCOUNTER — Other Ambulatory Visit: Payer: PPO

## 2017-07-24 ENCOUNTER — Ambulatory Visit: Payer: PPO

## 2017-07-25 ENCOUNTER — Ambulatory Visit (HOSPITAL_BASED_OUTPATIENT_CLINIC_OR_DEPARTMENT_OTHER): Payer: PPO

## 2017-07-25 ENCOUNTER — Other Ambulatory Visit (HOSPITAL_BASED_OUTPATIENT_CLINIC_OR_DEPARTMENT_OTHER): Payer: PPO

## 2017-07-25 VITALS — BP 130/76 | HR 60 | Temp 98.0°F | Resp 18

## 2017-07-25 DIAGNOSIS — C50411 Malignant neoplasm of upper-outer quadrant of right female breast: Secondary | ICD-10-CM | POA: Diagnosis not present

## 2017-07-25 DIAGNOSIS — M8580 Other specified disorders of bone density and structure, unspecified site: Secondary | ICD-10-CM

## 2017-07-25 DIAGNOSIS — Z170 Estrogen receptor positive status [ER+]: Principal | ICD-10-CM

## 2017-07-25 LAB — CBC WITH DIFFERENTIAL/PLATELET
BASO%: 0.2 % (ref 0.0–2.0)
Basophils Absolute: 0 10*3/uL (ref 0.0–0.1)
EOS%: 2.3 % (ref 0.0–7.0)
Eosinophils Absolute: 0.1 10*3/uL (ref 0.0–0.5)
HCT: 34.6 % — ABNORMAL LOW (ref 34.8–46.6)
HGB: 11.8 g/dL (ref 11.6–15.9)
LYMPH%: 26.7 % (ref 14.0–49.7)
MCH: 32.7 pg (ref 25.1–34.0)
MCHC: 34.1 g/dL (ref 31.5–36.0)
MCV: 95.8 fL (ref 79.5–101.0)
MONO#: 0.5 10*3/uL (ref 0.1–0.9)
MONO%: 11.6 % (ref 0.0–14.0)
NEUT#: 2.6 10*3/uL (ref 1.5–6.5)
NEUT%: 59.2 % (ref 38.4–76.8)
Platelets: 172 10*3/uL (ref 145–400)
RBC: 3.61 10*6/uL — ABNORMAL LOW (ref 3.70–5.45)
RDW: 13.7 % (ref 11.2–14.5)
WBC: 4.3 10*3/uL (ref 3.9–10.3)
lymph#: 1.2 10*3/uL (ref 0.9–3.3)

## 2017-07-25 LAB — COMPREHENSIVE METABOLIC PANEL
ALT: 12 U/L (ref 0–55)
AST: 18 U/L (ref 5–34)
Albumin: 3.8 g/dL (ref 3.5–5.0)
Alkaline Phosphatase: 51 U/L (ref 40–150)
Anion Gap: 6 mEq/L (ref 3–11)
BUN: 20.9 mg/dL (ref 7.0–26.0)
CO2: 26 mEq/L (ref 22–29)
Calcium: 9.3 mg/dL (ref 8.4–10.4)
Chloride: 106 mEq/L (ref 98–109)
Creatinine: 1 mg/dL (ref 0.6–1.1)
EGFR: 53 mL/min/{1.73_m2} — ABNORMAL LOW (ref >90)
Glucose: 80 mg/dl (ref 70–140)
Potassium: 4.2 mEq/L (ref 3.5–5.1)
Sodium: 137 mEq/L (ref 136–145)
Total Bilirubin: 0.41 mg/dL (ref 0.20–1.20)
Total Protein: 6.5 g/dL (ref 6.4–8.3)

## 2017-07-25 MED ORDER — DENOSUMAB 60 MG/ML ~~LOC~~ SOLN
60.0000 mg | Freq: Once | SUBCUTANEOUS | Status: AC
  Administered 2017-07-25: 60 mg via SUBCUTANEOUS
  Filled 2017-07-25: qty 1

## 2017-07-25 NOTE — Patient Instructions (Signed)
Denosumab injection  What is this medicine?  DENOSUMAB (den oh sue mab) slows bone breakdown. Prolia is used to treat osteoporosis in women after menopause and in men. Xgeva is used to prevent bone fractures and other bone problems caused by cancer bone metastases. Xgeva is also used to treat giant cell tumor of the bone.  This medicine may be used for other purposes; ask your health care provider or pharmacist if you have questions.  What should I tell my health care provider before I take this medicine?  They need to know if you have any of these conditions:  -dental disease  -eczema  -infection or history of infections  -kidney disease or on dialysis  -low blood calcium or vitamin D  -malabsorption syndrome  -scheduled to have surgery or tooth extraction  -taking medicine that contains denosumab  -thyroid or parathyroid disease  -an unusual reaction to denosumab, other medicines, foods, dyes, or preservatives  -pregnant or trying to get pregnant  -breast-feeding  How should I use this medicine?  This medicine is for injection under the skin. It is given by a health care professional in a hospital or clinic setting.  If you are getting Prolia, a special MedGuide will be given to you by the pharmacist with each prescription and refill. Be sure to read this information carefully each time.  For Prolia, talk to your pediatrician regarding the use of this medicine in children. Special care may be needed. For Xgeva, talk to your pediatrician regarding the use of this medicine in children. While this drug may be prescribed for children as young as 13 years for selected conditions, precautions do apply.  Overdosage: If you think you have taken too much of this medicine contact a poison control center or emergency room at once.  NOTE: This medicine is only for you. Do not share this medicine with others.  What if I miss a dose?  It is important not to miss your dose. Call your doctor or health care professional if you are  unable to keep an appointment.  What may interact with this medicine?  Do not take this medicine with any of the following medications:  -other medicines containing denosumab  This medicine may also interact with the following medications:  -medicines that suppress the immune system  -medicines that treat cancer  -steroid medicines like prednisone or cortisone  This list may not describe all possible interactions. Give your health care provider a list of all the medicines, herbs, non-prescription drugs, or dietary supplements you use. Also tell them if you smoke, drink alcohol, or use illegal drugs. Some items may interact with your medicine.  What should I watch for while using this medicine?  Visit your doctor or health care professional for regular checks on your progress. Your doctor or health care professional may order blood tests and other tests to see how you are doing.  Call your doctor or health care professional if you get a cold or other infection while receiving this medicine. Do not treat yourself. This medicine may decrease your body's ability to fight infection.  You should make sure you get enough calcium and vitamin D while you are taking this medicine, unless your doctor tells you not to. Discuss the foods you eat and the vitamins you take with your health care professional.  See your dentist regularly. Brush and floss your teeth as directed. Before you have any dental work done, tell your dentist you are receiving this medicine.  Do   not become pregnant while taking this medicine or for 5 months after stopping it. Women should inform their doctor if they wish to become pregnant or think they might be pregnant. There is a potential for serious side effects to an unborn child. Talk to your health care professional or pharmacist for more information.  What side effects may I notice from receiving this medicine?  Side effects that you should report to your doctor or health care professional as soon as  possible:  -allergic reactions like skin rash, itching or hives, swelling of the face, lips, or tongue  -breathing problems  -chest pain  -fast, irregular heartbeat  -feeling faint or lightheaded, falls  -fever, chills, or any other sign of infection  -muscle spasms, tightening, or twitches  -numbness or tingling  -skin blisters or bumps, or is dry, peels, or red  -slow healing or unexplained pain in the mouth or jaw  -unusual bleeding or bruising  Side effects that usually do not require medical attention (Report these to your doctor or health care professional if they continue or are bothersome.):  -muscle pain  -stomach upset, gas  This list may not describe all possible side effects. Call your doctor for medical advice about side effects. You may report side effects to FDA at 1-800-FDA-1088.  Where should I keep my medicine?  This medicine is only given in a clinic, doctor's office, or other health care setting and will not be stored at home.  NOTE: This sheet is a summary. It may not cover all possible information. If you have questions about this medicine, talk to your doctor, pharmacist, or health care provider.      2016, Elsevier/Gold Standard. (2012-05-19 12:37:47)

## 2017-07-26 ENCOUNTER — Encounter: Payer: Self-pay | Admitting: Interventional Cardiology

## 2017-08-15 ENCOUNTER — Ambulatory Visit: Payer: PPO | Admitting: Interventional Cardiology

## 2017-08-27 DIAGNOSIS — J069 Acute upper respiratory infection, unspecified: Secondary | ICD-10-CM | POA: Diagnosis not present

## 2017-09-23 NOTE — Progress Notes (Signed)
Cardiology Office Note    Date:  09/24/2017   ID:  Tarisha, Fader 1932/08/24, MRN [id number]  PCP:  Cari Caraway, MD  Cardiologist: Sinclair Grooms, MD   Chief Complaint  Patient presents with  . Atrial Fibrillation    History of Present Illness:  Katherine Powell is a 83 y.o. female for follow-up of paroxysmal atrial fibrillation, rhythm control with flecainide therapy, and chronic anticoagulation using Eliquis.  Over the past year she has had 3 episodes of atrial fibrillation. One episode lasted greater than 6 hours. She is not taking flecainide as prescribed which was twice daily. She takes one dose at night, 100 mg. If she develops atrial fibrillation she will take it twice daily as prescribed.She denies orthopnea, and PND. Occasionally at night when she lies down she has indigestion. She does not have indigestion with physical activity.   Past Medical History:  Diagnosis Date  . Anxiety   . Arrhythmia    atrial fibrillation/  on   . Arthritis   . Atrial fibrillation ([place])   . Breast cancer ([place]) 11/09/13   right upper outer  . Contact lens/glasses fitting    contact rt eye  . Hx of radiation therapy 12/15/13- 01/11/14   right breast 4250 cGy in 17 sessions, (hypo-fractionated), no boost  . Hypertension   . Postmenopausal   . Rosacea   . Seasonal allergies   . Sleep disturbance   . Thyroid disease   . Urinary incontinence     Past Surgical History:  Procedure Laterality Date  . ABDOMINAL HYSTERECTOMY  1973   endometriosis  . APPENDECTOMY  1954  . BACK SURGERY     lumb-2005  . BILATERAL OOPHORECTOMY     benign tumor on ovary  . BREAST LUMPECTOMY WITH NEEDLE LOCALIZATION Right 11/09/2013   Procedure: BREAST LUMPECTOMY WITH NEEDLE LOCALIZATION;  Surgeon: Harl Bowie, MD;  Location: [facility];  Service: General;  Laterality: Right;  . CATARACT EXTRACTION  2013   both  . CYSTOCELE REPAIR  2008  . EYE SURGERY     cataracts-plugs for  eyes  . RECTOCELE REPAIR  2008  . TONSILLECTOMY      Current Medications: Outpatient Medications Prior to Visit  Medication Sig Dispense Refill  . acetaminophen (TYLENOL) 650 MG CR tablet Take 650 mg by mouth every 8 (eight) hours as needed for pain. Reported on 01/19/2016    . ALPRAZolam (XANAX) 0.5 MG tablet Take 0.5 mg by mouth at bedtime as needed for anxiety.    [name] anastrozole (ARIMIDEX) 1 MG tablet TAKE 1 TABLET BY MOUTH DAILY 90 tablet 3  . apixaban (ELIQUIS) 5 MG TABS tablet Take 1 tablet (5 mg total) by mouth 2 (two) times daily. 60 tablet   . Calcium-Phosphorus-Vitamin D (CITRACAL +D3 PO) Take 1 tablet by mouth daily.    [name] CARTIA XT 120 MG 24 hr capsule Take 120 mg by mouth every evening.     . cholecalciferol (VITAMIN D) 400 UNITS TABS tablet Take 400 Units by mouth 2 (two) times daily.     . cloNIDine (CATAPRES) 0.1 MG tablet TAKE 1 TABLET (0.1 MG TOTAL) BY MOUTH 2 (TWO) TIMES DAILY AS NEEDED. 180 tablet 0  . cycloSPORINE (RESTASIS) 0.05 % ophthalmic emulsion Place 1 drop into both eyes 2 (two) times daily.     . fexofenadine (ALLEGRA) 180 MG tablet Take 180 mg by mouth continuous as needed for allergies or rhinitis.    [name]  fluticasone (FLONASE) 50 MCG/ACT nasal spray Place 1 spray into both nostrils daily as needed for allergies. Reported on 01/19/2016    . glucosamine-chondroitin 500-400 MG tablet Take 1 tablet by mouth 2 (two) times daily.    . Omega-3 Fatty Acids (EQL OMEGA 3 FISH OIL) 1400 MG CAPS Take 1 capsule by mouth daily.    . Risedronate Sodium (ACTONEL PO) Take 1 tablet by mouth daily.     . sodium chloride (OCEAN) 0.65 % nasal spray Place 1 spray into the nose as needed for congestion. Reported on 01/19/2016    . trolamine salicylate (ASPERCREME) 10 % cream Apply 1 application topically as needed for muscle pain.    . flecainide (TAMBOCOR) 100 MG tablet TAKE 1 TABLET (100 MG TOTAL) BY MOUTH 2 (TWO) TIMES DAILY. (Patient not taking: Reported on 09/24/2017) 180 tablet 0  .  gabapentin (NEURONTIN) 100 MG capsule Take 1 capsule (100 mg total) by mouth at bedtime. (Patient not taking: Reported on 09/24/2017) 90 capsule 4  . Multiple Vitamins-Minerals (PRESERVISION AREDS 2 PO) Take 1 capsule by mouth 2 (two) times daily.    . ondansetron (ZOFRAN) 4 MG tablet Take 1 tablet (4 mg total) by mouth every 8 (eight) hours as needed for nausea or vomiting. (Patient not taking: Reported on 09/24/2017) 11 tablet 0   No facility-administered medications prior to visit.      Allergies:   Codeine and Gabapentin   Social History   Social History  . Marital status: Married    Spouse name: N/A  . Number of children: N/A  . Years of education: N/A   Social History Main Topics  . Smoking status: Never Smoker  . Smokeless tobacco: Never Used  . Alcohol use No  . Drug use: No  . Sexual activity: No     Comment: menarche age 66, P59, first live birth age 49,  HRT x 30 years   Other Topics Concern  . None   Social History Narrative  . None     Family History:  The patient's family history includes Alzheimer's disease in her mother; Stroke in her father.   ROS:   Please see the history of present illness.    Low back discomfort, difficulty with pain in her left foot.  All other systems reviewed and are negative.   PHYSICAL EXAM:   VS:  BP (!) 156/88   Pulse (!) 54   Ht 5\' 7"  (1.702 m)   Wt 154 lb 12.8 oz (70.2 kg)   BMI 24.25 kg/m    GEN: Well nourished, well developed, in no acute distress  HEENT: normal  Neck: no JVD, carotid bruits, or masses Cardiac: RRR; no murmurs, rubs, or gallops,no edema  Respiratory:  clear to auscultation bilaterally, normal work of breathing GI: soft, nontender, nondistended, + BS MS: no deformity or atrophy  Skin: warm and dry, no rash Neuro:  Alert and Oriented x 3, Strength and sensation are intact Psych: euthymic mood, full affect  Wt Readings from Last 3 Encounters:  09/24/17 154 lb 12.8 oz (70.2 kg)  03/29/17 152 lb  (68.9 kg)  01/23/17 152 lb 3.2 oz (69 kg)      Studies/Labs Reviewed:   EKG:  EKG  Sinus bradycardia, 54 bpm. No significant abnormality of the bradycardia.  Recent Labs: 07/25/2017: ALT 12; BUN 20.9; Creatinine 1.0; HGB 11.8; Platelets 172; Potassium 4.2; Sodium 137   Lipid Panel No results found for: CHOL, TRIG, HDL, CHOLHDL, VLDL, LDLCALC, LDLDIRECT  Additional  studies/ records that were reviewed today include:  None    ASSESSMENT:    1. Paroxysmal atrial fibrillation (HCC)   2. Encounter for monitoring flecainide therapy   3. Chronic anticoagulation      PLAN:  In order of problems listed above:  1. Rhythm control on flecainide. Medication noncompliance I.e. Flecainide 100 mg daily is the way she takes the medication rather than twice a day. She will take it twice a day if she develops atrial fibrillation. 2. No evidence of toxicity. 3. Eliquis daily without bleeding complications.  Clinical follow-up in one year. Continue medical therapy as noted.    Medication Adjustments/Labs and Tests Ordered: Current medicines are reviewed at length with the patient today.  Concerns regarding medicines are outlined above.  Medication changes, Labs and Tests ordered today are listed in the Patient Instructions below. There are no Patient Instructions on file for this visit.   Signed, Sinclair Grooms, MD  09/24/2017 9:13 AM    [facility] [address], [place], [place]  [address] Phone: [phone]; Fax: [phone]

## 2017-09-24 ENCOUNTER — Encounter (INDEPENDENT_AMBULATORY_CARE_PROVIDER_SITE_OTHER): Payer: Self-pay

## 2017-09-24 ENCOUNTER — Ambulatory Visit (INDEPENDENT_AMBULATORY_CARE_PROVIDER_SITE_OTHER): Payer: PPO | Admitting: Interventional Cardiology

## 2017-09-24 ENCOUNTER — Encounter: Payer: Self-pay | Admitting: Interventional Cardiology

## 2017-09-24 VITALS — BP 156/88 | HR 54 | Ht 67.0 in | Wt 154.8 lb

## 2017-09-24 DIAGNOSIS — I480 Paroxysmal atrial fibrillation: Secondary | ICD-10-CM | POA: Diagnosis not present

## 2017-09-24 DIAGNOSIS — Z7901 Long term (current) use of anticoagulants: Secondary | ICD-10-CM | POA: Diagnosis not present

## 2017-09-24 DIAGNOSIS — Z79899 Other long term (current) drug therapy: Secondary | ICD-10-CM | POA: Diagnosis not present

## 2017-09-24 DIAGNOSIS — Z5181 Encounter for therapeutic drug level monitoring: Secondary | ICD-10-CM | POA: Diagnosis not present

## 2017-09-24 NOTE — Patient Instructions (Signed)

## 2017-11-07 DIAGNOSIS — J309 Allergic rhinitis, unspecified: Secondary | ICD-10-CM | POA: Diagnosis not present

## 2017-11-07 DIAGNOSIS — C50911 Malignant neoplasm of unspecified site of right female breast: Secondary | ICD-10-CM | POA: Diagnosis not present

## 2017-11-07 DIAGNOSIS — I480 Paroxysmal atrial fibrillation: Secondary | ICD-10-CM | POA: Diagnosis not present

## 2017-11-07 DIAGNOSIS — I10 Essential (primary) hypertension: Secondary | ICD-10-CM | POA: Diagnosis not present

## 2017-11-07 DIAGNOSIS — Z7901 Long term (current) use of anticoagulants: Secondary | ICD-10-CM | POA: Diagnosis not present

## 2017-11-07 DIAGNOSIS — E559 Vitamin D deficiency, unspecified: Secondary | ICD-10-CM | POA: Diagnosis not present

## 2017-11-07 DIAGNOSIS — Z0000 Encounter for general adult medical examination without abnormal findings: Secondary | ICD-10-CM | POA: Diagnosis not present

## 2017-11-07 DIAGNOSIS — N951 Menopausal and female climacteric states: Secondary | ICD-10-CM | POA: Diagnosis not present

## 2017-11-07 DIAGNOSIS — Z5181 Encounter for therapeutic drug level monitoring: Secondary | ICD-10-CM | POA: Diagnosis not present

## 2017-11-07 DIAGNOSIS — N3946 Mixed incontinence: Secondary | ICD-10-CM | POA: Diagnosis not present

## 2017-11-07 DIAGNOSIS — N183 Chronic kidney disease, stage 3 (moderate): Secondary | ICD-10-CM | POA: Diagnosis not present

## 2017-11-07 DIAGNOSIS — M5136 Other intervertebral disc degeneration, lumbar region: Secondary | ICD-10-CM | POA: Diagnosis not present

## 2017-11-14 ENCOUNTER — Other Ambulatory Visit: Payer: Self-pay | Admitting: Interventional Cardiology

## 2017-12-18 DIAGNOSIS — M25561 Pain in right knee: Secondary | ICD-10-CM | POA: Diagnosis not present

## 2017-12-24 ENCOUNTER — Ambulatory Visit: Payer: PPO | Attending: Family Medicine | Admitting: Physical Therapy

## 2017-12-24 ENCOUNTER — Encounter: Payer: Self-pay | Admitting: Physical Therapy

## 2017-12-24 DIAGNOSIS — M25661 Stiffness of right knee, not elsewhere classified: Secondary | ICD-10-CM | POA: Insufficient documentation

## 2017-12-24 DIAGNOSIS — M25561 Pain in right knee: Secondary | ICD-10-CM | POA: Diagnosis not present

## 2017-12-24 DIAGNOSIS — R262 Difficulty in walking, not elsewhere classified: Secondary | ICD-10-CM | POA: Diagnosis not present

## 2017-12-24 NOTE — Therapy (Signed)
[place], [place], [address] Phone: [phone]   Fax:  [phone]  Physical Therapy Evaluation  Patient Details  Name: Katherine Powell MRN: [id number] Date of Birth: 05/28/1932 Referring Provider: W. Addison Lank   Encounter Date: 12/24/2017  PT End of Session - 12/24/17 1119    Visit Number  1    Date for PT Re-Evaluation  02/21/18    PT Start Time  0843    PT Stop Time  0928    PT Time Calculation (min)  45 min    Activity Tolerance  Patient tolerated treatment well    Behavior During Therapy  [facility] for tasks assessed/performed       Past Medical History:  Diagnosis Date  . Anxiety   . Arrhythmia    atrial fibrillation/  on   . Arthritis   . Atrial fibrillation ([place])   . Breast cancer ([place]) 11/09/13   right upper outer  . Contact lens/glasses fitting    contact rt eye  . Hx of radiation therapy 12/15/13- 01/11/14   right breast 4250 cGy in 17 sessions, (hypo-fractionated), no boost  . Hypertension   . Postmenopausal   . Rosacea   . Seasonal allergies   . Sleep disturbance   . Thyroid disease   . Urinary incontinence     Past Surgical History:  Procedure Laterality Date  . ABDOMINAL HYSTERECTOMY  1973   endometriosis  . APPENDECTOMY  1954  . BACK SURGERY     lumb-2005  . BILATERAL OOPHORECTOMY     benign tumor on ovary  . BREAST LUMPECTOMY WITH NEEDLE LOCALIZATION Right 11/09/2013   Procedure: BREAST LUMPECTOMY WITH NEEDLE LOCALIZATION;  Surgeon: Harl Bowie, MD;  Location: [place];  Service: General;  Laterality: Right;  . CATARACT EXTRACTION  2013   both  . CYSTOCELE REPAIR  2008  . EYE SURGERY     cataracts-plugs for eyes  . RECTOCELE REPAIR  2008  . TONSILLECTOMY      There were no vitals filed for this visit.   Subjective Assessment - 12/24/17 0853    Subjective  Patient reports that the weekend after Christmas she was staying at her  daughter's house and had to go up and down stairs, reports doing at least 4x/day, she started feeling pain that weekend, reports that she has had pain since that time.  Feels like it is a little better with her using ice.    Limitations  Standing;House hold activities    Patient Stated Goals  go up and down stairs, walk normally, have less pain    Currently in Pain?  Yes    Pain Score  5     Pain Location  Knee    Pain Orientation  Right    Pain Descriptors / Indicators  Tightness    Pain Type  Acute pain    Pain Onset  1 to 4 weeks ago    Pain Frequency  Constant    Aggravating Factors   bending it, stairs, up on feet up to 10/10    Pain Relieving Factors  morning, ice, rest, 3/10 at best    Effect of Pain on Daily Activities  difficulty standing, walking, doing housework         [facility] PT Assessment - 12/24/17 0001      Assessment   Medical Diagnosis  Right knee pain    Referring Provider  W. Addison Lank  Onset Date/Surgical Date  11/27/17    Prior Therapy  for back 1-2 years ago      Precautions   Precautions  None      Balance Screen   Has the patient fallen in the past 6 months  Yes    How many times?  1    Has the patient had a decrease in activity level because of a fear of falling?   No    Is the patient reluctant to leave their home because of a fear of falling?   No      Home Environment   Additional Comments  lives alone, does her own housework      Prior Function   Level of Independence  Independent    Vocation  Retired    Leisure  yoga, some gym exercises has stopped due to knee pain      ROM / Strength   AROM / PROM / Strength  AROM;PROM;Strength      AROM   AROM Assessment Site  Knee    Right/Left Knee  Right    Right Knee Extension  10    Right Knee Flexion  90 pain anterior      PROM   PROM Assessment Site  Knee    Right/Left Knee  Right    Right Knee Extension  5    Right Knee Flexion  98 pain anterior      Strength   Overall Strength Comments   4-/5 with pain       Palpation   Palpation comment  tender patellar area, non tender MCL area, some tenderness laterally      Ambulation/Gait   Gait Comments  mild antalgic gait on the right, shortened stance phase             Objective measurements completed on examination: See above findings.      [facility] Adult PT Treatment/Exercise - 12/24/17 0001      Iontophoresis   Type of Iontophoresis  Dexamethasone    Location  right patellar tendon area    Dose  54mA    Time  4 hour patch             PT Education - 12/24/17 1119    Education provided  Yes    Education Details  calf stretches    Person(s) Educated  Patient    Methods  Explanation;Demonstration    Comprehension  Verbalized understanding       PT Short Term Goals - 12/24/17 1123      PT SHORT TERM GOAL #1   Title  independent with initial HEP    Time  2    Period  Weeks    Status  New        PT Long Term Goals - 12/24/17 1123      PT LONG TERM GOAL #1   Title  decrease pain 50%    Time  8    Period  Weeks    Status  New      PT LONG TERM GOAL #2   Title  increase ROM of the right knee to 0-115 degrees flexion    Time  8    Period  Weeks    Status  New      PT LONG TERM GOAL #3   Title  go up and down stairs without difficulty    Time  8    Period  Weeks    Status  New  PT LONG TERM GOAL #4   Title  report able to return to yoga without difficulty    Time  8    Period  Weeks    Status  New             Plan - 12/24/17 1119    Clinical Impression Statement  Patient reports that she started having right knee pain when she was staying at her daughters house and her bedroom was upstairs and she went up and down the stairs multiple times a day.  She reports pain in the anterior knee.  No imaging has been done, she reports that she feels like the knee moves sideways at times.  Her AROM was 10-90 degrees with pain.  Reports that she has difficulty with stairs and was going up  and down one at a time.    Clinical Presentation  Stable    Clinical Decision Making  Low    Rehab Potential  Good    PT Frequency  2x / week    PT Duration  8 weeks    PT Treatment/Interventions  ADLs/Self Care Home Management;Electrical Stimulation;Cryotherapy;Moist Heat;Therapeutic exercise;Therapeutic activities;Ultrasound;Manual techniques;Taping;Patient/family education;Iontophoresis 4mg /ml Dexamethasone    PT Next Visit Plan  seems to have some patellar tendonitis, lateral tracking patella    Consulted and Agree with Plan of Care  Patient       Patient will benefit from skilled therapeutic intervention in order to improve the following deficits and impairments:  Decreased activity tolerance, Decreased mobility, Decreased strength, Decreased range of motion, Difficulty walking, Impaired flexibility, Impaired perceived functional ability, Pain, Abnormal gait  Visit Diagnosis: Acute pain of right knee - Plan: PT plan of care cert/re-cert  Stiffness of right knee, not elsewhere classified - Plan: PT plan of care cert/re-cert  Difficulty in walking, not elsewhere classified - Plan: PT plan of care cert/re-cert     Problem List Patient Active Problem List   Diagnosis Date Noted  . Thyroid nodule 02/11/2017  . Osteopenia 07/18/2016  . Chronic anticoagulation 12/29/2014  . Encounter for monitoring flecainide therapy 12/29/2014  . Paroxysmal atrial fibrillation ([place]) 12/29/2014  . Postmenopausal estrogen deficiency 01/27/2014  . Hot flashes, menopausal 01/27/2014  . Malignant neoplasm of upper-outer quadrant of right breast in female, estrogen receptor positive ([place]) 11/17/2013    [name], PT 12/24/2017, 11:27 AM  [place], [place], [address] Phone: [phone]   Fax:  [phone]  Name: Katherine Powell MRN: [id number] Date of Birth: 24-Oct-1932

## 2017-12-30 ENCOUNTER — Ambulatory Visit: Payer: PPO | Admitting: Physical Therapy

## 2018-01-01 ENCOUNTER — Ambulatory Visit: Payer: PPO | Admitting: Physical Therapy

## 2018-01-01 ENCOUNTER — Encounter: Payer: Self-pay | Admitting: Physical Therapy

## 2018-01-01 DIAGNOSIS — R922 Inconclusive mammogram: Secondary | ICD-10-CM | POA: Diagnosis not present

## 2018-01-01 DIAGNOSIS — Z853 Personal history of malignant neoplasm of breast: Secondary | ICD-10-CM | POA: Diagnosis not present

## 2018-01-01 DIAGNOSIS — R262 Difficulty in walking, not elsewhere classified: Secondary | ICD-10-CM

## 2018-01-01 DIAGNOSIS — M25661 Stiffness of right knee, not elsewhere classified: Secondary | ICD-10-CM

## 2018-01-01 DIAGNOSIS — M25561 Pain in right knee: Secondary | ICD-10-CM

## 2018-01-01 NOTE — Therapy (Signed)
[place], [place], [address] Phone: [phone]   Fax:  [phone]  Physical Therapy Treatment  Patient Details  Name: Katherine Powell MRN: [id number] Date of Birth: 06-25-1932 Referring Provider: W. Addison Lank   Encounter Date: 01/01/2018  PT End of Session - 01/01/18 0909    Visit Number  2    Date for PT Re-Evaluation  02/21/18    PT Start Time  0843    PT Stop Time  0930    PT Time Calculation (min)  47 min    Activity Tolerance  Patient tolerated treatment well    Behavior During Therapy  [facility] for tasks assessed/performed       Past Medical History:  Diagnosis Date  . Anxiety   . Arrhythmia    atrial fibrillation/  on   . Arthritis   . Atrial fibrillation ([place])   . Breast cancer ([place]) 11/09/13   right upper outer  . Contact lens/glasses fitting    contact rt eye  . Hx of radiation therapy 12/15/13- 01/11/14   right breast 4250 cGy in 17 sessions, (hypo-fractionated), no boost  . Hypertension   . Postmenopausal   . Rosacea   . Seasonal allergies   . Sleep disturbance   . Thyroid disease   . Urinary incontinence     Past Surgical History:  Procedure Laterality Date  . ABDOMINAL HYSTERECTOMY  1973   endometriosis  . APPENDECTOMY  1954  . BACK SURGERY     lumb-2005  . BILATERAL OOPHORECTOMY     benign tumor on ovary  . BREAST LUMPECTOMY WITH NEEDLE LOCALIZATION Right 11/09/2013   Procedure: BREAST LUMPECTOMY WITH NEEDLE LOCALIZATION;  Surgeon: Harl Bowie, MD;  Location: [facility];  Service: General;  Laterality: Right;  . CATARACT EXTRACTION  2013   both  . CYSTOCELE REPAIR  2008  . EYE SURGERY     cataracts-plugs for eyes  . RECTOCELE REPAIR  2008  . TONSILLECTOMY      There were no vitals filed for this visit.  Subjective Assessment - 01/01/18 0904    Subjective  Patient reports that she went to [facility] after our evaluation and she was in a lot  more pain, reports ice helps.  Limping more today on the right    Currently in Pain?  Yes    Pain Score  6     Pain Location  Knee    Pain Orientation  Right;Anterior;Medial;Lateral                      [facility] Adult PT Treatment/Exercise - 01/01/18 0001      Ambulation/Gait   Gait Comments  increased antalgic today on the right      Exercises   Exercises  Knee/Hip      Knee/Hip Exercises: Stretches   Passive Hamstring Stretch  3 reps;20 seconds    Quad Stretch  3 reps;30 seconds    Hip Flexor Stretch  3 reps;30 seconds    ITB Stretch  3 reps;20 seconds    Gastroc Stretch  3 reps;20 seconds      Knee/Hip Exercises: Aerobic   Recumbent Bike  5 minutes started with partial revolutions      Knee/Hip Exercises: Supine   Quad Sets  2 sets;10 reps    Short Arc Quad Sets  10 reps;2 sets    Short Arc Quad Sets Limitations  2.5# with some manual  assist to decrease lateral tracking patella      Modalities   Modalities  Cryotherapy;Moist Heat      Moist Heat Therapy   Number Minutes Moist Heat  10 Minutes    Moist Heat Location  Other (comment) right thigh      Cryotherapy   Number Minutes Cryotherapy  10 Minutes    Cryotherapy Location  Knee    Type of Cryotherapy  Ice pack      Manual Therapy   Manual therapy comments  kinesio tape of the right patella a light unloading with some lateral support to prevent lateral tracking               PT Short Term Goals - 01/01/18 6195      PT SHORT TERM GOAL #1   Title  independent with initial HEP    Status  Partially Met        PT Long Term Goals - 12/24/17 1123      PT LONG TERM GOAL #1   Title  decrease pain 50%    Time  8    Period  Weeks    Status  New      PT LONG TERM GOAL #2   Title  increase ROM of the right knee to 0-115 degrees flexion    Time  8    Period  Weeks    Status  New      PT LONG TERM GOAL #3   Title  go up and down stairs without difficulty    Time  8    Period  Weeks     Status  New      PT LONG TERM GOAL #4   Title  report able to return to yoga without difficulty    Time  8    Period  Weeks    Status  New            Plan - 01/01/18 0909    Clinical Impression Statement  Patient is very tight in the calf, the ITB, the HS and the quads.  She has lateral tracking patella, she is very tender along the patella and in the quad and ITB area.  I tried tape today and she like the "feeling of support"    PT Next Visit Plan  see if the tape helped    Consulted and Agree with Plan of Care  Patient       Patient will benefit from skilled therapeutic intervention in order to improve the following deficits and impairments:  Decreased activity tolerance, Decreased mobility, Decreased strength, Decreased range of motion, Difficulty walking, Impaired flexibility, Impaired perceived functional ability, Pain, Abnormal gait  Visit Diagnosis: Acute pain of right knee  Stiffness of right knee, not elsewhere classified  Difficulty in walking, not elsewhere classified     Problem List Patient Active Problem List   Diagnosis Date Noted  . Thyroid nodule 02/11/2017  . Osteopenia 07/18/2016  . Chronic anticoagulation 12/29/2014  . Encounter for monitoring flecainide therapy 12/29/2014  . Paroxysmal atrial fibrillation ([place]) 12/29/2014  . Postmenopausal estrogen deficiency 01/27/2014  . Hot flashes, menopausal 01/27/2014  . Malignant neoplasm of upper-outer quadrant of right breast in female, estrogen receptor positive ([place]) 11/17/2013    [name], PT 01/01/2018, 9:29 AM  [place], [place], [address] Phone: [phone]   Fax:  [phone]  Name: Katherine Powell MRN: [id number] Date of  Birth: Dec 18, 1931

## 2018-01-03 ENCOUNTER — Encounter: Payer: Self-pay | Admitting: Physical Therapy

## 2018-01-03 ENCOUNTER — Ambulatory Visit: Payer: PPO | Attending: Family Medicine | Admitting: Physical Therapy

## 2018-01-03 DIAGNOSIS — R262 Difficulty in walking, not elsewhere classified: Secondary | ICD-10-CM | POA: Insufficient documentation

## 2018-01-03 DIAGNOSIS — M25561 Pain in right knee: Secondary | ICD-10-CM | POA: Diagnosis not present

## 2018-01-03 DIAGNOSIS — M25661 Stiffness of right knee, not elsewhere classified: Secondary | ICD-10-CM | POA: Insufficient documentation

## 2018-01-03 NOTE — Therapy (Signed)
[place], [place], [address] Phone: [phone]   Fax:  [phone]  Physical Therapy Treatment  Patient Details  Name: Katherine Powell MRN: [id number] Date of Birth: Oct 15, 1932 Referring Provider: W. Addison Lank   Encounter Date: 01/03/2018  PT End of Session - 01/03/18 1205    Visit Number  3    Date for PT Re-Evaluation  02/21/18    PT Start Time  0843    PT Stop Time  0933    PT Time Calculation (min)  50 min    Activity Tolerance  Patient tolerated treatment well    Behavior During Therapy  [facility] for tasks assessed/performed       Past Medical History:  Diagnosis Date  . Anxiety   . Arrhythmia    atrial fibrillation/  on   . Arthritis   . Atrial fibrillation ([place])   . Breast cancer ([place]) 11/09/13   right upper outer  . Contact lens/glasses fitting    contact rt eye  . Hx of radiation therapy 12/15/13- 01/11/14   right breast 4250 cGy in 17 sessions, (hypo-fractionated), no boost  . Hypertension   . Postmenopausal   . Rosacea   . Seasonal allergies   . Sleep disturbance   . Thyroid disease   . Urinary incontinence     Past Surgical History:  Procedure Laterality Date  . ABDOMINAL HYSTERECTOMY  1973   endometriosis  . APPENDECTOMY  1954  . BACK SURGERY     lumb-2005  . BILATERAL OOPHORECTOMY     benign tumor on ovary  . BREAST LUMPECTOMY WITH NEEDLE LOCALIZATION Right 11/09/2013   Procedure: BREAST LUMPECTOMY WITH NEEDLE LOCALIZATION;  Surgeon: Harl Bowie, MD;  Location: [facility];  Service: General;  Laterality: Right;  . CATARACT EXTRACTION  2013   both  . CYSTOCELE REPAIR  2008  . EYE SURGERY     cataracts-plugs for eyes  . RECTOCELE REPAIR  2008  . TONSILLECTOMY      There were no vitals filed for this visit.  Subjective Assessment - 01/03/18 1012    Subjective  Patient reports that she is still hurting but reports that she can get in and out of  the car easier    Currently in Pain?  Yes    Pain Score  5     Pain Location  Knee    Pain Orientation  Right    Aggravating Factors   bending                      [facility] Adult PT Treatment/Exercise - 01/03/18 0001      Knee/Hip Exercises: Stretches   Passive Hamstring Stretch  3 reps;20 seconds    Quad Stretch  3 reps;30 seconds    Hip Flexor Stretch  3 reps;30 seconds    ITB Stretch  3 reps;20 seconds    Gastroc Stretch  3 reps;20 seconds      Knee/Hip Exercises: Aerobic   Recumbent Bike  5 minutes started with partial revolutions    Nustep  level 3 x 5 minutes      Knee/Hip Exercises: Supine   Quad Sets  2 sets;10 reps    Short Arc Quad Sets  10 reps;3 sets    Short Arc Quad Sets Limitations  2.5# with       Knee/Hip Exercises: Sidelying   Hip ADduction  2 sets;10 reps  Moist Heat Therapy   Number Minutes Moist Heat  15 Minutes    Moist Heat Location  Other (comment) thigh      Cryotherapy   Number Minutes Cryotherapy  15 Minutes    Cryotherapy Location  Knee    Type of Cryotherapy  Ice pack      Manual Therapy   Manual therapy comments  kinesio tape of the right patella a light unloading with some lateral support to prevent lateral tracking               PT Short Term Goals - 01/01/18 4259      PT SHORT TERM GOAL #1   Title  independent with initial HEP    Status  Partially Met        PT Long Term Goals - 12/24/17 1123      PT LONG TERM GOAL #1   Title  decrease pain 50%    Time  8    Period  Weeks    Status  New      PT LONG TERM GOAL #2   Title  increase ROM of the right knee to 0-115 degrees flexion    Time  8    Period  Weeks    Status  New      PT LONG TERM GOAL #3   Title  go up and down stairs without difficulty    Time  8    Period  Weeks    Status  New      PT LONG TERM GOAL #4   Title  report able to return to yoga without difficulty    Time  8    Period  Weeks    Status  New            Plan -  01/03/18 1206    Clinical Impression Statement  Patient very tight and painful when starting the bike had to do partial revolutions, then after about 2 minutes can do full, c/o pain in the thigh and the superior patella area.  Needs cues to go slow on exercises especially to get TKE for better VMO    PT Next Visit Plan  may teach self taping as she feels this helped    Consulted and Agree with Plan of Care  Patient       Patient will benefit from skilled therapeutic intervention in order to improve the following deficits and impairments:  Decreased activity tolerance, Decreased mobility, Decreased strength, Decreased range of motion, Difficulty walking, Impaired flexibility, Impaired perceived functional ability, Pain, Abnormal gait  Visit Diagnosis: Acute pain of right knee  Stiffness of right knee, not elsewhere classified  Difficulty in walking, not elsewhere classified     Problem List Patient Active Problem List   Diagnosis Date Noted  . Thyroid nodule 02/11/2017  . Osteopenia 07/18/2016  . Chronic anticoagulation 12/29/2014  . Encounter for monitoring flecainide therapy 12/29/2014  . Paroxysmal atrial fibrillation ([place]) 12/29/2014  . Postmenopausal estrogen deficiency 01/27/2014  . Hot flashes, menopausal 01/27/2014  . Malignant neoplasm of upper-outer quadrant of right breast in female, estrogen receptor positive ([place]) 11/17/2013    [name], PT 01/03/2018, 12:08 PM  [place], [place], [address] Phone: [phone]   Fax:  [phone]  Name: Katherine Powell MRN: [id number] Date of Birth: Mar 30, 1932

## 2018-01-13 ENCOUNTER — Encounter: Payer: Self-pay | Admitting: Physical Therapy

## 2018-01-13 ENCOUNTER — Ambulatory Visit: Payer: PPO | Admitting: Physical Therapy

## 2018-01-13 DIAGNOSIS — R262 Difficulty in walking, not elsewhere classified: Secondary | ICD-10-CM

## 2018-01-13 DIAGNOSIS — M25561 Pain in right knee: Secondary | ICD-10-CM | POA: Diagnosis not present

## 2018-01-13 DIAGNOSIS — M25661 Stiffness of right knee, not elsewhere classified: Secondary | ICD-10-CM

## 2018-01-13 NOTE — Therapy (Signed)
During this treatment session, the therapist was present, participating in and directing the treatment. [facility] [place], [place], [address] Phone: [phone]   Fax:  [phone]  Physical Therapy Treatment  Patient Details  Name: Katherine Powell MRN: [id number] Date of Birth: [birth year] Referring Provider: W. Addison Lank   Encounter Date: 01/13/2018  PT End of Session - 01/13/18 1057    Visit Number  4    Date for PT Re-Evaluation  02/21/18    PT Start Time  1020    PT Stop Time  1114    PT Time Calculation (min)  54 min    Activity Tolerance  Patient tolerated treatment well    Behavior During Therapy  [facility] for tasks assessed/performed       Past Medical History:  Diagnosis Date  . Anxiety   . Arrhythmia    atrial fibrillation/  on   . Arthritis   . Atrial fibrillation ([place])   . Breast cancer ([place]) 11/09/13   right upper outer  . Contact lens/glasses fitting    contact rt eye  . Hx of radiation therapy 12/15/13- 01/11/14   right breast 4250 cGy in 17 sessions, (hypo-fractionated), no boost  . Hypertension   . Postmenopausal   . Rosacea   . Seasonal allergies   . Sleep disturbance   . Thyroid disease   . Urinary incontinence     Past Surgical History:  Procedure Laterality Date  . ABDOMINAL HYSTERECTOMY  1973   endometriosis  . APPENDECTOMY  1954  . BACK SURGERY     lumb-2005  . BILATERAL OOPHORECTOMY     benign tumor on ovary  . BREAST LUMPECTOMY WITH NEEDLE LOCALIZATION Right 11/09/2013   Procedure: BREAST LUMPECTOMY WITH NEEDLE LOCALIZATION;  Surgeon: Harl Bowie, MD;  Location: [facility];  Service: General;  Laterality: Right;  . CATARACT EXTRACTION  2013   both  . CYSTOCELE REPAIR  2008  . EYE SURGERY     cataracts-plugs for eyes  . RECTOCELE REPAIR  2008  . TONSILLECTOMY      There were no vitals filed for this visit.  Subjective Assessment - 01/13/18  1023    Subjective  Pt. reports feeling 'much better' since the last time we saw her. Pt. reports being able to get around and in and out of the care much easier. Pt. reports not icing as much just because she hasn't been in as much pain. Pt. reports 'ocassionally' doing the HEP at home. Pt. reports being able to turn over in bed last night for the first time without pain in a long time.  Pt. reports using bike for 10 mins with 5# resistance this AM prior to PT appt. Pt. reports liking the kinesiotape and thinking it helped.    Currently in Pain?  Yes    Pain Score  2     Pain Location  Knee    Pain Orientation  Right                      [facility] Adult PT Treatment/Exercise - 01/13/18 0001      Knee/Hip Exercises: Stretches   Passive Hamstring Stretch  3 reps;20 seconds    Quad Stretch  3 reps;30 seconds pt. very tight and has pain with passive quad stretch    ITB Stretch  3 reps;20 seconds    Gastroc Stretch  3 reps;20 seconds  Knee/Hip Exercises: Aerobic   Recumbent Bike  6 mins full revolutions    Nustep  level 3 x 5 minutes      Knee/Hip Exercises: Machines for Strengthening   Cybex Knee Extension  5# 2x10    Cybex Knee Flexion  20# 2x10    Cybex Leg Press  20# 2x10 needs lots of verbal cues to go slow      Modalities   Modalities  Cryotherapy;Moist Heat      Moist Heat Therapy   Number Minutes Moist Heat  15 Minutes    Moist Heat Location  Other (comment) R quad      Cryotherapy   Number Minutes Cryotherapy  15 Minutes    Cryotherapy Location  Knee    Type of Cryotherapy  Ice pack             PT Education - 01/13/18 1055    Education provided  Yes    Education Details  gastroc/soleus stretch, short arc/long arc quad    Person(s) Educated  Patient    Methods  Explanation;Demonstration    Comprehension  Verbalized understanding       PT Short Term Goals - 01/01/18 0928      PT SHORT TERM GOAL #1   Title  independent with initial HEP     Status  Partially Met        PT Long Term Goals - 01/13/18 1105      PT LONG TERM GOAL #1   Title  decrease pain 50%    Time  8    Period  Weeks    Status  On-going      PT LONG TERM GOAL #3   Title  go up and down stairs without difficulty    Time  8    Period  Weeks    Status  On-going            Plan - 01/13/18 1057    Clinical Impression Statement  Pt. tolerated tx well. Pt. started out on bike much easier today not needing to start with partial revolutions. Pt. did well with gym ex today reporting some pain with knee ext but feeling 'like the muscle is working'. Pt. was very tight with passive stretching especially with passive quad stretch and had some pain. Pt. like the kinesiotape last tx so we taped her again today. Pt. needed reminders on HEP because she hasn't been doing it and has forgetting about the ex/stretches. At this point, pt. feels like she will work on doing HEP and will schedule an appt if her pain increases again.    Rehab Potential  Good    PT Frequency  2x / week    PT Duration  8 weeks    PT Treatment/Interventions  ADLs/Self Care Home Management;Electrical Stimulation;Cryotherapy;Moist Heat;Therapeutic exercise;Therapeutic activities;Ultrasound;Manual techniques;Taping;Patient/family education;Iontophoresis '4mg'$ /ml Dexamethasone    PT Next Visit Plan  continue strengthening and work on increasing knee ROM     PT Home Exercise Plan  gastroc/soleus stretch    Consulted and Agree with Plan of Care  Patient       Patient will benefit from skilled therapeutic intervention in order to improve the following deficits and impairments:  Decreased activity tolerance, Decreased mobility, Decreased strength, Decreased range of motion, Difficulty walking, Impaired flexibility, Impaired perceived functional ability, Pain, Abnormal gait  Visit Diagnosis: Acute pain of right knee  Stiffness of right knee, not elsewhere classified  Difficulty in walking, not  elsewhere classified  Problem List Patient Active Problem List   Diagnosis Date Noted  . Thyroid nodule 02/11/2017  . Osteopenia 07/18/2016  . Chronic anticoagulation 12/29/2014  . Encounter for monitoring flecainide therapy 12/29/2014  . Paroxysmal atrial fibrillation ([place]) 12/29/2014  . Postmenopausal estrogen deficiency 01/27/2014  . Hot flashes, menopausal 01/27/2014  . Malignant neoplasm of upper-outer quadrant of right breast in female, estrogen receptor positive ([place]) 11/17/2013    Juliann Pulse SPT 01/13/2018, 11:19 AM  [facility], [place], [address] Phone: [phone]   Fax:  [phone]  Name: Katherine Powell MRN: [id number] Date of Birth: 1932-10-01

## 2018-01-14 NOTE — Progress Notes (Signed)
[facility]  [phone] [phone]   ID: [name] OB: 28-Jun-1932  MR#: [id number]  XBM#:[id number]  Patient Care Team: Cari Caraway, MD as PCP - General (Family Medicine) Makalia Bare, Virgie Dad, MD as Consulting Physician (Oncology) Coralie Keens, MD as Consulting Physician (General Surgery) Arloa Koh, MD as Consulting Physician (Radiation Oncology) Christene Slates, MD as Physician Assistant (Radiology) McDiarmid, Blane Ohara, MD as Consulting Physician (Family Medicine) Belva Crome, MD as Consulting Physician (Cardiology)   CHIEF COMPLAINT: Estrogen receptor positive breast cancer  Current treatment: Anastrozole, denosumab/Prolia  HISTORY OF PRESENT ILLNESS: From the original intake note:  Angelene had routine screening mammography [facility] 10/14/2013 showing a 1 cm irregular mass in the right breast. Right digital mammography and ultrasonography of the right breast 10/21/2013 showed a mass with indistinct margins at the 12:00 position which by ultrasound measured 1 cm, and was lobulated. Biopsy was recommended, but the patient opted to proceed directly with lumpectomy, and this was performed 11/09/2013. The final pathology ([id number]) showed an invasive ductal carcinoma, grade 2, measuring 1.1 cm, with low-grade ductal carcinoma in situ. The tumor was estrogen receptor 100% positive, and progesterone receptor 94% positive, both with strong staining intensity. The MIB-1 was 10%. There was no HER-2 amplification with a ratio by CISH of 0.90, and the number per cell being 1.80.  The patient's subsequent history is as detailed below   INTERVAL HISTORY: Alean returns today for follow-up of her estrogen receptor positive breast cancer.  She continues on anastrozole, with essentially no side effects that she is aware of.  In addition she receives denosumab/Prolia every 6 months.  She has had no problems with that "so long as it is given  slowly".  REVIEW OF SYSTEMS: Tasheema is doing well and is in good spirits. She goes to the [facility] a few days a week, attending various classes. She does report that she was helping to take care of a friend for about 8 days and she was unable to exercise. She states that she gained about 5 pounds during that time. She denies unusual headaches, visual changes, nausea, vomiting, or dizziness. There has been no unusual cough, phlegm production, or pleurisy. This been no change in bowel or bladder habits. She denies unexplained fatigue or unexplained weight loss, bleeding, rash, or fever. A detailed review of systems was otherwise noncontributory.   PAST MEDICAL HISTORY: Past Medical History:  Diagnosis Date  . Anxiety   . Arrhythmia    atrial fibrillation/  on   . Arthritis   . Atrial fibrillation ([place])   . Breast cancer ([place]) 11/09/13   right upper outer  . Contact lens/glasses fitting    contact rt eye  . Hx of radiation therapy 12/15/13- 01/11/14   right breast 4250 cGy in 17 sessions, (hypo-fractionated), no boost  . Hypertension   . Postmenopausal   . Rosacea   . Seasonal allergies   . Sleep disturbance   . Thyroid disease   . Urinary incontinence     PAST SURGICAL HISTORY: Past Surgical History:  Procedure Laterality Date  . ABDOMINAL HYSTERECTOMY  1973   endometriosis  . APPENDECTOMY  1954  . BACK SURGERY     lumb-2005  . BILATERAL OOPHORECTOMY     benign tumor on ovary  . BREAST LUMPECTOMY WITH NEEDLE LOCALIZATION Right 11/09/2013   Procedure: BREAST LUMPECTOMY WITH NEEDLE LOCALIZATION;  Surgeon: Harl Bowie, MD;  Location: [facility];  Service: General;  Laterality: Right;  .  CATARACT EXTRACTION  2013   both  . CYSTOCELE REPAIR  2008  . EYE SURGERY     cataracts-plugs for eyes  . RECTOCELE REPAIR  2008  . TONSILLECTOMY      FAMILY HISTORY Family History  Problem Relation Age of Onset  . Alzheimer's disease Mother   . Stroke Father   . Heart  attack Neg Hx    the patient's father died from COPD at age 27 in the setting of tobacco abuse. The patient's mother died at the age of 71 with Alzheimer's disease. The patient had no brothers, one sister. There is no history of breast or ovarian cancer in the family to her knowledge  GYNECOLOGIC HISTORY:  Menarche age 42, first live birth age 54. The patient is GX P3. She underwent simple hysterectomy in 1973 and took hormone replacement (initially Prempro later estrogens only) until December 2014. 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 Her husband Deidre Ala was an Chief Financial Officer for [employer]. He died 2015/05/17 from prostate cancer.Their son Christia Reading lives in [place]. where he works in Engineer, technical sales. Daughter Fanny Bien lives in [place] where she works as a Designer, jewellery in an independent clinic. Daughter Norberto Sorenson liives in  [place]. She works as a Tree surgeon. The patient has 7 grandchildren, 2 of whom are getting married in the winter of 2016. She is a Tourist information centre manager.    ADVANCED DIRECTIVES: In place; the patient has named her daughter, Fanny Bien, is a Designer, jewellery, as her healthcare power of attorney. And can be reached at [phone].   HEALTH MAINTENANCE: Social History   Tobacco Use  . Smoking status: Never Smoker  . Smokeless tobacco: Never Used  Substance Use Topics  . Alcohol use: No  . Drug use: No     Colonoscopy:  PAP:  Bone density: At [facility] 09/27/2010, normal  Lipid panel:  Allergies  Allergen Reactions  . Codeine Nausea Only  . Gabapentin Other (See Comments)    Makes her very sleepy    Current Outpatient Medications  Medication Sig Dispense Refill  . acetaminophen (TYLENOL) 650 MG CR tablet Take 650 mg by mouth every 8 (eight) hours as needed for pain. Reported on  01/19/2016    . ALPRAZolam (XANAX) 0.5 MG tablet Take 0.5 mg by mouth at bedtime as needed for anxiety.    [name] anastrozole (ARIMIDEX) 1 MG tablet TAKE 1 TABLET BY MOUTH DAILY 90 tablet 3  . apixaban (ELIQUIS) 5 MG TABS tablet Take 1 tablet (5 mg total) by mouth 2 (two) times daily. 60 tablet   . Calcium-Phosphorus-Vitamin D (CITRACAL +D3 PO) Take 1 tablet by mouth daily.    [name] CARTIA XT 120 MG 24 hr capsule Take 120 mg by mouth every evening.     . cholecalciferol (VITAMIN D) 400 UNITS TABS tablet Take 400 Units by mouth 2 (two) times daily.     . cloNIDine (CATAPRES) 0.1 MG tablet TAKE 1 TABLET (0.1 MG TOTAL) BY MOUTH 2 (TWO) TIMES DAILY AS NEEDED. 180 tablet 0  . cycloSPORINE (RESTASIS) 0.05 % ophthalmic emulsion Place 1 drop into both eyes 2 (two) times daily.     . fexofenadine (ALLEGRA) 180 MG tablet Take 180 mg by mouth continuous as needed for allergies or rhinitis.    . flecainide (TAMBOCOR) 100 MG tablet TAKE  ONE TABLET BY MOUTH TWICE DAILY  180 tablet 2  . fluticasone (FLONASE) 50 MCG/ACT nasal spray Place 1 spray into both nostrils daily as needed for allergies. Reported on 01/19/2016    . glucosamine-chondroitin 500-400 MG tablet Take 1 tablet by mouth 2 (two) times daily.    . Omega-3 Fatty Acids (EQL OMEGA 3 FISH OIL) 1400 MG CAPS Take 1 capsule by mouth daily.    . Risedronate Sodium (ACTONEL PO) Take 1 tablet by mouth daily.     . sodium chloride (OCEAN) 0.65 % nasal spray Place 1 spray into the nose as needed for congestion. Reported on 01/19/2016    . trolamine salicylate (ASPERCREME) 10 % cream Apply 1 application topically as needed for muscle pain.     No current facility-administered medications for this visit.     OBJECTIVE: Older white woman in no acute distress  Vitals:   01/15/18 1325  BP: 111/84  Pulse: (!) 57  Resp: 18  Temp: 97.6 F (36.4 C)  SpO2: 100%     Body mass index is 24.92 kg/m.    ECOG FS:1 - Symptomatic but completely ambulatory  Sclerae unicteric,  EOMs intact Oropharynx clear and moist No cervical or supraclavicular adenopathy Lungs no rales or rhonchi Heart regular rate and rhythm Abd soft, nontender, positive bowel sounds MSK no focal spinal tenderness, no upper extremity lymphedema Neuro: nonfocal, well oriented, appropriate affect Breasts: On the right the patient's has undergone lumpectomy and radiation, with no evidence of recurrence.  The cosmetic result is excellent.  The left breast is unremarkable.  Both axilla are benign.  LAB RESULTS:  CMP     Component Value Date/Time   NA 137 07/25/2017 1120   K 4.2 07/25/2017 1120   CL 103 01/17/2017 1626   CO2 26 07/25/2017 1120   GLUCOSE 80 07/25/2017 1120   BUN 20.9 07/25/2017 1120   CREATININE 1.0 07/25/2017 1120   CALCIUM 9.3 07/25/2017 1120   PROT 6.5 07/25/2017 1120   ALBUMIN 3.8 07/25/2017 1120   AST 18 07/25/2017 1120   ALT 12 07/25/2017 1120   ALKPHOS 51 07/25/2017 1120   BILITOT 0.41 07/25/2017 1120   GFRNONAA >60 01/17/2017 1626   GFRAA >60 01/17/2017 1626    I No results found for: SPEP  Lab Results  Component Value Date   WBC 5.1 01/15/2018   NEUTROABS 3.3 01/15/2018   HGB 11.8 01/15/2018   HCT 35.2 01/15/2018   MCV 94.7 01/15/2018   PLT 199 01/15/2018      Chemistry      Component Value Date/Time   NA 137 07/25/2017 1120   K 4.2 07/25/2017 1120   CL 103 01/17/2017 1626   CO2 26 07/25/2017 1120   BUN 20.9 07/25/2017 1120   CREATININE 1.0 07/25/2017 1120      Component Value Date/Time   CALCIUM 9.3 07/25/2017 1120   ALKPHOS 51 07/25/2017 1120   AST 18 07/25/2017 1120   ALT 12 07/25/2017 1120   BILITOT 0.41 07/25/2017 1120       No results found for: LABCA2  No components found for: LABCA125  No results for input(s): INR in the last 168 hours.  Urinalysis    Component Value Date/Time   COLORURINE STRAW (A) 01/17/2017 1835    STUDIES: Mammography earlier this month reportedly benign--I do not have the actual report today    ASSESSMENT: 84 y.o. Katherine Powell woman  (1) Status post right Breast upper outer quadrant lumpectomy 11/09/2013 for a pT1c NX,  stage IA invasive ductal carcinoma, grade 2, estrogen receptor 100% positive, progesterone receptor 94% positive, with an MIB-1 of 10%, and no HER-2 amplification.  (2) adjuvant radiation completed February 2015.  (3) anastrozole started February 2015  (a) bone density at [facility] February 2015 shows mild osteopenia with a T score of -1.3  (b) bone density at [facility] 03/29/2016 shows a T score of -1.7.   (a) started denosumab/Prolia 07/25/2016, repeated every 6 months  (4) the patient is status post hysterectomy and bilateral salpingo-oophorectomy  (5) atrial fibrillation, on Rivaroxaban  PLAN: Garrett is now a little over 4 years out from definitive surgery for her breast cancer with no evidence of disease recurrence.  This is very favorable.  She continues on anastrozole, with excellent tolerance.  She does have osteopenia, and we are treating her with denosumab/Prolia, which she tolerates well.  She had risedronate/Actonel on her medication list and she thought she was taking it daily.  I really do not think that she was on it.  She is going to check her medication list and make sure that it is not there but if it is she will ask is how to take it.  Otherwise she will return to see me in 1 year.  She will receive her last Prolia shot on that day and she will have a bone density shortly before that I expect at that point she will "graduate" from follow-up  She knows to call for any other issues that may develop before her next visit. Janashia Parco, Virgie Dad, MD  01/15/18 1:39 PM Medical Oncology and Hematology [facility] [address] [place], [place] [address] Tel. [phone]    Fax. [phone]  This document serves as a record of services personally performed by Chauncey Cruel, MD. It was created on his behalf by Margit Banda, a trained  medical scribe. The creation of this record is based on the scribe's personal observations and the provider's statements to them.   I have reviewed the above documentation for accuracy and completeness, and I agree with the above.

## 2018-01-15 ENCOUNTER — Inpatient Hospital Stay: Payer: PPO | Attending: Oncology | Admitting: Oncology

## 2018-01-15 ENCOUNTER — Inpatient Hospital Stay: Payer: PPO

## 2018-01-15 ENCOUNTER — Telehealth: Payer: Self-pay | Admitting: Oncology

## 2018-01-15 VITALS — BP 111/84 | HR 57 | Temp 97.6°F | Resp 18 | Ht 67.0 in | Wt 159.1 lb

## 2018-01-15 DIAGNOSIS — Z79811 Long term (current) use of aromatase inhibitors: Secondary | ICD-10-CM | POA: Diagnosis not present

## 2018-01-15 DIAGNOSIS — Z170 Estrogen receptor positive status [ER+]: Secondary | ICD-10-CM | POA: Diagnosis not present

## 2018-01-15 DIAGNOSIS — C50411 Malignant neoplasm of upper-outer quadrant of right female breast: Secondary | ICD-10-CM

## 2018-01-15 DIAGNOSIS — M8580 Other specified disorders of bone density and structure, unspecified site: Secondary | ICD-10-CM | POA: Diagnosis not present

## 2018-01-15 LAB — CBC WITH DIFFERENTIAL/PLATELET
Basophils Absolute: 0 10*3/uL (ref 0.0–0.1)
Basophils Relative: 1 %
Eosinophils Absolute: 0.1 10*3/uL (ref 0.0–0.5)
Eosinophils Relative: 3 %
HCT: 35.2 % (ref 34.8–46.6)
Hemoglobin: 11.8 g/dL (ref 11.6–15.9)
Lymphocytes Relative: 24 %
Lymphs Abs: 1.2 10*3/uL (ref 0.9–3.3)
MCH: 31.7 pg (ref 25.1–34.0)
MCHC: 33.5 g/dL (ref 31.5–36.0)
MCV: 94.7 fL (ref 79.5–101.0)
Monocytes Absolute: 0.4 10*3/uL (ref 0.1–0.9)
Monocytes Relative: 9 %
Neutro Abs: 3.3 10*3/uL (ref 1.5–6.5)
Neutrophils Relative %: 63 %
Platelets: 199 10*3/uL (ref 145–400)
RBC: 3.72 MIL/uL (ref 3.70–5.45)
RDW: 13.5 % (ref 11.2–14.5)
WBC: 5.1 10*3/uL (ref 3.9–10.3)

## 2018-01-15 LAB — COMPREHENSIVE METABOLIC PANEL
ALT: 10 U/L (ref 0–55)
AST: 16 U/L (ref 5–34)
Albumin: 3.9 g/dL (ref 3.5–5.0)
Alkaline Phosphatase: 58 U/L (ref 40–150)
Anion gap: 8 (ref 3–11)
BUN: 18 mg/dL (ref 7–26)
CO2: 24 mmol/L (ref 22–29)
Calcium: 9.3 mg/dL (ref 8.4–10.4)
Chloride: 103 mmol/L (ref 98–109)
Creatinine, Ser: 1.05 mg/dL (ref 0.60–1.10)
GFR calc Af Amer: 55 mL/min — ABNORMAL LOW (ref >60)
GFR calc non Af Amer: 48 mL/min — ABNORMAL LOW (ref >60)
Glucose, Bld: 92 mg/dL (ref 70–140)
Potassium: 4.8 mmol/L (ref 3.5–5.1)
Sodium: 135 mmol/L — ABNORMAL LOW (ref 136–145)
Total Bilirubin: 0.5 mg/dL (ref 0.2–1.2)
Total Protein: 6.6 g/dL (ref 6.4–8.3)

## 2018-01-15 MED ORDER — DENOSUMAB 60 MG/ML ~~LOC~~ SOLN: 60.0000 mg | Freq: Once | SUBCUTANEOUS | Status: DC

## 2018-01-15 MED ORDER — ANASTROZOLE 1 MG PO TABS: 1.0000 mg | ORAL_TABLET | Freq: Every day | ORAL | 3 refills | Status: DC

## 2018-01-15 NOTE — Patient Instructions (Signed)
Denosumab injection  What is this medicine?  DENOSUMAB (den oh sue mab) slows bone breakdown. Prolia is used to treat osteoporosis in women after menopause and in men. Xgeva is used to prevent bone fractures and other bone problems caused by cancer bone metastases. Xgeva is also used to treat giant cell tumor of the bone.  This medicine may be used for other purposes; ask your health care provider or pharmacist if you have questions.  What should I tell my health care provider before I take this medicine?  They need to know if you have any of these conditions:  -dental disease  -eczema  -infection or history of infections  -kidney disease or on dialysis  -low blood calcium or vitamin D  -malabsorption syndrome  -scheduled to have surgery or tooth extraction  -taking medicine that contains denosumab  -thyroid or parathyroid disease  -an unusual reaction to denosumab, other medicines, foods, dyes, or preservatives  -pregnant or trying to get pregnant  -breast-feeding  How should I use this medicine?  This medicine is for injection under the skin. It is given by a health care professional in a hospital or clinic setting.  If you are getting Prolia, a special MedGuide will be given to you by the pharmacist with each prescription and refill. Be sure to read this information carefully each time.  For Prolia, talk to your pediatrician regarding the use of this medicine in children. Special care may be needed. For Xgeva, talk to your pediatrician regarding the use of this medicine in children. While this drug may be prescribed for children as young as 13 years for selected conditions, precautions do apply.  Overdosage: If you think you have taken too much of this medicine contact a poison control center or emergency room at once.  NOTE: This medicine is only for you. Do not share this medicine with others.  What if I miss a dose?  It is important not to miss your dose. Call your doctor or health care professional if you are  unable to keep an appointment.  What may interact with this medicine?  Do not take this medicine with any of the following medications:  -other medicines containing denosumab  This medicine may also interact with the following medications:  -medicines that suppress the immune system  -medicines that treat cancer  -steroid medicines like prednisone or cortisone  This list may not describe all possible interactions. Give your health care provider a list of all the medicines, herbs, non-prescription drugs, or dietary supplements you use. Also tell them if you smoke, drink alcohol, or use illegal drugs. Some items may interact with your medicine.  What should I watch for while using this medicine?  Visit your doctor or health care professional for regular checks on your progress. Your doctor or health care professional may order blood tests and other tests to see how you are doing.  Call your doctor or health care professional if you get a cold or other infection while receiving this medicine. Do not treat yourself. This medicine may decrease your body's ability to fight infection.  You should make sure you get enough calcium and vitamin D while you are taking this medicine, unless your doctor tells you not to. Discuss the foods you eat and the vitamins you take with your health care professional.  See your dentist regularly. Brush and floss your teeth as directed. Before you have any dental work done, tell your dentist you are receiving this medicine.  Do   not become pregnant while taking this medicine or for 5 months after stopping it. Women should inform their doctor if they wish to become pregnant or think they might be pregnant. There is a potential for serious side effects to an unborn child. Talk to your health care professional or pharmacist for more information.  What side effects may I notice from receiving this medicine?  Side effects that you should report to your doctor or health care professional as soon as  possible:  -allergic reactions like skin rash, itching or hives, swelling of the face, lips, or tongue  -breathing problems  -chest pain  -fast, irregular heartbeat  -feeling faint or lightheaded, falls  -fever, chills, or any other sign of infection  -muscle spasms, tightening, or twitches  -numbness or tingling  -skin blisters or bumps, or is dry, peels, or red  -slow healing or unexplained pain in the mouth or jaw  -unusual bleeding or bruising  Side effects that usually do not require medical attention (Report these to your doctor or health care professional if they continue or are bothersome.):  -muscle pain  -stomach upset, gas  This list may not describe all possible side effects. Call your doctor for medical advice about side effects. You may report side effects to FDA at 1-800-FDA-1088.  Where should I keep my medicine?  This medicine is only given in a clinic, doctor's office, or other health care setting and will not be stored at home.  NOTE: This sheet is a summary. It may not cover all possible information. If you have questions about this medicine, talk to your doctor, pharmacist, or health care provider.      2016, Elsevier/Gold Standard. (2012-05-19 12:37:47)

## 2018-01-15 NOTE — Telephone Encounter (Signed)
Gave avs and calendar for February and august 2020

## 2018-01-17 ENCOUNTER — Inpatient Hospital Stay: Payer: PPO

## 2018-01-17 VITALS — BP 130/91 | HR 62 | Temp 97.7°F | Resp 18

## 2018-01-17 DIAGNOSIS — M8580 Other specified disorders of bone density and structure, unspecified site: Secondary | ICD-10-CM

## 2018-01-17 DIAGNOSIS — C50411 Malignant neoplasm of upper-outer quadrant of right female breast: Secondary | ICD-10-CM | POA: Diagnosis not present

## 2018-01-17 DIAGNOSIS — Z170 Estrogen receptor positive status [ER+]: Secondary | ICD-10-CM

## 2018-01-17 MED ORDER — DENOSUMAB 60 MG/ML ~~LOC~~ SOLN
SUBCUTANEOUS | Status: AC
  Filled 2018-01-17: qty 1

## 2018-01-17 MED ORDER — DENOSUMAB 60 MG/ML ~~LOC~~ SOLN
60.0000 mg | Freq: Once | SUBCUTANEOUS | Status: AC
  Administered 2018-01-17: 60 mg via SUBCUTANEOUS

## 2018-01-29 IMAGING — CT CT ABD-PELV W/ CM
2 of 5 series · 17 of 46 positions shown, 19 images · IV contrast (ISOVUE)
Comparison: None.

CLINICAL DATA: Diarrhea, generalized abdominal pain.

EXAM:
CT ABDOMEN AND PELVIS WITH CONTRAST
TECHNIQUE: Multidetector CT imaging of the abdomen and pelvis was performed
using the standard protocol following bolus administration of
intravenous contrast.
CONTRAST:  75mL SVLQSY-PBB IOPAMIDOL (SVLQSY-PBB) INJECTION 61%

[Series 2: abd/pel with · axial · 0.77mm/px · z∈[+906,+1272]mm · 14 of 83 slices shown, 16 images]
[im 5/83  soft-tissue]
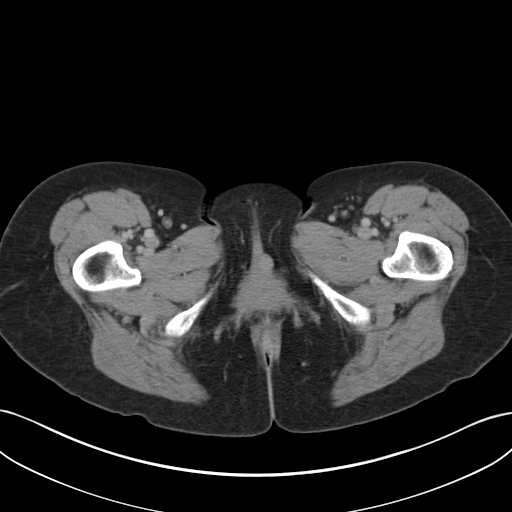
[im 5/83  bone]
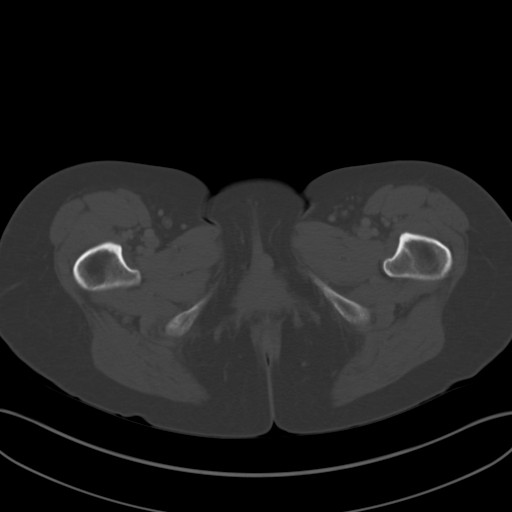
[im 10/83  soft-tissue]
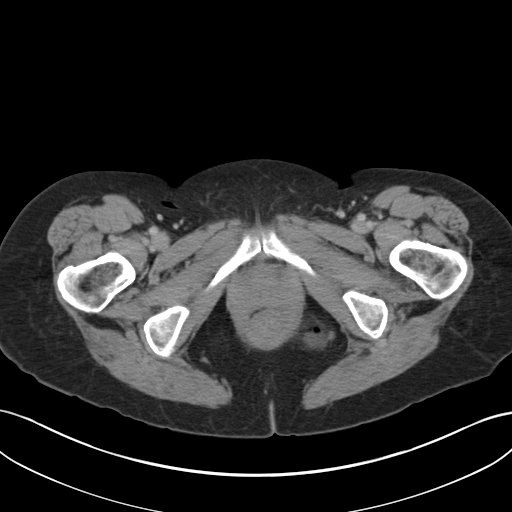
[im 15/83  soft-tissue]
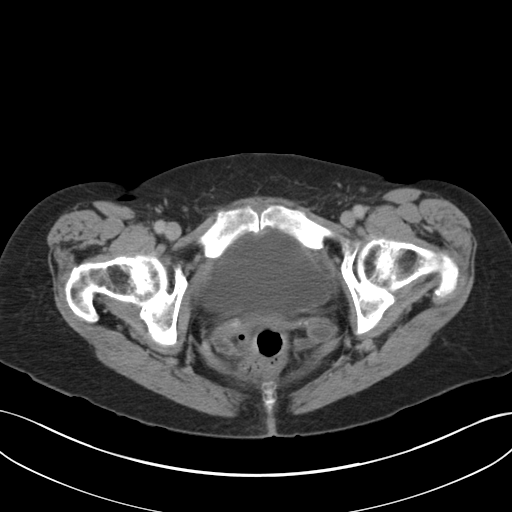
[im 25/83  soft-tissue]
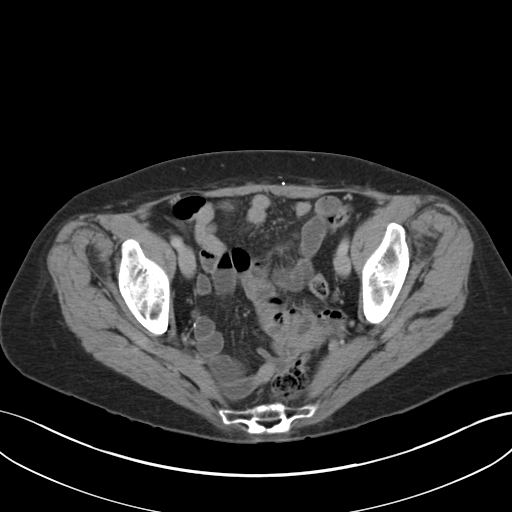
[im 29/83  soft-tissue]
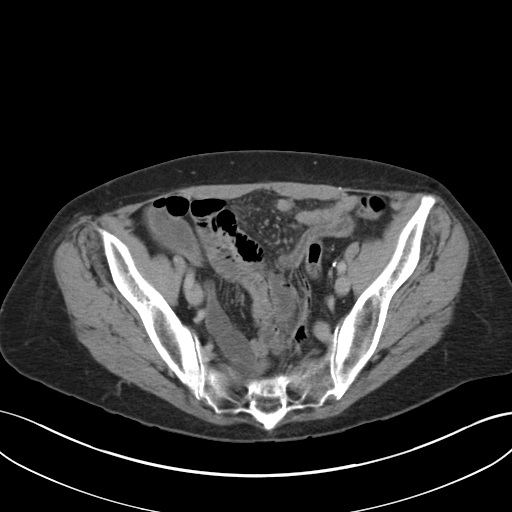
[im 34/83  soft-tissue]
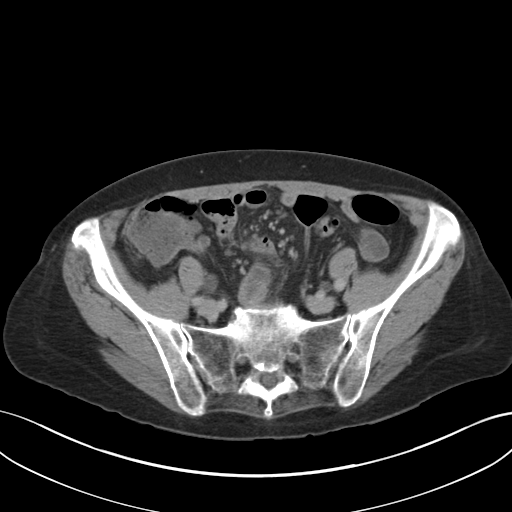
[im 39/83  soft-tissue]
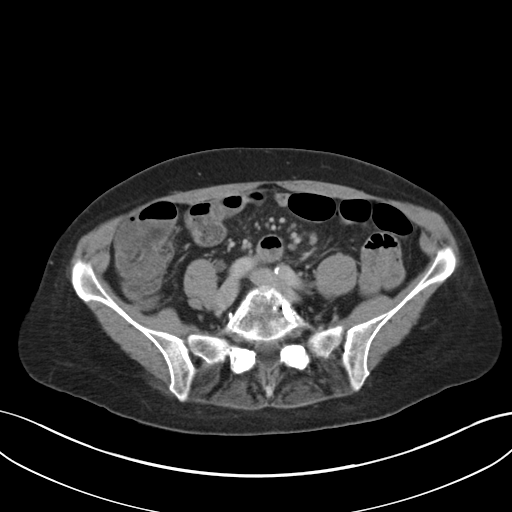
[im 44/83  soft-tissue]
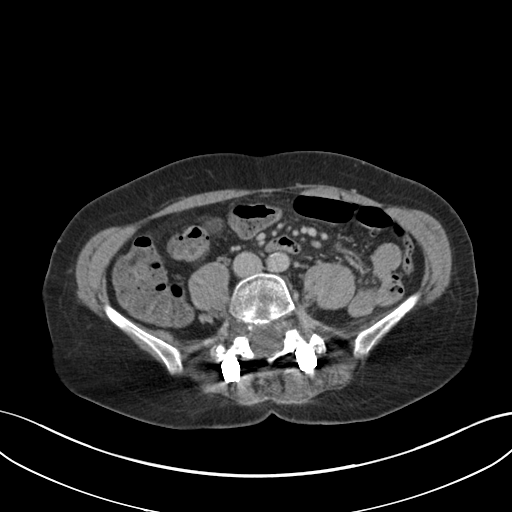
[im 49/83  soft-tissue]
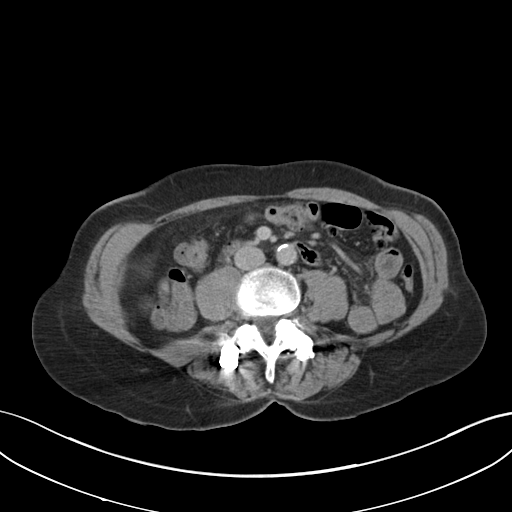
[im 49/83  bone]
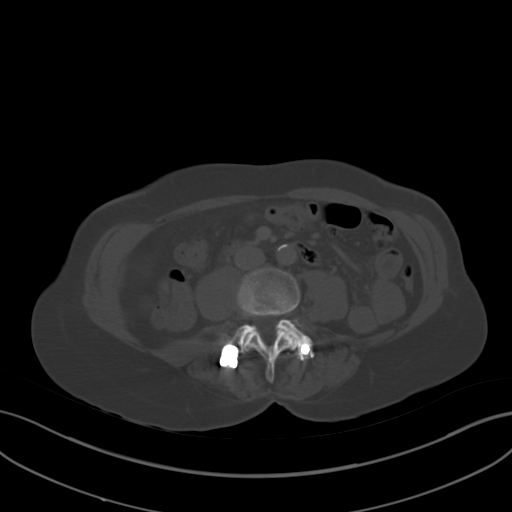
[im 54/83  soft-tissue]
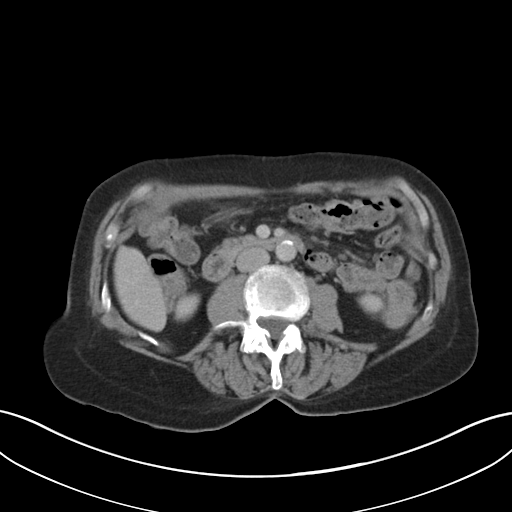
[im 63/83  soft-tissue]
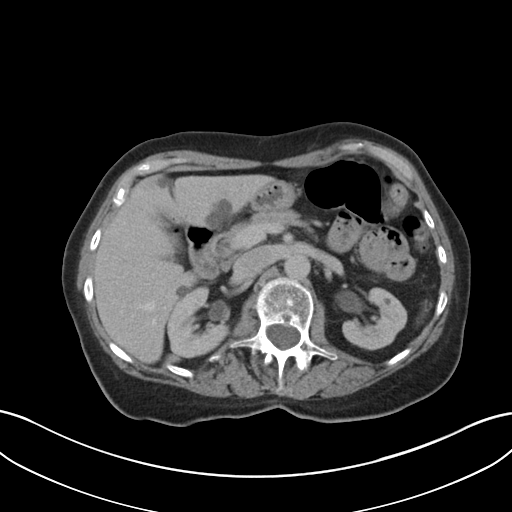
[im 68/83  soft-tissue]
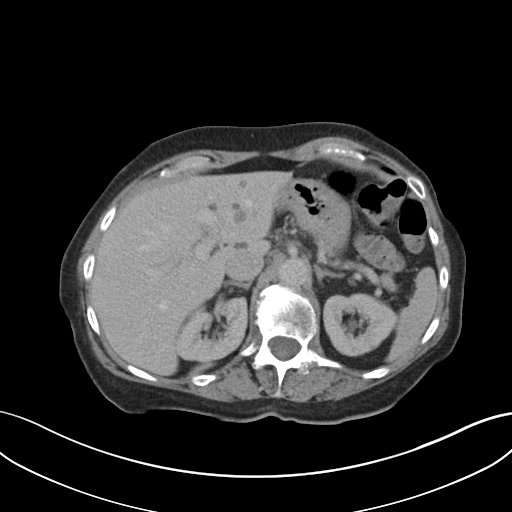
[im 73/83  soft-tissue]
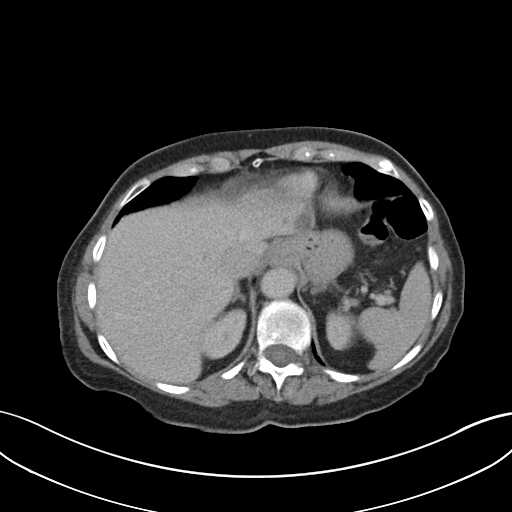
[im 78/83  soft-tissue]
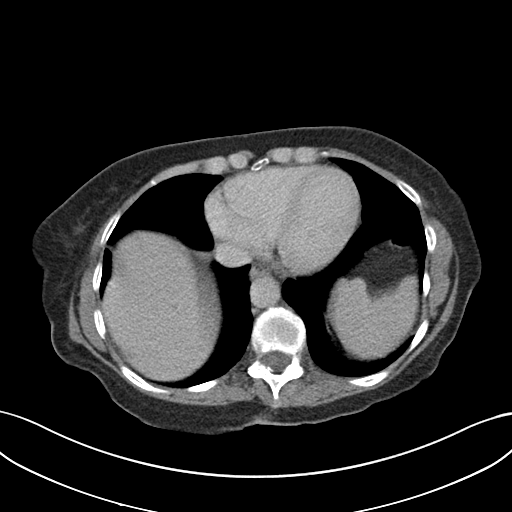

[Series 5: coronal a/|p · coronal · 0.74mm/px · 3 of 123 slices shown]
[im 41/123  soft-tissue]
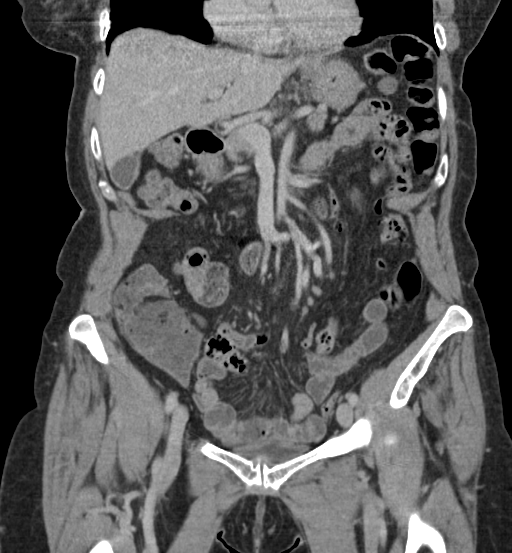
[im 55/123  soft-tissue]
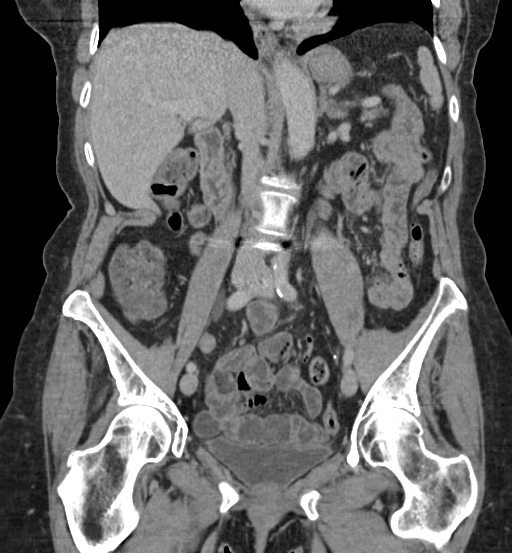
[im 68/123  soft-tissue]
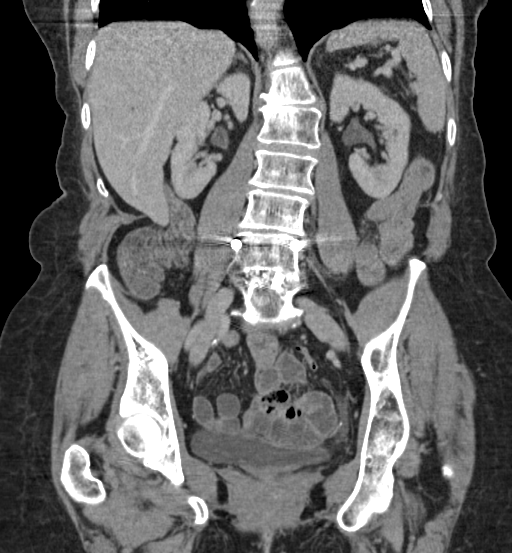

[17 of 46 positions shown; findings below may reference images not displayed]

FINDINGS: Lower chest: No acute abnormality.

Hepatobiliary: No gallstones are noted. Multiple hepatic cysts of
varying sizes are noted.

Pancreas: Unremarkable. No pancreatic ductal dilatation or
surrounding inflammatory changes.

Spleen: Normal in size without focal abnormality.

Adrenals/Urinary Tract: Adrenal glands are unremarkable. Kidneys are
normal, without renal calculi, focal lesion, or hydronephrosis.
Bladder is unremarkable.

Stomach/Bowel: There is no evidence of bowel obstruction.

Vascular/Lymphatic: Aortic atherosclerosis. No enlarged abdominal or
pelvic lymph nodes.

Reproductive: Status post hysterectomy. No adnexal masses.

Other: No abdominal wall hernia or abnormality. No abdominopelvic
ascites.

Musculoskeletal: No acute or significant osseous findings.
IMPRESSION: Aortic atherosclerosis.

Hepatic cysts.

No acute abnormality seen in the abdomen or pelvis.

## 2018-02-05 DIAGNOSIS — M7051 Other bursitis of knee, right knee: Secondary | ICD-10-CM | POA: Diagnosis not present

## 2018-02-05 DIAGNOSIS — M1711 Unilateral primary osteoarthritis, right knee: Secondary | ICD-10-CM | POA: Diagnosis not present

## 2018-02-14 DIAGNOSIS — H04123 Dry eye syndrome of bilateral lacrimal glands: Secondary | ICD-10-CM | POA: Diagnosis not present

## 2018-02-14 DIAGNOSIS — H01021 Squamous blepharitis right upper eyelid: Secondary | ICD-10-CM | POA: Diagnosis not present

## 2018-02-14 DIAGNOSIS — H01022 Squamous blepharitis right lower eyelid: Secondary | ICD-10-CM | POA: Diagnosis not present

## 2018-02-14 DIAGNOSIS — Z961 Presence of intraocular lens: Secondary | ICD-10-CM | POA: Diagnosis not present

## 2018-02-14 DIAGNOSIS — H26493 Other secondary cataract, bilateral: Secondary | ICD-10-CM | POA: Diagnosis not present

## 2018-02-14 DIAGNOSIS — H0014 Chalazion left upper eyelid: Secondary | ICD-10-CM | POA: Diagnosis not present

## 2018-02-14 DIAGNOSIS — H01025 Squamous blepharitis left lower eyelid: Secondary | ICD-10-CM | POA: Diagnosis not present

## 2018-02-14 DIAGNOSIS — H353131 Nonexudative age-related macular degeneration, bilateral, early dry stage: Secondary | ICD-10-CM | POA: Diagnosis not present

## 2018-02-14 DIAGNOSIS — H02052 Trichiasis without entropian right lower eyelid: Secondary | ICD-10-CM | POA: Diagnosis not present

## 2018-02-14 DIAGNOSIS — H43813 Vitreous degeneration, bilateral: Secondary | ICD-10-CM | POA: Diagnosis not present

## 2018-02-14 DIAGNOSIS — H01024 Squamous blepharitis left upper eyelid: Secondary | ICD-10-CM | POA: Diagnosis not present

## 2018-02-24 ENCOUNTER — Ambulatory Visit: Payer: PPO | Admitting: Physical Therapy

## 2018-04-01 ENCOUNTER — Encounter: Payer: Self-pay | Admitting: Oncology

## 2018-04-01 DIAGNOSIS — M8589 Other specified disorders of bone density and structure, multiple sites: Secondary | ICD-10-CM | POA: Diagnosis not present

## 2018-04-08 ENCOUNTER — Telehealth: Payer: Self-pay | Admitting: Interventional Cardiology

## 2018-04-08 NOTE — Telephone Encounter (Signed)
Pt c/o medication issue:  1. Name of Medication:  flecainide (TAMBOCOR) 100 MG tablet     2. How are you currently taking this medication (dosage and times per day)? TAKE ONE TABLET BY MOUTH TWICE DAILY  3. Are you having a reaction (difficulty breathing--STAT)? no  4. What is your medication issue? Makes pt very sleepy

## 2018-04-08 NOTE — Telephone Encounter (Signed)
Left message to call back  

## 2018-04-09 NOTE — Telephone Encounter (Signed)
Pt states she has c/o fatigue before after starting on Flecainide but it seems to have worsened now.  Pt took it this morning, went to gym and worked out for 40 mins and states she needs to take a nap now.  Pt states she has tried cutting back on the medication and skipping doses but she goes into Afib eventually when she does that.  Pt currently only taking 100mg  BID every other day.  Pt states she has an episode of Afib about every 4 weeks taking it like this.  Fatigue improves on the days she does not take the Flecainide.  Advised I will send message to Dr. Tamala Julian for review.

## 2018-04-09 NOTE — Telephone Encounter (Signed)
Left message to call back  

## 2018-04-09 NOTE — Telephone Encounter (Signed)
Decrease flecainide to 50 mg twice daily

## 2018-04-10 MED ORDER — FLECAINIDE ACETATE 50 MG PO TABS: 50.0000 mg | ORAL_TABLET | Freq: Two times a day (BID) | ORAL | 3 refills | Status: DC

## 2018-04-10 NOTE — Telephone Encounter (Signed)
Spoke with pt and went over recommendations per Dr. Smith.  Pt verbalized understanding and was in agreement with this plan.   

## 2018-04-14 DIAGNOSIS — D1724 Benign lipomatous neoplasm of skin and subcutaneous tissue of left leg: Secondary | ICD-10-CM | POA: Diagnosis not present

## 2018-04-14 DIAGNOSIS — D1723 Benign lipomatous neoplasm of skin and subcutaneous tissue of right leg: Secondary | ICD-10-CM | POA: Diagnosis not present

## 2018-04-14 DIAGNOSIS — L821 Other seborrheic keratosis: Secondary | ICD-10-CM | POA: Diagnosis not present

## 2018-04-14 DIAGNOSIS — L570 Actinic keratosis: Secondary | ICD-10-CM | POA: Diagnosis not present

## 2018-04-14 DIAGNOSIS — L565 Disseminated superficial actinic porokeratosis (DSAP): Secondary | ICD-10-CM | POA: Diagnosis not present

## 2018-05-19 ENCOUNTER — Telehealth: Payer: Self-pay | Admitting: Interventional Cardiology

## 2018-05-19 DIAGNOSIS — G459 Transient cerebral ischemic attack, unspecified: Secondary | ICD-10-CM

## 2018-05-19 NOTE — Telephone Encounter (Signed)
Pt calling.   She had Afib attack 6.10.19 that last 7 hrs and another one 6.12.19 that last 12 hrs. Please call pt to discuss

## 2018-05-19 NOTE — Telephone Encounter (Signed)
Pt states she had an episode of Afib on 6/10 that lasted about 7 hrs.  Had another episode on Friday that lasted several hours.  On 6/10 pt took 100mg  of Flecainide that morning and Afib eventually resolved.  Pt took 100mg  BID on Wednesday d/t feelings of being in Afib which resolved.  Pt has been back on Flecainide 50mg  BID since Thurs.  Denies any of there sx of Afib.  On Friday pt did mention that she felt fatigued and was trying to tell her son where to move a plant but couldn't verbalize what she wanted him to do.  States this lasted about 3 mins and then she was able to go on with her day.  Pt's HR when in Afib felt irregular but never got higher than 100.  Otherwise, HR has been 69-70.  Denies missing any doses of Eliquis.  Advised I would send message to Dr. Tamala Julian for review and advisement.

## 2018-05-20 ENCOUNTER — Other Ambulatory Visit: Payer: Self-pay

## 2018-05-20 ENCOUNTER — Ambulatory Visit (HOSPITAL_COMMUNITY): Payer: PPO | Attending: Cardiovascular Disease

## 2018-05-20 DIAGNOSIS — I10 Essential (primary) hypertension: Secondary | ICD-10-CM | POA: Diagnosis not present

## 2018-05-20 DIAGNOSIS — I480 Paroxysmal atrial fibrillation: Secondary | ICD-10-CM | POA: Insufficient documentation

## 2018-05-20 DIAGNOSIS — G459 Transient cerebral ischemic attack, unspecified: Secondary | ICD-10-CM | POA: Insufficient documentation

## 2018-05-20 NOTE — Telephone Encounter (Signed)
Left message to call back  

## 2018-05-20 NOTE — Telephone Encounter (Signed)
Spoke with Katherine Powell and went over recommendations per Dr. Smith.  Katherine Powell verbalized understanding and was in agreement with plan.  

## 2018-05-20 NOTE — Telephone Encounter (Signed)
Please ensure that she is taking anticoagulation appropriately.  She should strive for no missed doses. Lateral carotid Doppler study-diagnosis TIA. 2D Doppler echocardiogram-diagnosis TIA

## 2018-05-23 ENCOUNTER — Ambulatory Visit (HOSPITAL_COMMUNITY)
Admission: RE | Admit: 2018-05-23 | Discharge: 2018-05-23 | Disposition: A | Discharge status: Home / Self Care | Payer: PPO | Source: Ambulatory Visit | Attending: Cardiovascular Disease | Admitting: Cardiovascular Disease

## 2018-05-23 DIAGNOSIS — G459 Transient cerebral ischemic attack, unspecified: Secondary | ICD-10-CM | POA: Insufficient documentation

## 2018-06-04 DIAGNOSIS — H6123 Impacted cerumen, bilateral: Secondary | ICD-10-CM | POA: Diagnosis not present

## 2018-06-04 DIAGNOSIS — J3089 Other allergic rhinitis: Secondary | ICD-10-CM | POA: Diagnosis not present

## 2018-06-25 DIAGNOSIS — K2900 Acute gastritis without bleeding: Secondary | ICD-10-CM | POA: Diagnosis not present

## 2018-07-14 ENCOUNTER — Telehealth: Payer: Self-pay | Admitting: Oncology

## 2018-07-14 NOTE — Telephone Encounter (Signed)
Patient called to reschedule  °

## 2018-07-15 ENCOUNTER — Inpatient Hospital Stay: Payer: PPO

## 2018-07-18 ENCOUNTER — Inpatient Hospital Stay: Payer: PPO

## 2018-07-18 ENCOUNTER — Inpatient Hospital Stay: Payer: PPO | Attending: Oncology

## 2018-07-18 VITALS — BP 124/59 | HR 64 | Temp 98.0°F | Resp 18

## 2018-07-18 DIAGNOSIS — Z79811 Long term (current) use of aromatase inhibitors: Secondary | ICD-10-CM | POA: Diagnosis not present

## 2018-07-18 DIAGNOSIS — Z170 Estrogen receptor positive status [ER+]: Secondary | ICD-10-CM | POA: Insufficient documentation

## 2018-07-18 DIAGNOSIS — C50411 Malignant neoplasm of upper-outer quadrant of right female breast: Secondary | ICD-10-CM | POA: Diagnosis not present

## 2018-07-18 DIAGNOSIS — M8580 Other specified disorders of bone density and structure, unspecified site: Secondary | ICD-10-CM | POA: Insufficient documentation

## 2018-07-18 LAB — COMPREHENSIVE METABOLIC PANEL
ALT: 15 U/L (ref 0–44)
AST: 18 U/L (ref 15–41)
Albumin: 4 g/dL (ref 3.5–5.0)
Alkaline Phosphatase: 63 U/L (ref 38–126)
Anion gap: 5 (ref 5–15)
BUN: 22 mg/dL (ref 8–23)
CO2: 28 mmol/L (ref 22–32)
Calcium: 9.4 mg/dL (ref 8.9–10.3)
Chloride: 106 mmol/L (ref 98–111)
Creatinine, Ser: 1.08 mg/dL — ABNORMAL HIGH (ref 0.44–1.00)
GFR calc Af Amer: 53 mL/min — ABNORMAL LOW (ref >60)
GFR calc non Af Amer: 46 mL/min — ABNORMAL LOW (ref >60)
Glucose, Bld: 106 mg/dL — ABNORMAL HIGH (ref 70–99)
Potassium: 4.2 mmol/L (ref 3.5–5.1)
Sodium: 139 mmol/L (ref 135–145)
Total Bilirubin: 0.4 mg/dL (ref 0.3–1.2)
Total Protein: 6.5 g/dL (ref 6.5–8.1)

## 2018-07-18 LAB — CBC WITH DIFFERENTIAL/PLATELET
Basophils Absolute: 0 10*3/uL (ref 0.0–0.1)
Basophils Relative: 1 %
Eosinophils Absolute: 0.1 10*3/uL (ref 0.0–0.5)
Eosinophils Relative: 1 %
HCT: 35.4 % (ref 34.8–46.6)
Hemoglobin: 11.8 g/dL (ref 11.6–15.9)
Lymphocytes Relative: 19 %
Lymphs Abs: 1.3 10*3/uL (ref 0.9–3.3)
MCH: 32 pg (ref 25.1–34.0)
MCHC: 33.4 g/dL (ref 31.5–36.0)
MCV: 95.8 fL (ref 79.5–101.0)
Monocytes Absolute: 0.4 10*3/uL (ref 0.1–0.9)
Monocytes Relative: 6 %
Neutro Abs: 5.2 10*3/uL (ref 1.5–6.5)
Neutrophils Relative %: 73 %
Platelets: 186 10*3/uL (ref 145–400)
RBC: 3.69 MIL/uL — ABNORMAL LOW (ref 3.70–5.45)
RDW: 14.1 % (ref 11.2–14.5)
WBC: 7.2 10*3/uL (ref 3.9–10.3)

## 2018-07-18 MED ORDER — DENOSUMAB 60 MG/ML ~~LOC~~ SOSY
60.0000 mg | PREFILLED_SYRINGE | Freq: Once | SUBCUTANEOUS | Status: AC
  Administered 2018-07-18: 60 mg via SUBCUTANEOUS

## 2018-07-28 DIAGNOSIS — M7052 Other bursitis of knee, left knee: Secondary | ICD-10-CM | POA: Diagnosis not present

## 2018-07-28 DIAGNOSIS — M1711 Unilateral primary osteoarthritis, right knee: Secondary | ICD-10-CM | POA: Diagnosis not present

## 2018-07-28 DIAGNOSIS — M1712 Unilateral primary osteoarthritis, left knee: Secondary | ICD-10-CM | POA: Diagnosis not present

## 2018-08-20 ENCOUNTER — Telehealth: Payer: Self-pay | Admitting: Interventional Cardiology

## 2018-08-20 NOTE — Telephone Encounter (Signed)
Followed up.  She reports: BP 102/73, 92/62, 99/62,  Pulse  77,     78,      79  Pt is feeling better and HR has calmed down.  She feels it is still irregular but controlled. Pt is going to continue to monitor and call office back if she is going in and out of rhythm more frequently.  She states she had an episode last Friday morning and then this morning.  She typically doesn't experience AFib very frequently. Pt will call office back if she begins to have more breakthrough AFib and/or BP remains low. She wonders if she should take her Cartia in the morning as opposed to evening.  But will monitor BPs for the next week/so. Will forward to Dr. Tamala Julian and his nurse for their [facility].

## 2018-08-20 NOTE — Telephone Encounter (Signed)
Followed up w/ pt.  She just got out of the shower and not sure what her BP/HR is.  She thinks her HR has come down though. Informed that I would give her time to check HR/BP and call her back in a little while. Pt agreeable.

## 2018-08-20 NOTE — Telephone Encounter (Signed)
Pt calling in to report that "at 3 am I started w/ AFib".  Rates in the 130s.  She took 100 mg of Flecainide (usually only takes 50 mg) this morning at 8 am. He BP was low when she woke up at 100/78, but reports it is back to normal now w/ SBP around 130. She only reports weakness as an issue.  Denies dizziness, SOB. Pt is going to give the Flecainide some more time to "kick in".  She understands I will follow up in the next hour/so to see how she is doing. Pt agreeable and appreciative of the call/discussion.

## 2018-08-20 NOTE — Telephone Encounter (Signed)
New message   Pt c/o BP issue: STAT if pt c/o blurred vision, one-sided weakness or slurred speech  1. What are your last 5 BP readings? 98/57bp 131hr   08/20/2018   100/73 133 pr   2. Are you having any other symptoms (ex. Dizziness, headache, blurred vision, passed out)? weakness  3. What is your BP issue? bp is low and pr is high?

## 2018-09-30 DIAGNOSIS — H5789 Other specified disorders of eye and adnexa: Secondary | ICD-10-CM | POA: Diagnosis not present

## 2018-10-14 NOTE — Progress Notes (Signed)
Cardiology Office Note:    Date:  10/15/2018   ID:  Katherine Powell, DOB [birth year], MRN [id number]  PCP:  Cari Caraway, MD  Cardiologist:  Sinclair Grooms, MD   Referring MD: Cari Caraway, MD   Chief Complaint  Patient presents with  . Atrial Fibrillation    History of Present Illness:    Katherine Powell is a 84 y.o. female with a hx of paroxysmal atrial fibrillation, rhythm control with flecainide therapy, and chronic anticoagulation using Eliquis.  Well, she is doing relatively well.  She has had no bleeding on Eliquis.  No head trauma.  Exertional tolerance is been stable.  She exercises 3 to 4 days/week.  She does yoga.  She has had atrial fibrillation since June and on one occasion, in July.  She has not taken flecainide as ordered.  She takes 25 mg in the morning and 50 mg at night.  If she feels atrial fibrillation has developed, she will take an additional 50 mg flecainide tablet and relax.  This is always led to reversion to normal sinus rhythm.  Work-up in May/June timeframe after an episode of word finding difficulty did not demonstrate an evidence of carotid disease.  Echocardiogram at that time was unremarkable.  No further work-up was done.  She is back to normal function.  The word finding difficulty lasted approximately 3 to 5 minutes.  Residual complaints.    Past Medical History:  Diagnosis Date  . Anxiety   . Arrhythmia    atrial fibrillation/  on   . Arthritis   . Atrial fibrillation ([facility])   . Breast cancer ([place]) 11/09/13   right upper outer  . Contact lens/glasses fitting    contact rt eye  . Hx of radiation therapy 12/15/13- 01/11/14   right breast 4250 cGy in 17 sessions, (hypo-fractionated), no boost  . Hypertension   . Postmenopausal   . Rosacea   . Seasonal allergies   . Sleep disturbance   . Thyroid disease   . Urinary incontinence     Past Surgical History:  Procedure Laterality Date  . ABDOMINAL HYSTERECTOMY  1973   endometriosis    . APPENDECTOMY  1954  . BACK SURGERY     lumb-2005  . BILATERAL OOPHORECTOMY     benign tumor on ovary  . BREAST LUMPECTOMY WITH NEEDLE LOCALIZATION Right 11/09/2013   Procedure: BREAST LUMPECTOMY WITH NEEDLE LOCALIZATION;  Surgeon: Harl Bowie, MD;  Location: [facility];  Service: General;  Laterality: Right;  . CATARACT EXTRACTION  2013   both  . CYSTOCELE REPAIR  2008  . EYE SURGERY     cataracts-plugs for eyes  . RECTOCELE REPAIR  2008  . TONSILLECTOMY      Current Medications: Current Meds  Medication Sig  . acetaminophen (TYLENOL) 650 MG CR tablet Take 650 mg by mouth every 8 (eight) hours as needed for pain. Reported on 01/19/2016  . ALPRAZolam (XANAX) 0.5 MG tablet Take 0.5 mg by mouth at bedtime as needed for anxiety.  [name] anastrozole (ARIMIDEX) 1 MG tablet Take 1 tablet (1 mg total) by mouth daily.  [name] apixaban (ELIQUIS) 5 MG TABS tablet Take 1 tablet (5 mg total) by mouth 2 (two) times daily.  . Calcium-Phosphorus-Vitamin D (CITRACAL +D3 PO) Take 1 tablet by mouth daily.  [name] CARTIA XT 120 MG 24 hr capsule Take 120 mg by mouth every evening.   . cholecalciferol (VITAMIN D) 400 UNITS TABS tablet Take 400 Units  by mouth 2 (two) times daily.   . cloNIDine (CATAPRES) 0.1 MG tablet TAKE 1 TABLET (0.1 MG TOTAL) BY MOUTH 2 (TWO) TIMES DAILY AS NEEDED.  [name] cycloSPORINE (RESTASIS) 0.05 % ophthalmic emulsion Place 1 drop into both eyes 2 (two) times daily.   . fexofenadine (ALLEGRA) 180 MG tablet Take 180 mg by mouth continuous as needed for allergies or rhinitis.  . flecainide (TAMBOCOR) 50 MG tablet Take 1 tablet (50 mg total) by mouth 2 (two) times daily.  . fluticasone (FLONASE) 50 MCG/ACT nasal spray Place 1 spray into both nostrils daily as needed for allergies. Reported on 01/19/2016  . glucosamine-chondroitin 500-400 MG tablet Take 1 tablet by mouth 2 (two) times daily.  . Omega-3 Fatty Acids (EQL OMEGA 3 FISH OIL) 1400 MG CAPS Take 1 capsule by mouth daily.   . Risedronate Sodium (ACTONEL PO) Take 1 tablet by mouth daily.   . sodium chloride (OCEAN) 0.65 % nasal spray Place 1 spray into the nose as needed for congestion. Reported on 01/19/2016  . trolamine salicylate (ASPERCREME) 10 % cream Apply 1 application topically as needed for muscle pain.     Allergies:   Codeine and Gabapentin   Social History   Socioeconomic History  . Marital status: Married    Spouse name: Not on file  . Number of children: Not on file  . Years of education: Not on file  . Highest education level: Not on file  Occupational History  . Not on file  Social Needs  . Financial resource strain: Not on file  . Food insecurity:    Worry: Not on file    Inability: Not on file  . Transportation needs:    Medical: Not on file    Non-medical: Not on file  Tobacco Use  . Smoking status: Never Smoker  . Smokeless tobacco: Never Used  Substance and Sexual Activity  . Alcohol use: No  . Drug use: No  . Sexual activity: Never    Comment: menarche age 41, P46, first live birth age 5,  HRT x 30 years  Lifestyle  . Physical activity:    Days per week: Not on file    Minutes per session: Not on file  . Stress: Not on file  Relationships  . Social connections:    Talks on phone: Not on file    Gets together: Not on file    Attends religious service: Not on file    Active member of club or organization: Not on file    Attends meetings of clubs or organizations: Not on file    Relationship status: Not on file  Other Topics Concern  . Not on file  Social History Narrative  . Not on file     Family History: The patient's family history includes Alzheimer's disease in her mother; Stroke in her father. There is no history of Heart attack.  ROS:   Please see the history of present illness.    Occasional irregular heartbeats.  Otherwise no complaints.  All other systems reviewed and are negative.  EKGs/Labs/Other Studies Reviewed:    The following studies were  reviewed today: Doppler echocardiogram done 05/20/2018: ------------------------------------------------------------------- Study Conclusions  - Left ventricle: The cavity size was normal. There was mild focal   basal hypertrophy of the septum. Systolic function was normal.   The estimated ejection fraction was in the range of 60% to 65%.   Wall motion was normal; there were no regional wall motion   abnormalities. Features  are consistent with a pseudonormal left   ventricular filling pattern, with concomitant abnormal relaxation   and increased filling pressure (grade 2 diastolic dysfunction). - Aortic valve: There was mild regurgitation. - Mitral valve: Systolic bowing without prolapse. - Left atrium: The atrium was mildly dilated.   Bilateral carotid Doppler study 05/23/2018: Final Interpretation: Right Carotid: The extracranial vessels were near-normal with only minimal wall        thickening or plaque.  Left Carotid: The extracranial vessels were near-normal with only minimal wall       thickening or plaque.  Vertebrals: Bilateral vertebral arteries demonstrate antegrade flow. Subclavians: Normal flow hemodynamics were seen in bilateral subclavian       arteries.  EKG:  EKG is  ordered today.  The ekg ordered today demonstrates sinus rhythm at 61 bpm.  Normal PR interval.  Normal tracing.  Compared to the last study 09/24/2017, the heart rate is slightly faster.  Recent Labs: 07/18/2018: ALT 15; BUN 22; Creatinine, Ser 1.08; Hemoglobin 11.8; Platelets 186; Potassium 4.2; Sodium 139  Recent Lipid Panel No results found for: CHOL, TRIG, HDL, CHOLHDL, VLDL, LDLCALC, LDLDIRECT  Physical Exam:    VS:  Ht 5\' 7"  (1.702 m)   BMI 24.92 kg/m     Wt Readings from Last 3 Encounters:  01/15/18 159 lb 1.6 oz (72.2 kg)  09/24/17 154 lb 12.8 oz (70.2 kg)  03/29/17 152 lb (68.9 kg)     GEN: Appears younger than her stated age.  Well nourished, well developed  in no acute distress HEENT: Normal NECK: No JVD. LYMPHATICS: No lymphadenopathy CARDIAC: RRR, no murmur, no gallop, no  edema. VASCULAR: 2+ bilateral symmetric carotid and radial pulses.  No bruits. RESPIRATORY:  Clear to auscultation without rales, wheezing or rhonchi  ABDOMEN: Soft, non-tender, non-distended, No pulsatile mass, MUSCULOSKELETAL: No deformity  SKIN: Warm and dry NEUROLOGIC:  Alert and oriented x 3 PSYCHIATRIC:  Normal affect   ASSESSMENT:    1. Paroxysmal atrial fibrillation (HCC)   2. Chronic anticoagulation   3. Encounter for monitoring flecainide therapy    PLAN:    In order of problems listed above:  1. Excellent rhythm control on flecainide.   Un-advised sub-therapeutic dosing of flecainide.  She is doing so well after discussing decided to leave current therapy as is.  She is essentially using flecainide as a pill in the pocket and low-dose therapy seems to have significantly decreased episodes of atrial fibrillation for which he needs to take higher dose flecainide therapy.  She would notify us if any change in burden of atrial fibrillation. 2. Easy bruising but no concerning bleeding.  Monitor closely and stay active. 3. No evidence of toxicity.  EKG today is unremarkable.  The natural history of atrial fibrillation was discussed. The inability to cure and the possibility of recurrences was clearly stated.  Management strategies including rhythm control (antiarrhythmic therapy), rate control (beta-blocker therapy or AV node blocking calcium channel blocker therapy), and ablation and/or pacemaker therapy were reviewed.  Stroke risk (as determined by CHADS VASC score >1) was discussed relative to the patient's individual profile.  The expected duration/permanence of anti-coagulation therapy was determined based on the individual risk score.  Coumadin versus NOAC therapy was reviewed, highlighting the lower bleeding risk and improved safety with NOAC therapy.     Year follow-up unless increased activity of A. fib.  Medication Adjustments/Labs and Tests Ordered: Current medicines are reviewed at length with the patient today.  Concerns regarding medicines are outlined  above.  No orders of the defined types were placed in this encounter.  No orders of the defined types were placed in this encounter.   There are no Patient Instructions on file for this visit.   Signed, Sinclair Grooms, MD  10/15/2018 9:46 AM    [place]

## 2018-10-15 ENCOUNTER — Ambulatory Visit: Payer: PPO | Admitting: Interventional Cardiology

## 2018-10-15 ENCOUNTER — Encounter: Payer: Self-pay | Admitting: Interventional Cardiology

## 2018-10-15 VITALS — BP 124/78 | HR 61 | Ht 67.0 in | Wt 155.1 lb

## 2018-10-15 DIAGNOSIS — Z79899 Other long term (current) drug therapy: Secondary | ICD-10-CM | POA: Diagnosis not present

## 2018-10-15 DIAGNOSIS — I480 Paroxysmal atrial fibrillation: Secondary | ICD-10-CM

## 2018-10-15 DIAGNOSIS — Z7901 Long term (current) use of anticoagulants: Secondary | ICD-10-CM

## 2018-10-15 DIAGNOSIS — Z5181 Encounter for therapeutic drug level monitoring: Secondary | ICD-10-CM

## 2018-10-15 NOTE — Patient Instructions (Signed)
Medication Instructions:  No change If you need a refill on your cardiac medications before your next appointment, please call your pharmacy.   Lab work: none If you have labs (blood work) drawn today and your tests are completely normal, you will receive your results only by: . MyChart Message (if you have MyChart) OR . A paper copy in the mail If you have any lab test that is abnormal or we need to change your treatment, we will call you to review the results.  Testing/Procedures: none  Follow-Up: At [facility], you and your health needs are our priority.  As part of our continuing mission to provide you with exceptional heart care, we have created designated Provider Care Teams.  These Care Teams include your primary Cardiologist (physician) and Advanced Practice Providers (APPs -  Physician Assistants and Nurse Practitioners) who all work together to provide you with the care you need, when you need it. You will need a follow up appointment in 12 months.  Please call our office 2 months in advance to schedule this appointment.  You may see Henry W Smith III, MD or one of the following Advanced Practice Providers on your designated Care Team:   Lori Gerhardt, NP Laura Ingold, NP . Jill McDaniel, NP  Any Other Special Instructions Will Be Listed Below (If Applicable).    

## 2018-11-14 ENCOUNTER — Other Ambulatory Visit: Payer: Self-pay | Admitting: *Deleted

## 2018-11-14 MED ORDER — FLECAINIDE ACETATE 50 MG PO TABS: 25.0000 mg | ORAL_TABLET | Freq: Two times a day (BID) | ORAL | 3 refills | Status: DC

## 2018-11-20 ENCOUNTER — Other Ambulatory Visit: Payer: Self-pay | Admitting: Interventional Cardiology

## 2018-11-20 NOTE — Telephone Encounter (Signed)
Outpatient Medication Detail    Disp Refills Start End   flecainide (TAMBOCOR) 50 MG tablet 180 tablet 3 11/14/2018    Sig - Route: Take 0.5-1 tablets (25-50 mg total) by mouth 2 (two) times daily. - Oral   Sent to pharmacy as: flecainide (TAMBOCOR) 50 MG tablet   E-Prescribing Status: Receipt confirmed by pharmacy (11/14/2018 4:35 PM EST)   Pharmacy   [facility] # [address], [place]

## 2018-11-24 ENCOUNTER — Telehealth: Payer: Self-pay | Admitting: Interventional Cardiology

## 2018-11-24 NOTE — Telephone Encounter (Signed)
Spoke with pt and advised her that at last appt Dr. Tamala Julian noted that she had been taking Flecainide 25 QAM and 50 QPM.  Pt states this is incorrect and that she use to take 5 QAM and 100 QPM.  States she currently takes 50mg  BID and has had no issues with Afib.  Pt states she will plan to continue the 50mg  BID since it seems to be holding off the Afib.  Advised to call if she has episodes so we can adjust medication if warranted.  Pt verbalized understanding and was in agreement with this plan.

## 2018-11-24 NOTE — Telephone Encounter (Signed)
New message    Pt c/o medication issue:  1. Name of Medication:  flecainide (TAMBOCOR) 50 MG tablet  2. How are you currently taking this medication (dosage and times per day)? 50 2x a day   3. Are you having a reaction (difficulty breathing--STAT)?  No  4. What is your medication issue? Pt stated that she normally gets 100mg  and wants to know why she got 50 mg tabs. Please call

## 2018-11-26 DIAGNOSIS — I082 Rheumatic disorders of both aortic and tricuspid valves: Secondary | ICD-10-CM | POA: Diagnosis not present

## 2018-11-26 DIAGNOSIS — T22292A Burn of second degree of multiple sites of left shoulder and upper limb, except wrist and hand, initial encounter: Secondary | ICD-10-CM | POA: Diagnosis not present

## 2018-11-26 DIAGNOSIS — I10 Essential (primary) hypertension: Secondary | ICD-10-CM | POA: Diagnosis not present

## 2018-11-26 DIAGNOSIS — Z7901 Long term (current) use of anticoagulants: Secondary | ICD-10-CM | POA: Diagnosis not present

## 2018-11-26 DIAGNOSIS — I517 Cardiomegaly: Secondary | ICD-10-CM | POA: Diagnosis not present

## 2018-11-26 DIAGNOSIS — Z9049 Acquired absence of other specified parts of digestive tract: Secondary | ICD-10-CM | POA: Diagnosis not present

## 2018-11-26 DIAGNOSIS — I351 Nonrheumatic aortic (valve) insufficiency: Secondary | ICD-10-CM | POA: Diagnosis not present

## 2018-11-26 DIAGNOSIS — T300 Burn of unspecified body region, unspecified degree: Secondary | ICD-10-CM | POA: Diagnosis not present

## 2018-11-26 DIAGNOSIS — T22212A Burn of second degree of left forearm, initial encounter: Secondary | ICD-10-CM | POA: Diagnosis not present

## 2018-11-26 DIAGNOSIS — T310 Burns involving less than 10% of body surface: Secondary | ICD-10-CM | POA: Diagnosis not present

## 2018-11-26 DIAGNOSIS — T22232A Burn of second degree of left upper arm, initial encounter: Secondary | ICD-10-CM | POA: Diagnosis not present

## 2018-11-26 DIAGNOSIS — Z90710 Acquired absence of both cervix and uterus: Secondary | ICD-10-CM | POA: Diagnosis not present

## 2018-11-26 DIAGNOSIS — I480 Paroxysmal atrial fibrillation: Secondary | ICD-10-CM | POA: Diagnosis not present

## 2018-11-26 DIAGNOSIS — T2220XA Burn of second degree of shoulder and upper limb, except wrist and hand, unspecified site, initial encounter: Secondary | ICD-10-CM | POA: Diagnosis not present

## 2018-11-26 DIAGNOSIS — R001 Bradycardia, unspecified: Secondary | ICD-10-CM | POA: Diagnosis not present

## 2018-11-26 DIAGNOSIS — I700 Atherosclerosis of aorta: Secondary | ICD-10-CM | POA: Diagnosis not present

## 2018-11-26 DIAGNOSIS — Z01810 Encounter for preprocedural cardiovascular examination: Secondary | ICD-10-CM | POA: Diagnosis not present

## 2018-11-26 DIAGNOSIS — I4891 Unspecified atrial fibrillation: Secondary | ICD-10-CM | POA: Diagnosis not present

## 2018-11-26 DIAGNOSIS — I361 Nonrheumatic tricuspid (valve) insufficiency: Secondary | ICD-10-CM | POA: Diagnosis not present

## 2018-11-27 DIAGNOSIS — I351 Nonrheumatic aortic (valve) insufficiency: Secondary | ICD-10-CM | POA: Diagnosis not present

## 2018-11-27 DIAGNOSIS — I361 Nonrheumatic tricuspid (valve) insufficiency: Secondary | ICD-10-CM | POA: Diagnosis not present

## 2018-11-27 DIAGNOSIS — I517 Cardiomegaly: Secondary | ICD-10-CM | POA: Diagnosis not present

## 2018-12-01 ENCOUNTER — Telehealth: Payer: Self-pay | Admitting: Interventional Cardiology
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
## 2018-12-01 NOTE — Telephone Encounter (Signed)
° ° °  Patient calling with concerns about afib, started on 12/29.  Patient would like to discuss taking additional flecainide (TAMBOCOR) 50 MG tablet

## 2018-12-02 NOTE — Telephone Encounter (Signed)
She is anticoagulated.  Therefore no worries about stroke. If atrial fibrillation tenuous or if becomes an tolerable, she will have no choice but to make an ER visit or call us back for further recommendations. For the time being she needs to lie down and take it easy.  Okay to take her next dose of 50 mg flecainide this evening since she is already had 100 mg this morning.

## 2018-12-02 NOTE — Telephone Encounter (Signed)
Spoke with pt and made her aware of recommendations per Dr. Tamala Julian. Pt states she converted last night around 7pm.  Advised if she continues to go in and out and they last a long time, to let us know.  Pt verbalized understanding and was in agreement with this plan.

## 2018-12-05 ENCOUNTER — Other Ambulatory Visit: Payer: Self-pay

## 2018-12-05 DIAGNOSIS — R768 Other specified abnormal immunological findings in serum: Secondary | ICD-10-CM | POA: Insufficient documentation

## 2018-12-05 NOTE — Patient Outreach (Signed)
[facility]) Care Management  12/05/2018  Katherine Powell March 20, 1932 [id number]   Referral received. No outreach warranted at this time. Transition of Care  will be completed by primary care provider office who will refer to [facility] care management if needed.  Plan: RN CM will close case.  Jone Baseman, RN, MSN [place] Management Care Management Coordinator Direct Line [phone] Cell [phone] Toll Free: [phone]  Fax: [phone]

## 2018-12-10 DIAGNOSIS — T22292A Burn of second degree of multiple sites of left shoulder and upper limb, except wrist and hand, initial encounter: Secondary | ICD-10-CM | POA: Diagnosis not present

## 2018-12-10 DIAGNOSIS — T310 Burns involving less than 10% of body surface: Secondary | ICD-10-CM | POA: Diagnosis not present

## 2018-12-10 DIAGNOSIS — T22292D Burn of second degree of multiple sites of left shoulder and upper limb, except wrist and hand, subsequent encounter: Secondary | ICD-10-CM | POA: Diagnosis not present

## 2018-12-18 DIAGNOSIS — T22292A Burn of second degree of multiple sites of left shoulder and upper limb, except wrist and hand, initial encounter: Secondary | ICD-10-CM | POA: Diagnosis not present

## 2018-12-23 DIAGNOSIS — Z5181 Encounter for therapeutic drug level monitoring: Secondary | ICD-10-CM | POA: Diagnosis not present

## 2018-12-23 DIAGNOSIS — H9190 Unspecified hearing loss, unspecified ear: Secondary | ICD-10-CM | POA: Diagnosis not present

## 2018-12-23 DIAGNOSIS — I10 Essential (primary) hypertension: Secondary | ICD-10-CM | POA: Diagnosis not present

## 2018-12-23 DIAGNOSIS — N3946 Mixed incontinence: Secondary | ICD-10-CM | POA: Diagnosis not present

## 2018-12-23 DIAGNOSIS — C50911 Malignant neoplasm of unspecified site of right female breast: Secondary | ICD-10-CM | POA: Diagnosis not present

## 2018-12-23 DIAGNOSIS — L719 Rosacea, unspecified: Secondary | ICD-10-CM | POA: Diagnosis not present

## 2018-12-23 DIAGNOSIS — N183 Chronic kidney disease, stage 3 (moderate): Secondary | ICD-10-CM | POA: Diagnosis not present

## 2018-12-23 DIAGNOSIS — I480 Paroxysmal atrial fibrillation: Secondary | ICD-10-CM | POA: Diagnosis not present

## 2018-12-23 DIAGNOSIS — M85851 Other specified disorders of bone density and structure, right thigh: Secondary | ICD-10-CM | POA: Diagnosis not present

## 2018-12-23 DIAGNOSIS — J3089 Other allergic rhinitis: Secondary | ICD-10-CM | POA: Diagnosis not present

## 2018-12-23 DIAGNOSIS — R413 Other amnesia: Secondary | ICD-10-CM | POA: Diagnosis not present

## 2018-12-25 DIAGNOSIS — T310 Burns involving less than 10% of body surface: Secondary | ICD-10-CM | POA: Diagnosis not present

## 2018-12-25 DIAGNOSIS — R600 Localized edema: Secondary | ICD-10-CM | POA: Diagnosis not present

## 2018-12-25 DIAGNOSIS — T22292D Burn of second degree of multiple sites of left shoulder and upper limb, except wrist and hand, subsequent encounter: Secondary | ICD-10-CM | POA: Diagnosis not present

## 2019-01-07 ENCOUNTER — Encounter: Payer: Self-pay | Admitting: Oncology

## 2019-01-07 DIAGNOSIS — Z853 Personal history of malignant neoplasm of breast: Secondary | ICD-10-CM | POA: Diagnosis not present

## 2019-01-09 DIAGNOSIS — M7052 Other bursitis of knee, left knee: Secondary | ICD-10-CM | POA: Diagnosis not present

## 2019-01-09 DIAGNOSIS — M1711 Unilateral primary osteoarthritis, right knee: Secondary | ICD-10-CM | POA: Diagnosis not present

## 2019-01-09 DIAGNOSIS — M1712 Unilateral primary osteoarthritis, left knee: Secondary | ICD-10-CM | POA: Diagnosis not present

## 2019-01-22 DIAGNOSIS — T22292D Burn of second degree of multiple sites of left shoulder and upper limb, except wrist and hand, subsequent encounter: Secondary | ICD-10-CM | POA: Diagnosis not present
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
## 2019-01-26 ENCOUNTER — Inpatient Hospital Stay (HOSPITAL_BASED_OUTPATIENT_CLINIC_OR_DEPARTMENT_OTHER): Payer: PPO | Admitting: Oncology

## 2019-01-26 ENCOUNTER — Inpatient Hospital Stay: Payer: PPO

## 2019-01-26 ENCOUNTER — Inpatient Hospital Stay: Payer: PPO | Attending: Oncology

## 2019-01-26 VITALS — BP 135/72 | HR 69 | Temp 97.8°F | Resp 18 | Ht 67.0 in | Wt 154.5 lb

## 2019-01-26 DIAGNOSIS — M8580 Other specified disorders of bone density and structure, unspecified site: Secondary | ICD-10-CM | POA: Diagnosis not present

## 2019-01-26 DIAGNOSIS — Z170 Estrogen receptor positive status [ER+]: Principal | ICD-10-CM

## 2019-01-26 DIAGNOSIS — Z7901 Long term (current) use of anticoagulants: Secondary | ICD-10-CM

## 2019-01-26 DIAGNOSIS — C50411 Malignant neoplasm of upper-outer quadrant of right female breast: Secondary | ICD-10-CM | POA: Insufficient documentation

## 2019-01-26 DIAGNOSIS — Z79811 Long term (current) use of aromatase inhibitors: Secondary | ICD-10-CM

## 2019-01-26 DIAGNOSIS — Z90710 Acquired absence of both cervix and uterus: Secondary | ICD-10-CM

## 2019-01-26 DIAGNOSIS — Z923 Personal history of irradiation: Secondary | ICD-10-CM | POA: Diagnosis not present

## 2019-01-26 DIAGNOSIS — I4891 Unspecified atrial fibrillation: Secondary | ICD-10-CM

## 2019-01-26 DIAGNOSIS — Z90722 Acquired absence of ovaries, bilateral: Secondary | ICD-10-CM

## 2019-01-26 DIAGNOSIS — Z9079 Acquired absence of other genital organ(s): Secondary | ICD-10-CM

## 2019-01-26 LAB — CBC WITH DIFFERENTIAL/PLATELET
Abs Immature Granulocytes: 0.01 10*3/uL (ref 0.00–0.07)
Basophils Absolute: 0 10*3/uL (ref 0.0–0.1)
Basophils Relative: 1 %
Eosinophils Absolute: 0.1 10*3/uL (ref 0.0–0.5)
Eosinophils Relative: 2 %
HCT: 35.6 % — ABNORMAL LOW (ref 36.0–46.0)
Hemoglobin: 11.6 g/dL — ABNORMAL LOW (ref 12.0–15.0)
Immature Granulocytes: 0 %
Lymphocytes Relative: 22 %
Lymphs Abs: 1.2 10*3/uL (ref 0.7–4.0)
MCH: 31.6 pg (ref 26.0–34.0)
MCHC: 32.6 g/dL (ref 30.0–36.0)
MCV: 97 fL (ref 80.0–100.0)
Monocytes Absolute: 0.4 10*3/uL (ref 0.1–1.0)
Monocytes Relative: 7 %
Neutro Abs: 3.8 10*3/uL (ref 1.7–7.7)
Neutrophils Relative %: 68 %
Platelets: 184 10*3/uL (ref 150–400)
RBC: 3.67 MIL/uL — ABNORMAL LOW (ref 3.87–5.11)
RDW: 14 % (ref 11.5–15.5)
WBC: 5.6 10*3/uL (ref 4.0–10.5)
nRBC: 0 % (ref 0.0–0.2)

## 2019-01-26 LAB — COMPREHENSIVE METABOLIC PANEL
ALT: 13 U/L (ref 0–44)
AST: 17 U/L (ref 15–41)
Albumin: 4 g/dL (ref 3.5–5.0)
Alkaline Phosphatase: 55 U/L (ref 38–126)
Anion gap: 8 (ref 5–15)
BUN: 20 mg/dL (ref 8–23)
CO2: 26 mmol/L (ref 22–32)
Calcium: 9.1 mg/dL (ref 8.9–10.3)
Chloride: 102 mmol/L (ref 98–111)
Creatinine, Ser: 1.04 mg/dL — ABNORMAL HIGH (ref 0.44–1.00)
GFR calc Af Amer: 57 mL/min — ABNORMAL LOW (ref >60)
GFR calc non Af Amer: 49 mL/min — ABNORMAL LOW (ref >60)
Glucose, Bld: 111 mg/dL — ABNORMAL HIGH (ref 70–99)
Potassium: 3.9 mmol/L (ref 3.5–5.1)
Sodium: 136 mmol/L (ref 135–145)
Total Bilirubin: 0.4 mg/dL (ref 0.3–1.2)
Total Protein: 6.6 g/dL (ref 6.5–8.1)

## 2019-01-26 MED ORDER — DENOSUMAB 60 MG/ML ~~LOC~~ SOSY
60.0000 mg | PREFILLED_SYRINGE | Freq: Once | SUBCUTANEOUS | Status: AC
  Administered 2019-01-26: 60 mg via SUBCUTANEOUS

## 2019-01-26 MED ORDER — DENOSUMAB 60 MG/ML ~~LOC~~ SOSY
PREFILLED_SYRINGE | SUBCUTANEOUS | Status: AC
  Filled 2019-01-26: qty 1

## 2019-01-26 NOTE — Patient Instructions (Signed)
Denosumab injection  What is this medicine?  DENOSUMAB (den oh sue mab) slows bone breakdown. Prolia is used to treat osteoporosis in women after menopause and in men. Xgeva is used to prevent bone fractures and other bone problems caused by cancer bone metastases. Xgeva is also used to treat giant cell tumor of the bone.  This medicine may be used for other purposes; ask your health care provider or pharmacist if you have questions.  What should I tell my health care provider before I take this medicine?  They need to know if you have any of these conditions:  -dental disease  -eczema  -infection or history of infections  -kidney disease or on dialysis  -low blood calcium or vitamin D  -malabsorption syndrome  -scheduled to have surgery or tooth extraction  -taking medicine that contains denosumab  -thyroid or parathyroid disease  -an unusual reaction to denosumab, other medicines, foods, dyes, or preservatives  -pregnant or trying to get pregnant  -breast-feeding  How should I use this medicine?  This medicine is for injection under the skin. It is given by a health care professional in a hospital or clinic setting.  If you are getting Prolia, a special MedGuide will be given to you by the pharmacist with each prescription and refill. Be sure to read this information carefully each time.  For Prolia, talk to your pediatrician regarding the use of this medicine in children. Special care may be needed. For Xgeva, talk to your pediatrician regarding the use of this medicine in children. While this drug may be prescribed for children as young as 13 years for selected conditions, precautions do apply.  Overdosage: If you think you have taken too much of this medicine contact a poison control center or emergency room at once.  NOTE: This medicine is only for you. Do not share this medicine with others.  What if I miss a dose?  It is important not to miss your dose. Call your doctor or health care professional if you are  unable to keep an appointment.  What may interact with this medicine?  Do not take this medicine with any of the following medications:  -other medicines containing denosumab  This medicine may also interact with the following medications:  -medicines that suppress the immune system  -medicines that treat cancer  -steroid medicines like prednisone or cortisone  This list may not describe all possible interactions. Give your health care provider a list of all the medicines, herbs, non-prescription drugs, or dietary supplements you use. Also tell them if you smoke, drink alcohol, or use illegal drugs. Some items may interact with your medicine.  What should I watch for while using this medicine?  Visit your doctor or health care professional for regular checks on your progress. Your doctor or health care professional may order blood tests and other tests to see how you are doing.  Call your doctor or health care professional if you get a cold or other infection while receiving this medicine. Do not treat yourself. This medicine may decrease your body's ability to fight infection.  You should make sure you get enough calcium and vitamin D while you are taking this medicine, unless your doctor tells you not to. Discuss the foods you eat and the vitamins you take with your health care professional.  See your dentist regularly. Brush and floss your teeth as directed. Before you have any dental work done, tell your dentist you are receiving this medicine.  Do   not become pregnant while taking this medicine or for 5 months after stopping it. Women should inform their doctor if they wish to become pregnant or think they might be pregnant. There is a potential for serious side effects to an unborn child. Talk to your health care professional or pharmacist for more information.  What side effects may I notice from receiving this medicine?  Side effects that you should report to your doctor or health care professional as soon as  possible:  -allergic reactions like skin rash, itching or hives, swelling of the face, lips, or tongue  -breathing problems  -chest pain  -fast, irregular heartbeat  -feeling faint or lightheaded, falls  -fever, chills, or any other sign of infection  -muscle spasms, tightening, or twitches  -numbness or tingling  -skin blisters or bumps, or is dry, peels, or red  -slow healing or unexplained pain in the mouth or jaw  -unusual bleeding or bruising  Side effects that usually do not require medical attention (Report these to your doctor or health care professional if they continue or are bothersome.):  -muscle pain  -stomach upset, gas  This list may not describe all possible side effects. Call your doctor for medical advice about side effects. You may report side effects to FDA at 1-800-FDA-1088.  Where should I keep my medicine?  This medicine is only given in a clinic, doctor's office, or other health care setting and will not be stored at home.  NOTE: This sheet is a summary. It may not cover all possible information. If you have questions about this medicine, talk to your doctor, pharmacist, or health care provider.      2016, Elsevier/Gold Standard. (2012-05-19 12:37:47)

## 2019-01-27 ENCOUNTER — Ambulatory Visit: Payer: PPO | Admitting: Physical Therapy

## 2019-02-02 ENCOUNTER — Telehealth: Payer: Self-pay | Admitting: Interventional Cardiology

## 2019-02-02 DIAGNOSIS — I480 Paroxysmal atrial fibrillation: Secondary | ICD-10-CM

## 2019-02-02 NOTE — Telephone Encounter (Signed)
Spoke with patient who states she has had more episodes of fast heart rate over the last month. She states longest episode was Thursday into Friday (lasted 12 hours) with BP of 120/70, HR 117 bpm. States it woke her up at 0200 and continued for 12 hours despite taking flecainide 50 mg and Cartia 120 mg. States she has continued flecainide 50 mg. She states she feels fine today. Had a previous episode recently that also woke her up early in the morning but did not last as long. She states Anderson Malta, Dr. Thompson Caul nurse, told her to call back if she had more episodes of a fib; I confirmed this with Dr. Thompson Caul last ov note on 10/15/18. I advised her that I will forward message to [facility] and Dr. Tamala Julian for advice and possibly soon appointment and to call back prior to that if she has additional episodes of a fib. Patient verbalized understanding and agreement with plan and thanked me for the call.

## 2019-02-02 NOTE — Telephone Encounter (Signed)
New Message          Pt c/o medication issue:  1. Name of Medication: Flecainide 50 mg   2. How are you currently taking this medication (dosage and times per day)? 2 times a day  3. Are you having a reaction (difficulty breathing--STAT)? No   4. What is your medication issue? 3 episodes with her heart rate beating fast.

## 2019-02-03 ENCOUNTER — Other Ambulatory Visit: Payer: Self-pay

## 2019-02-03 ENCOUNTER — Ambulatory Visit: Payer: PPO | Attending: Orthopedic Surgery | Admitting: Physical Therapy

## 2019-02-03 ENCOUNTER — Encounter: Payer: Self-pay | Admitting: Physical Therapy

## 2019-02-03 DIAGNOSIS — M25561 Pain in right knee: Secondary | ICD-10-CM

## 2019-02-03 DIAGNOSIS — M25661 Stiffness of right knee, not elsewhere classified: Secondary | ICD-10-CM

## 2019-02-03 DIAGNOSIS — R262 Difficulty in walking, not elsewhere classified: Secondary | ICD-10-CM | POA: Diagnosis not present

## 2019-02-03 NOTE — Therapy (Signed)
[place], [place], [address] Phone: [phone]   Fax:  [phone]  Physical Therapy Evaluation  Patient Details  Name: Katherine Powell MRN: [id number] Date of Birth: 15-Apr-1932 Referring Provider (PT): [place]   Encounter Date: 02/03/2019  PT End of Session - 02/03/19 3557    Visit Number  1    Date for PT Re-Evaluation  04/05/19    PT Start Time  0840    PT Stop Time  0924    PT Time Calculation (min)  44 min    Activity Tolerance  Patient tolerated treatment well    Behavior During Therapy  [facility] for tasks assessed/performed       Past Medical History:  Diagnosis Date  . Anxiety   . Arrhythmia    atrial fibrillation/  on   . Arthritis   . Atrial fibrillation ([place])   . Breast cancer ([place]) 11/09/13   right upper outer  . Contact lens/glasses fitting    contact rt eye  . Hx of radiation therapy 12/15/13- 01/11/14   right breast 4250 cGy in 17 sessions, (hypo-fractionated), no boost  . Hypertension   . Postmenopausal   . Rosacea   . Seasonal allergies   . Sleep disturbance   . Thyroid disease   . Urinary incontinence     Past Surgical History:  Procedure Laterality Date  . ABDOMINAL HYSTERECTOMY  1973   endometriosis  . APPENDECTOMY  1954  . BACK SURGERY     lumb-2005  . BILATERAL OOPHORECTOMY     benign tumor on ovary  . BREAST LUMPECTOMY WITH NEEDLE LOCALIZATION Right 11/09/2013   Procedure: BREAST LUMPECTOMY WITH NEEDLE LOCALIZATION;  Surgeon: Harl Bowie, MD;  Location: [place];  Service: General;  Laterality: Right;  . CATARACT EXTRACTION  2013   both  . CYSTOCELE REPAIR  2008  . EYE SURGERY     cataracts-plugs for eyes  . RECTOCELE REPAIR  2008  . TONSILLECTOMY      There were no vitals filed for this visit.  Subjective Assessment - 02/03/19 0853    Subjective  Patient has been dealing with righ tknee pain for over a year, I saw her a year  ago and she got better very fast with VMO strength and some stretching.  She returns today with a flare up of the right knee pain, the MD feels gave order for ionto and gave Voltaren gel.  She reports that she has forgotten some of the exercises    Limitations  Standing;House hold activities;Walking    Patient Stated Goals  go up and down stairs, walk normally, have less pain    Currently in Pain?  Yes    Pain Score  4     Pain Location  Knee    Pain Orientation  Right    Pain Descriptors / Indicators  Aching    Pain Type  Acute pain    Pain Onset  1 to 4 weeks ago    Pain Frequency  Constant    Aggravating Factors   stairs, morning, lifting weights pain is up to 6/10    Pain Relieving Factors  ice and rest, the injection helped pain down to 1-2/10    Effect of Pain on Daily Activities  walking, yardwork         [facility] PT Assessment - 02/03/19 0001      Assessment   Medical Diagnosis  right knee  pain    Referring Provider (PT)  [place]    Onset Date/Surgical Date  01/05/19    Prior Therapy  for right knee a year ago with great results      Precautions   Precautions  None      Balance Screen   Has the patient fallen in the past 6 months  No    Has the patient had a decrease in activity level because of a fear of falling?   No    Is the patient reluctant to leave their home because of a fear of falling?   No      Home Environment   Additional Comments  lives alone, does her own housework      Prior Function   Level of Independence  Independent    Vocation  Retired    Leisure  yoga, some gym exercises has stopped due to knee pain      AROM   Right Knee Extension  10    Right Knee Flexion  110      PROM   Right Knee Extension  5    Right Knee Flexion  110      Strength   Overall Strength Comments  4/5 with mild pain on extension      Flexibility   Soft Tissue Assessment /Muscle Length  yes    Hamstrings  tight    Quadriceps  very tight    ITB  tight    Piriformis   tight      Palpation   Palpation comment  mild tenderness patellar tendon, she has crepitus palpable      Ambulation/Gait   Gait Comments  mild antalgic on the right, no device                   [facility] Adult PT Treatment/Exercise - 02/03/19 0001      Modalities   Modalities  Iontophoresis      Iontophoresis   Type of Iontophoresis  Dexamethasone    Location  right patellar tendon area    Dose  60mA    Time  4 hour patch             PT Education - 02/03/19 0920    Education provided  Yes    Education Details  calf, HS and quad stretches    Person(s) Educated  Patient    Methods  Explanation;Demonstration;Verbal cues;Handout    Comprehension  Verbalized understanding;Returned demonstration;Verbal cues required       PT Short Term Goals - 02/03/19 0925      PT SHORT TERM GOAL #1   Title  independent with initial HEP    Time  2    Period  Weeks    Status  New        PT Long Term Goals - 02/03/19 3976      PT LONG TERM GOAL #1   Title  decrease pain 50%    Time  8    Period  Weeks    Status  New      PT LONG TERM GOAL #2   Title  increase ROM of the right knee to 0-115 degrees flexion    Time  8    Period  Weeks    Status  New      PT LONG TERM GOAL #3   Title  go up and down stairs without difficulty    Time  8    Period  Weeks  Status  New            Plan - 02/03/19 8592    Clinical Impression Statement  Patient was seen here a year ago with right knee pain, she had 4 visits and had much less knee pain and no difficulty with any ADL's.  She returns today with right knee pain over the past few months, she reports that stairs are limited.  She is tight in the calves, HS and quad.  She typically responds well to the ionto and the stretches,     Examination-Activity Limitations  Stand;Locomotion Level;Stairs    Stability/Clinical Decision Making  Stable/Uncomplicated    Clinical Decision Making  Low    Rehab Potential  Good    PT  Frequency  1x / week    PT Duration  8 weeks    PT Treatment/Interventions  ADLs/Self Care Home Management;Electrical Stimulation;Cryotherapy;Moist Heat;Therapeutic exercise;Therapeutic activities;Ultrasound;Manual techniques;Taping;Patient/family education;Iontophoresis 4mg /ml Dexamethasone    PT Next Visit Plan  patient reports that since she did so well last time she may try on her own.    PT Home Exercise Plan  gastroc/soleus stretch, HS and quade stretch    Consulted and Agree with Plan of Care  Patient       Patient will benefit from skilled therapeutic intervention in order to improve the following deficits and impairments:  Pain, Decreased activity tolerance, Decreased range of motion, Decreased strength, Impaired flexibility, Difficulty walking  Visit Diagnosis: Acute pain of right knee - Plan: PT plan of care cert/re-cert  Stiffness of right knee, not elsewhere classified - Plan: PT plan of care cert/re-cert  Difficulty in walking, not elsewhere classified - Plan: PT plan of care cert/re-cert     Problem List Patient Active Problem List   Diagnosis Date Noted  . Thyroid nodule 02/11/2017  . Osteopenia 07/18/2016  . Chronic anticoagulation 12/29/2014  . Encounter for monitoring flecainide therapy 12/29/2014  . Paroxysmal atrial fibrillation ([place]) 12/29/2014  . Postmenopausal estrogen deficiency 01/27/2014  . Hot flashes, menopausal 01/27/2014  . Malignant neoplasm of upper-outer quadrant of right breast in female, estrogen receptor positive ([place]) 11/17/2013    [name], PT 02/03/2019, 9:27 AM  [place], [place], [address] Phone: [phone]   Fax:  [phone]  Name: ALBERTHA BEATTIE MRN: [id number] Date of Birth: 1932/02/17

## 2019-02-05 NOTE — Telephone Encounter (Signed)
Mrs. Katherine Powell will need to have an electrophysiology appointment concerning arrhythmia management.

## 2019-02-06 NOTE — Telephone Encounter (Signed)
Pt returned call and scheduler put her through to me but I was getting no response.  Hung up and tried to call pt back but no one answered and VM did not pick up.  Will try again later.

## 2019-02-06 NOTE — Telephone Encounter (Signed)
Left message to call back  

## 2019-02-06 NOTE — Telephone Encounter (Signed)
Spoke with pt and made her aware of Dr. Thompson Caul recommendations.  Pt agreeable to see EP.  She has seen Dr. Caryl Comes in the past and would like to see him again.  Advised I will put in the referral and have the scheduler give her a call.  Pt appreciative for call.

## 2019-02-10 DIAGNOSIS — B351 Tinea unguium: Secondary | ICD-10-CM | POA: Diagnosis not present

## 2019-02-10 DIAGNOSIS — N941 Unspecified dyspareunia: Secondary | ICD-10-CM | POA: Diagnosis not present

## 2019-02-11 ENCOUNTER — Telehealth: Payer: Self-pay

## 2019-02-11 NOTE — Telephone Encounter (Signed)
Returned patient's call regarding questions about Anastrozole.    Pt asking how much longer she will have to take medication.  Per recommendations, okay to discontinue medication due to 5 year completion.  Pt voiced understanding, no further needs at this time.

## 2019-02-12 NOTE — Progress Notes (Signed)
ELECTROPHYSIOLOGY CONSULT NOTE  Patient ID: Katherine Powell, MRN: [id number], DOB/AGE: [birth year] 85 y.o. Admit date: (Not on file) Date of Consult: 02/13/2019  Primary Physician: Cari Caraway, MD Primary Cardiologist: [facility]     Katherine Powell is a 85 y.o. female who is being seen today for the evaluation of Afib at the request of [facility].    HPI Katherine Powell is a 85 y.o. female with longstanding AF-paroxysmal managed with low dose flecainide and anticoagulation w apixoban   ECG also notable for sinus brady ( 50s)  Over recent months increasingly frequent episodes characterized by fatigue and lightheadedness and lassitude.  Episodes last 8-14 hours.  Apart from these, The patient denies chest pain, shortness of breath, nocturnal dyspnea, orthopnea or peripheral edema.  There have been no palpitations, lightheadedness or syncope  She has been managed with flecainide at variable doses over the years.  She found that 100 mg twice daily was associated with significant fatigue prompting her to down titrate on her own.  No bleeding on the Eliquis.[name]      DATE TEST EF   2/17 Echo   60-65 % (34/1.9/22)  6/19 Echo   65 % (33/1.8/25)        DATE PR interval QRSduration Dose  9/14  154 98 0 ( I think)  11/19 170 108 50/100   Date Cr K Hgb  2/20 1.04 3.9 11.3          Thromboembolic risk factors ( age  -2, HTN-1, TIA/CVA-2, DM-1, Gender-1) for a CHADSVASc Score of >=6    Past Medical History:  Diagnosis Date  . Anxiety   . Arrhythmia    atrial fibrillation/  on   . Arthritis   . Atrial fibrillation ([place])   . Breast cancer ([place]) 11/09/13   right upper outer  . Contact lens/glasses fitting    contact rt eye  . Hx of radiation therapy 12/15/13- 01/11/14   right breast 4250 cGy in 17 sessions, (hypo-fractionated), no boost  . Hypertension   . Postmenopausal   . Rosacea   . Seasonal allergies   . Sleep disturbance   . Thyroid disease   . Urinary incontinence        Surgical History:  Past Surgical History:  Procedure Laterality Date  . ABDOMINAL HYSTERECTOMY  1973   endometriosis  . APPENDECTOMY  1954  . BACK SURGERY     lumb-2005  . BILATERAL OOPHORECTOMY     benign tumor on ovary  . BREAST LUMPECTOMY WITH NEEDLE LOCALIZATION Right 11/09/2013   Procedure: BREAST LUMPECTOMY WITH NEEDLE LOCALIZATION;  Surgeon: Harl Bowie, MD;  Location: [place];  Service: General;  Laterality: Right;  . CATARACT EXTRACTION  2013   both  . CYSTOCELE REPAIR  2008  . EYE SURGERY     cataracts-plugs for eyes  . RECTOCELE REPAIR  2008  . TONSILLECTOMY       Home Meds: Current Meds  Medication Sig  . acetaminophen (TYLENOL) 650 MG CR tablet Take 650 mg by mouth every 8 (eight) hours as needed for pain. Reported on 01/19/2016  . apixaban (ELIQUIS) 5 MG TABS tablet Take 1 tablet (5 mg total) by mouth 2 (two) times daily.  . Calcium-Phosphorus-Vitamin D (CITRACAL +D3 PO) Take 1 tablet by mouth daily.  [name] CARTIA XT 120 MG 24 hr capsule Take 120 mg by mouth every evening.   . cholecalciferol (VITAMIN D) 400 UNITS TABS tablet Take 400 Units  by mouth 2 (two) times daily.   . cloNIDine (CATAPRES) 0.1 MG tablet TAKE 1 TABLET (0.1 MG TOTAL) BY MOUTH 2 (TWO) TIMES DAILY AS NEEDED.  [name] denosumab (PROLIA) 60 MG/ML SOSY injection Inject 60 mg into the skin every 6 (six) months.  . flecainide (TAMBOCOR) 50 MG tablet Take 1 tablet by mouth 2 (two) times daily.  . fluticasone (FLONASE) 50 MCG/ACT nasal spray Place 1 spray into both nostrils daily as needed for allergies. Reported on 01/19/2016  . glucosamine-chondroitin 500-400 MG tablet Take 1 tablet by mouth 2 (two) times daily.  . Multiple Vitamins-Minerals (PRESERVISION AREDS 2+MULTI VIT PO) Take 1 capsule by mouth 2 (two) times daily.  . Omega-3 Fatty Acids (EQL OMEGA 3 FISH OIL) 1400 MG CAPS Take 1 capsule by mouth daily.  . sodium chloride (OCEAN) 0.65 % nasal spray Place 1 spray into the nose as needed  for congestion. Reported on 01/19/2016    Allergies:  Allergies  Allergen Reactions  . Codeine Nausea Only  . Gabapentin Other (See Comments)    Makes her very sleepy    Social History   Socioeconomic History  . Marital status: Married    Spouse name: Not on file  . Number of children: Not on file  . Years of education: Not on file  . Highest education level: Not on file  Occupational History  . Not on file  Social Needs  . Financial resource strain: Not on file  . Food insecurity:    Worry: Not on file    Inability: Not on file  . Transportation needs:    Medical: Not on file    Non-medical: Not on file  Tobacco Use  . Smoking status: Never Smoker  . Smokeless tobacco: Never Used  Substance and Sexual Activity  . Alcohol use: No  . Drug use: No  . Sexual activity: Never    Comment: menarche age 56, P90, first live birth age 75,  HRT x 30 years  Lifestyle  . Physical activity:    Days per week: Not on file    Minutes per session: Not on file  . Stress: Not on file  Relationships  . Social connections:    Talks on phone: Not on file    Gets together: Not on file    Attends religious service: Not on file    Active member of club or organization: Not on file    Attends meetings of clubs or organizations: Not on file    Relationship status: Not on file  . Intimate partner violence:    Fear of current or ex partner: Not on file    Emotionally abused: Not on file    Physically abused: Not on file    Forced sexual activity: Not on file  Other Topics Concern  . Not on file  Social History Narrative  . Not on file     Family History  Problem Relation Age of Onset  . Alzheimer's disease Mother   . Stroke Father   . Heart attack Neg Hx      ROS:  Please see the history of present illness.     All other systems reviewed and negative.    Physical Exam: Blood pressure 130/84, pulse (!) 56, height 5\' 7"  (1.702 m), weight 156 lb 9.6 oz (71 kg), SpO2 97 %.  General: Well developed, well nourished female in no acute distress. Head: Normocephalic, atraumatic, sclera non-icteric, no xanthomas, nares are without discharge. EENT: normal  Lymph Nodes:  none Neck: Negative for carotid bruits. JVD not elevated. Back:without scoliosis kyphosis Lungs: Clear bilaterally to auscultation without wheezes, rales, or rhonchi. Breathing is unlabored. Heart: RRR with S1 S2. No  murmur . No rubs, or gallops appreciated. Abdomen: Soft, non-tender, non-distended with normoactive bowel sounds. No hepatomegaly. No rebound/guarding. No obvious abdominal masses. Msk:  Strength and tone appear normal for age. Extremities: No clubbing or cyanosis. No  edema.  Distal pedal pulses are 2+ and equal bilaterally. Skin: Warm and Dry Neuro: Alert and oriented X 3. CN III-XII intact Grossly normal sensory and motor function . Psych:  Responds to questions appropriately with a normal affect.      Labs: Cardiac Enzymes No results for input(s): CKTOTAL, CKMB, TROPONINI in the last 72 hours. CBC Lab Results  Component Value Date   WBC 5.6 01/26/2019   HGB 11.6 (L) 01/26/2019   HCT 35.6 (L) 01/26/2019   MCV 97.0 01/26/2019   PLT 184 01/26/2019   PROTIME: No results for input(s): LABPROT, INR in the last 72 hours. Chemistry No results for input(s): NA, K, CL, CO2, BUN, CREATININE, CALCIUM, PROT, BILITOT, ALKPHOS, ALT, AST, GLUCOSE in the last 168 hours.  Invalid input(s): LABALBU Lipids No results found for: CHOL, HDL, LDLCALC, TRIG BNP No results found for: PROBNP Thyroid Function Tests: No results for input(s): TSH, T4TOTAL, T3FREE, THYROIDAB in the last 72 hours.  Invalid input(s): FREET3 Miscellaneous No results found for: DDIMER  Radiology/Studies:  No results found.  EKG:  sinus at 56 Interval 17/10/42   Assessment and Plan:  No interval Patient has rapid atrial fibrillation occurring in paroxysms which are self terminating in the context of ongoing  flecainide therapy.  Apart from that she has very few limiting symptoms.  No bleeding on anticoagulation.  Treatment changes are invited because of increased frequency of events in the month of February.  We discussed a variety of treatment strategies.  1-continue doing what she is doing avoiding uptitrated flecainide because of fatigue 2-uptitrating from 50-75 hoping to get improved control without worsening fatigue 3-alternative antiarrhythmic therapy.  Propafenone and amiodarone might incur more bradycardia inviting the need for pacemaker; alternatively her QT interval is consistent with the use of dofetilide, 4-consideration of catheter ablation not withstanding her age  After lengthy discussion we have elected to pursue #1.  We will see her about 10-12 weeks    Virl Axe

## 2019-02-13 ENCOUNTER — Encounter: Payer: Self-pay | Admitting: Internal Medicine

## 2019-02-13 ENCOUNTER — Ambulatory Visit: Payer: PPO | Admitting: Internal Medicine

## 2019-02-13 ENCOUNTER — Other Ambulatory Visit: Payer: Self-pay

## 2019-02-13 VITALS — BP 130/84 | HR 56 | Ht 67.0 in | Wt 156.6 lb

## 2019-02-13 DIAGNOSIS — I480 Paroxysmal atrial fibrillation: Secondary | ICD-10-CM | POA: Diagnosis not present

## 2019-02-13 NOTE — Patient Instructions (Signed)
Medication Instructions:  Your physician recommends that you continue on your current medications as directed. Please refer to the Current Medication list given to you today.  Labwork: None ordered.  Testing/Procedures: None ordered.  Follow-Up: Your physician recommends that you schedule a follow-up appointment in:   10-12 weeks with Dr Caryl Comes  Any Other Special Instructions Will Be Listed Below (If Applicable).     If you need a refill on your cardiac medications before your next appointment, please call your pharmacy.

## 2019-02-17 ENCOUNTER — Ambulatory Visit: Payer: Self-pay | Admitting: Podiatry

## 2019-02-18 DIAGNOSIS — H0102B Squamous blepharitis left eye, upper and lower eyelids: Secondary | ICD-10-CM | POA: Diagnosis not present

## 2019-02-18 DIAGNOSIS — H02052 Trichiasis without entropian right lower eyelid: Secondary | ICD-10-CM | POA: Diagnosis not present

## 2019-02-18 DIAGNOSIS — Z961 Presence of intraocular lens: Secondary | ICD-10-CM | POA: Diagnosis not present

## 2019-02-18 DIAGNOSIS — H353131 Nonexudative age-related macular degeneration, bilateral, early dry stage: Secondary | ICD-10-CM | POA: Diagnosis not present

## 2019-02-18 DIAGNOSIS — H43813 Vitreous degeneration, bilateral: Secondary | ICD-10-CM | POA: Diagnosis not present

## 2019-02-18 DIAGNOSIS — H0102A Squamous blepharitis right eye, upper and lower eyelids: Secondary | ICD-10-CM | POA: Diagnosis not present

## 2019-02-18 DIAGNOSIS — H26492 Other secondary cataract, left eye: Secondary | ICD-10-CM | POA: Diagnosis not present

## 2019-02-18 DIAGNOSIS — H04123 Dry eye syndrome of bilateral lacrimal glands: Secondary | ICD-10-CM | POA: Diagnosis not present

## 2019-03-19 DIAGNOSIS — C50911 Malignant neoplasm of unspecified site of right female breast: Secondary | ICD-10-CM | POA: Diagnosis not present

## 2019-03-19 DIAGNOSIS — N183 Chronic kidney disease, stage 3 (moderate): Secondary | ICD-10-CM | POA: Diagnosis not present

## 2019-03-19 DIAGNOSIS — I480 Paroxysmal atrial fibrillation: Secondary | ICD-10-CM | POA: Diagnosis not present

## 2019-03-19 DIAGNOSIS — I10 Essential (primary) hypertension: Secondary | ICD-10-CM | POA: Diagnosis not present

## 2019-04-14 DIAGNOSIS — D225 Melanocytic nevi of trunk: Secondary | ICD-10-CM | POA: Diagnosis not present

## 2019-04-14 DIAGNOSIS — C4441 Basal cell carcinoma of skin of scalp and neck: Secondary | ICD-10-CM | POA: Diagnosis not present

## 2019-04-14 DIAGNOSIS — L570 Actinic keratosis: Secondary | ICD-10-CM | POA: Diagnosis not present

## 2019-04-14 DIAGNOSIS — L821 Other seborrheic keratosis: Secondary | ICD-10-CM | POA: Diagnosis not present

## 2019-04-14 DIAGNOSIS — D485 Neoplasm of uncertain behavior of skin: Secondary | ICD-10-CM | POA: Diagnosis not present

## 2019-04-22 DIAGNOSIS — H0102B Squamous blepharitis left eye, upper and lower eyelids: Secondary | ICD-10-CM | POA: Diagnosis not present

## 2019-04-22 DIAGNOSIS — H43813 Vitreous degeneration, bilateral: Secondary | ICD-10-CM | POA: Diagnosis not present

## 2019-04-22 DIAGNOSIS — H04123 Dry eye syndrome of bilateral lacrimal glands: Secondary | ICD-10-CM | POA: Diagnosis not present

## 2019-04-22 DIAGNOSIS — H10021 Other mucopurulent conjunctivitis, right eye: Secondary | ICD-10-CM | POA: Diagnosis not present

## 2019-04-22 DIAGNOSIS — Z961 Presence of intraocular lens: Secondary | ICD-10-CM | POA: Diagnosis not present

## 2019-04-22 DIAGNOSIS — H02052 Trichiasis without entropian right lower eyelid: Secondary | ICD-10-CM | POA: Diagnosis not present

## 2019-04-22 DIAGNOSIS — H0102A Squamous blepharitis right eye, upper and lower eyelids: Secondary | ICD-10-CM | POA: Diagnosis not present

## 2019-04-23 DIAGNOSIS — T2220XD Burn of second degree of shoulder and upper limb, except wrist and hand, unspecified site, subsequent encounter: Secondary | ICD-10-CM | POA: Diagnosis not present

## 2019-04-24 DIAGNOSIS — H10021 Other mucopurulent conjunctivitis, right eye: Secondary | ICD-10-CM | POA: Diagnosis not present

## 2019-04-24 DIAGNOSIS — H02052 Trichiasis without entropian right lower eyelid: Secondary | ICD-10-CM | POA: Diagnosis not present

## 2019-04-29 ENCOUNTER — Telehealth: Payer: Self-pay

## 2019-04-29 NOTE — Telephone Encounter (Signed)
I called and left patient a message about upcoming appointment on 05/06/19, we need to switch this to telehealth visit.

## 2019-04-29 NOTE — Telephone Encounter (Signed)
Patient returned my call, she is ok with switching office visit to telehealth visit.     Virtual Visit Pre-Appointment Phone Call  "(Name), I am calling you today to discuss your upcoming appointment. We are currently trying to limit exposure to the virus that causes COVID-19 by seeing patients at home rather than in the office."  1. "What is the BEST phone number to call the day of the visit?" - include this in appointment notes  2. "Do you have or have access to (through a family member/friend) a smartphone with video capability that we can use for your visit?" a. If yes - list this number in appt notes as "cell" (if different from BEST phone #) and list the appointment type as a VIDEO visit in appointment notes b. If no - list the appointment type as a PHONE visit in appointment notes  3. Confirm consent - "In the setting of the current Covid19 crisis, you are scheduled for a (phone or video) visit with your provider on (date) at (time).  Just as we do with many in-office visits, in order for you to participate in this visit, we must obtain consent.  If you'd like, I can send this to your mychart (if signed up) or email for you to review.  Otherwise, I can obtain your verbal consent now.  All virtual visits are billed to your insurance company just like a normal visit would be.  By agreeing to a virtual visit, we'd like you to understand that the technology does not allow for your provider to perform an examination, and thus may limit your provider's ability to fully assess your condition. If your provider identifies any concerns that need to be evaluated in person, we will make arrangements to do so.  Finally, though the technology is pretty good, we cannot assure that it will always work on either your or our end, and in the setting of a video visit, we may have to convert it to a phone-only visit.  In either situation, we cannot ensure that we have a secure connection.  Are you willing to  proceed?" STAFF: Did the patient verbally acknowledge consent to telehealth visit? Document YES/NO here: YES  4. Advise patient to be prepared - "Two hours prior to your appointment, go ahead and check your blood pressure, pulse, oxygen saturation, and your weight (if you have the equipment to check those) and write them all down. When your visit starts, your provider will ask you for this information. If you have an Apple Watch or Kardia device, please plan to have heart rate information ready on the day of your appointment. Please have a pen and paper handy nearby the day of the visit as well."  5. Give patient instructions for MyChart download to smartphone OR Doximity/Doxy.me as below if video visit (depending on what platform provider is using)  6. Inform patient they will receive a phone call 15 minutes prior to their appointment time (may be from unknown caller ID) so they should be prepared to answer    TELEPHONE CALL NOTE  Katherine Powell has been deemed a candidate for a follow-up tele-health visit to limit community exposure during the Covid-19 pandemic. I spoke with the patient via phone to ensure availability of phone/video source, confirm preferred email & phone number, and discuss instructions and expectations.  I reminded Katherine Powell to be prepared with any vital sign and/or heart rhythm information that could potentially be obtained via home monitoring, at the  time of her visit. I reminded Katherine Powell to expect a phone call prior to her visit.  Mady Haagensen, [place] 04/29/2019 3:36 PM   INSTRUCTIONS FOR DOWNLOADING THE MYCHART APP TO SMARTPHONE  - The patient must first make sure to have activated MyChart and know their login information - If Apple, go to [employer] and type in MyChart in the search bar and download the app. If Android, ask patient to go to [employer] and type in [place] in the search bar and download the app. The app is free but as with any other app  downloads, their phone may require them to verify saved payment information or Apple/Android password.  - The patient will need to then log into the app with their MyChart username and password, and select [facility] as their healthcare provider to link the account. When it is time for your visit, go to the MyChart app, find appointments, and click Begin Video Visit. Be sure to Select Allow for your device to access the Microphone and Camera for your visit. You will then be connected, and your provider will be with you shortly.  **If they have any issues connecting, or need assistance please contact MyChart service desk [phone])**  **If using a computer, in order to ensure the best quality for their visit they will need to use either of the following Internet Browsers: [employer], or Google Chrome**  IF USING DOXIMITY or DOXY.ME - The patient will receive a link just prior to their visit by text.     FULL LENGTH CONSENT FOR TELE-HEALTH VISIT   I hereby voluntarily request, consent and authorize [facility] and its employed or contracted physicians, physician assistants, nurse practitioners or other licensed health care professionals (the Practitioner), to provide me with telemedicine health care services (the "Services") as deemed necessary by the treating Practitioner. I acknowledge and consent to receive the Services by the Practitioner via telemedicine. I understand that the telemedicine visit will involve communicating with the Practitioner through live audiovisual communication technology and the disclosure of certain medical information by electronic transmission. I acknowledge that I have been given the opportunity to request an in-person assessment or other available alternative prior to the telemedicine visit and am voluntarily participating in the telemedicine visit.  I understand that I have the right to withhold or withdraw my consent to the use of telemedicine  in the course of my care at any time, without affecting my right to future care or treatment, and that the Practitioner or I may terminate the telemedicine visit at any time. I understand that I have the right to inspect all information obtained and/or recorded in the course of the telemedicine visit and may receive copies of available information for a reasonable fee.  I understand that some of the potential risks of receiving the Services via telemedicine include:  [name] Delay or interruption in medical evaluation due to technological equipment failure or disruption; . Information transmitted may not be sufficient (e.g. poor resolution of images) to allow for appropriate medical decision making by the Practitioner; and/or  . In rare instances, security protocols could fail, causing a breach of personal health information.  Furthermore, I acknowledge that it is my responsibility to provide information about my medical history, conditions and care that is complete and accurate to the best of my ability. I acknowledge that Practitioner's advice, recommendations, and/or decision may be based on factors not within their control, such as incomplete or inaccurate data  provided by me or distortions of diagnostic images or specimens that may result from electronic transmissions. I understand that the practice of medicine is not an exact science and that Practitioner makes no warranties or guarantees regarding treatment outcomes. I acknowledge that I will receive a copy of this consent concurrently upon execution via email to the email address I last provided but may also request a printed copy by calling the office of [facility].    I understand that my insurance will be billed for this visit.   I have read or had this consent read to me. . I understand the contents of this consent, which adequately explains the benefits and risks of the Services being provided via telemedicine.  . I have been provided ample  opportunity to ask questions regarding this consent and the Services and have had my questions answered to my satisfaction. . I give my informed consent for the services to be provided through the use of telemedicine in my medical care  By participating in this telemedicine visit I agree to the above.

## 2019-05-06 ENCOUNTER — Telehealth (INDEPENDENT_AMBULATORY_CARE_PROVIDER_SITE_OTHER): Payer: PPO | Admitting: Internal Medicine

## 2019-05-06 ENCOUNTER — Other Ambulatory Visit: Payer: Self-pay

## 2019-05-06 ENCOUNTER — Encounter: Payer: Self-pay | Admitting: Internal Medicine

## 2019-05-06 VITALS — BP 117/69 | HR 74 | Ht 67.0 in | Wt 155.0 lb

## 2019-05-06 DIAGNOSIS — I480 Paroxysmal atrial fibrillation: Secondary | ICD-10-CM | POA: Diagnosis not present

## 2019-05-06 DIAGNOSIS — G459 Transient cerebral ischemic attack, unspecified: Secondary | ICD-10-CM

## 2019-05-06 DIAGNOSIS — Z7901 Long term (current) use of anticoagulants: Secondary | ICD-10-CM

## 2019-05-06 NOTE — Progress Notes (Signed)
Electrophysiology TeleHealth Note   Due to national recommendations of social distancing due to COVID 19, an audio/video telehealth visit is felt to be most appropriate for this patient at this time.  See MyChart message from today for the patient's consent to telehealth for [facility].   Date:  05/06/2019   ID:  Katherine Powell, DOB Oct 27, 1932, MRN [id number]  Location: patient's home  Provider location: [address], [place]  Evaluation Performed: Follow-up visit  PCP:  Cari Caraway, MD  Cardiologist:   [facility] Electrophysiologist:  [facility]   Chief Complaint:  afib  History of Present Illness:    Katherine Powell is a 85 y.o. female who presents via audio/video conferencing for a telehealth visit today.  Since last being seen in our clinic for atrial fibrillation- paroxysms  the patient reports  Doing well   DATE TEST EF   2/17 Echo   60-65 % (34/1.9/22)  6/19 Echo   65 % (33/1.8/25)        DATE PR interval QRSduration Dose  9/14  154 98 0 ( I think)  11/19 170 108 50/100   Date Cr K Hgb  2/20 1.04 3.9 11.3         Three interval episodes of afib, 3h, 6+6 hrs,  Takes xanax and extra flecainide   Otherwise  denies SOB, chest pain edema or palpitations.  There has been no syncope or presyncope.   No bleeding   The patient denies symptoms of fevers, chills, cough, or new SOB worrisome for COVID 19.    Past Medical History:  Diagnosis Date  . Anxiety   . Arrhythmia    atrial fibrillation/  on   . Arthritis   . Atrial fibrillation ([place])   . Breast cancer ([place]) 11/09/13   right upper outer  . Contact lens/glasses fitting    contact rt eye  . Hx of radiation therapy 12/15/13- 01/11/14   right breast 4250 cGy in 17 sessions, (hypo-fractionated), no boost  . Hypertension   . Postmenopausal   . Rosacea   . Seasonal allergies   . Sleep disturbance   . Thyroid disease   . Urinary incontinence     Past Surgical History:  Procedure  Laterality Date  . ABDOMINAL HYSTERECTOMY  1973   endometriosis  . APPENDECTOMY  1954  . BACK SURGERY     lumb-2005  . BILATERAL OOPHORECTOMY     benign tumor on ovary  . BREAST LUMPECTOMY WITH NEEDLE LOCALIZATION Right 11/09/2013   Procedure: BREAST LUMPECTOMY WITH NEEDLE LOCALIZATION;  Surgeon: Harl Bowie, MD;  Location: [facility];  Service: General;  Laterality: Right;  . CATARACT EXTRACTION  2013   both  . CYSTOCELE REPAIR  2008  . EYE SURGERY     cataracts-plugs for eyes  . RECTOCELE REPAIR  2008  . TONSILLECTOMY      Current Outpatient Medications  Medication Sig Dispense Refill  . acetaminophen (TYLENOL) 650 MG CR tablet Take 650 mg by mouth every 8 (eight) hours as needed for pain. Reported on 01/19/2016    . apixaban (ELIQUIS) 5 MG TABS tablet Take 1 tablet (5 mg total) by mouth 2 (two) times daily. 60 tablet   . Calcium-Phosphorus-Vitamin D (CITRACAL +D3 PO) Take 1 tablet by mouth daily.    [name] CARTIA XT 120 MG 24 hr capsule Take 120 mg by mouth every evening.     . cholecalciferol (VITAMIN D) 400 UNITS TABS tablet Take  400 Units by mouth 2 (two) times daily.     . cloNIDine (CATAPRES) 0.1 MG tablet TAKE 1 TABLET (0.1 MG TOTAL) BY MOUTH 2 (TWO) TIMES DAILY AS NEEDED. 180 tablet 0  . flecainide (TAMBOCOR) 50 MG tablet Take 1 tablet by mouth 2 (two) times daily.    . fluticasone (FLONASE) 50 MCG/ACT nasal spray Place 1 spray into both nostrils daily as needed for allergies. Reported on 01/19/2016    . glucosamine-chondroitin 500-400 MG tablet Take 1 tablet by mouth 2 (two) times daily.    . Multiple Vitamins-Minerals (PRESERVISION AREDS 2+MULTI VIT PO) Take 1 capsule by mouth 2 (two) times daily.    . Omega-3 Fatty Acids (EQL OMEGA 3 FISH OIL) 1400 MG CAPS Take 1 capsule by mouth daily.    . sodium chloride (OCEAN) 0.65 % nasal spray Place 1 spray into the nose as needed for congestion. Reported on 01/19/2016     No current facility-administered medications  for this visit.     Allergies:   Codeine and Gabapentin   Social History:  The patient  reports that she has never smoked. She has never used smokeless tobacco. She reports that she does not drink alcohol or use drugs.   Family History:  The patient's   family history includes Alzheimer's disease in her mother; Stroke in her father.   ROS:  Please see the history of present illness.   All other systems are personally reviewed and negative.    Exam:    Vital Signs:  BP 117/69   Pulse 74   Ht 5\' 7"  (1.702 m)   Wt 155 lb (70.3 kg)   BMI 24.28 kg/m     Well appearing, alert and conversant, regular work of breathing,  good skin color Eyes- anicteric, neuro- grossly intact, skin- no apparent rash or lesions or cyanosis, mouth- oral mucosa is pink   Labs/Other Tests and Data Reviewed:    Recent Labs: 01/26/2019: ALT 13; BUN 20; Creatinine, Ser 1.04; Hemoglobin 11.6; Platelets 184; Potassium 3.9; Sodium 136   Wt Readings from Last 3 Encounters:  05/06/19 155 lb (70.3 kg)  02/13/19 156 lb 9.6 oz (71 kg)  01/26/19 154 lb 8 oz (70.1 kg)     Other studies personally reviewed: Additional studies/ records that were reviewed today include:    ASSESSMENT & PLAN:    Atrial Fibrillation    Continues with infrequent atrial fib;  At this point she would like to maintain her current course  We will be glad to see hear again as needed  She has recall followup scheduled with Dr [facility] in 11/20   COVID 19 screen The patient denies symptoms of COVID 19 at this time.  The importance of social distancing was discussed today.  Follow-up:  Prn     Current medicines are reviewed at length with the patient today.   The patient does not have concerns regarding her medicines.  The following changes were made today:  none  Labs/ tests ordered today include:   No orders of the defined types were placed in this encounter.   Future tests ( post COVID )    Patient Risk:  after full review of  this patients clinical status, I feel that they are at moderate  risk at this time.  Today, I have spent 8 minutes with the patient with telehealth technology discussing the above.  Signed, Virl Axe, MD  05/06/2019 10:12 AM     [place]  [address] [place] [address] [phone] (office) [phone] (fax)

## 2019-05-06 NOTE — Patient Instructions (Signed)
Medication Instructions:  Your physician recommends that you continue on your current medications as directed. Please refer to the Current Medication list given to you today.  Labwork: None ordered.  Testing/Procedures: None ordered.  Follow-Up: Your physician recommends that you schedule a follow-up appointment as needed with Dr Klein  Any Other Special Instructions Will Be Listed Below (If Applicable).     If you need a refill on your cardiac medications before your next appointment, please call your pharmacy.  

## 2019-05-22 ENCOUNTER — Ambulatory Visit (INDEPENDENT_AMBULATORY_CARE_PROVIDER_SITE_OTHER): Payer: PPO

## 2019-05-22 ENCOUNTER — Encounter: Payer: Self-pay | Admitting: Podiatry

## 2019-05-22 ENCOUNTER — Ambulatory Visit: Payer: PPO | Admitting: Podiatry

## 2019-05-22 ENCOUNTER — Other Ambulatory Visit: Payer: Self-pay

## 2019-05-22 DIAGNOSIS — L603 Nail dystrophy: Secondary | ICD-10-CM

## 2019-05-22 DIAGNOSIS — B351 Tinea unguium: Secondary | ICD-10-CM | POA: Diagnosis not present

## 2019-05-22 DIAGNOSIS — M2042 Other hammer toe(s) (acquired), left foot: Secondary | ICD-10-CM

## 2019-05-29 NOTE — Progress Notes (Signed)
Subjective:   Patient ID: Katherine Powell, female   DOB: 85 y.o.   MRN: [id number]   HPI 85 year old female presents the office today for concerns of toenail fungus as well as her big toe crossing over the second toe.  She has been using over-the-counter Fungi-Nail which is been helping some.  No significant discomfort.  She states that both her toenails removed about 15 years ago and is loose and is not growing.  She has no significant pain to the nail but sometimes with pressure.  No redness or drainage or any swelling.   Review of Systems  All other systems reviewed and are negative.  Past Medical History:  Diagnosis Date  . Anxiety   . Arrhythmia    atrial fibrillation/  on   . Arthritis   . Atrial fibrillation ([place])   . Breast cancer ([place]) 11/09/13   right upper outer  . Contact lens/glasses fitting    contact rt eye  . Hx of radiation therapy 12/15/13- 01/11/14   right breast 4250 cGy in 17 sessions, (hypo-fractionated), no boost  . Hypertension   . Postmenopausal   . Rosacea   . Seasonal allergies   . Sleep disturbance   . Thyroid disease   . Urinary incontinence     Past Surgical History:  Procedure Laterality Date  . ABDOMINAL HYSTERECTOMY  1973   endometriosis  . APPENDECTOMY  1954  . BACK SURGERY     lumb-2005  . BILATERAL OOPHORECTOMY     benign tumor on ovary  . BREAST LUMPECTOMY WITH NEEDLE LOCALIZATION Right 11/09/2013   Procedure: BREAST LUMPECTOMY WITH NEEDLE LOCALIZATION;  Surgeon: Harl Bowie, MD;  Location: [facility];  Service: General;  Laterality: Right;  . CATARACT EXTRACTION  2013   both  . CYSTOCELE REPAIR  2008  . EYE SURGERY     cataracts-plugs for eyes  . RECTOCELE REPAIR  2008  . TONSILLECTOMY       Current Outpatient Medications:  .  acetaminophen (TYLENOL) 650 MG CR tablet, Take 650 mg by mouth every 8 (eight) hours as needed for pain. Reported on 01/19/2016, Disp: , Rfl:  .  apixaban (ELIQUIS) 5 MG TABS tablet,  Take 1 tablet (5 mg total) by mouth 2 (two) times daily., Disp: 60 tablet, Rfl:  .  Calcium-Phosphorus-Vitamin D (CITRACAL +D3 PO), Take 1 tablet by mouth daily., Disp: , Rfl:  .  CARTIA XT 120 MG 24 hr capsule, Take 120 mg by mouth every evening. , Disp: , Rfl:  .  cholecalciferol (VITAMIN D) 400 UNITS TABS tablet, Take 400 Units by mouth 2 (two) times daily. , Disp: , Rfl:  .  cloNIDine (CATAPRES) 0.1 MG tablet, TAKE 1 TABLET (0.1 MG TOTAL) BY MOUTH 2 (TWO) TIMES DAILY AS NEEDED., Disp: 180 tablet, Rfl: 0 .  flecainide (TAMBOCOR) 50 MG tablet, Take 1 tablet by mouth 2 (two) times daily., Disp: , Rfl:  .  fluticasone (FLONASE) 50 MCG/ACT nasal spray, Place 1 spray into both nostrils daily as needed for allergies. Reported on 01/19/2016, Disp: , Rfl:  .  glucosamine-chondroitin 500-400 MG tablet, Take 1 tablet by mouth 2 (two) times daily., Disp: , Rfl:  .  Multiple Vitamins-Minerals (PRESERVISION AREDS 2+MULTI VIT PO), Take 1 capsule by mouth 2 (two) times daily., Disp: , Rfl:  .  Omega-3 Fatty Acids (EQL OMEGA 3 FISH OIL) 1400 MG CAPS, Take 1 capsule by mouth daily., Disp: , Rfl:  .  sodium chloride (OCEAN) 0.65 %  nasal spray, Place 1 spray into the nose as needed for congestion. Reported on 01/19/2016, Disp: , Rfl:   Allergies  Allergen Reactions  . Codeine Nausea Only  . Gabapentin Other (See Comments)    Makes her very sleepy          Objective:  Physical Exam  General: AAO x3, NAD  Dermatological: Left hallux as well as second digit toenail is hypertrophic, dystrophic with yellow-brown discoloration and is damaged.  There is no pain in the nails there is no swelling redness or drainage or signs of infection noted today.  There is no open lesions identified.  Vascular: Dorsalis Pedis artery and Posterior Tibial artery pedal pulses are 2/4 bilateral with immedate capillary fill time. Pedal hair growth present. No varicosities and no lower extremity edema present bilateral. There is no  pain with calf compression, swelling, warmth, erythema.   Neruologic: Grossly intact via light touch bilateral. V Protective threshold with Semmes Wienstein monofilament intact to all pedal sites bilateral.   Musculoskeletal: Medial deviation of the lesser digits.  There is no movement of the hallux IPJ on the left side.  There is no area of tenderness identified otherwise no edema, erythema.  Muscular strength 5/5 in all groups tested bilateral.  Gait: Unassisted, Nonantalgic.      Assessment:   Onychodystrophy, onychomycosis with digital deformity     Plan:  -Treatment options discussed including all alternatives, risks, and complications -Etiology of symptoms were discussed -X-rays were obtained and reviewed with the patient. Arthritic changes present right hallux IPJ.  Medial deviation of the lesser digits with hammertoe contractures present. -I debrided the nail plate any complications or bleeding.  She states that she will get a pedicure.  Discussed treatment options for the dystrophy of the nail avulsed nail fungus.  She is to continue with the over-the-counter Fungi-Nail for now.  Discussed possibly a urea based cream to help with the thickening.  In regards to digital clubbing and the second toe starting to lean on the hallux dispensed toe separators.  Trula Slade DPM

## 2019-06-03 DIAGNOSIS — H26492 Other secondary cataract, left eye: Secondary | ICD-10-CM | POA: Diagnosis not present

## 2019-07-19 NOTE — Progress Notes (Signed)
Virtual Visit via Video Note   This visit type was conducted due to national recommendations for restrictions regarding the COVID-19 Pandemic (e.g. social distancing) in an effort to limit this patient's exposure and mitigate transmission in our community.  Due to her co-morbid illnesses, this patient is at least at moderate risk for complications without adequate follow up.  This format is felt to be most appropriate for this patient at this time.  All issues noted in this document were discussed and addressed.  A limited physical exam was performed with this format.  Please refer to the patient's chart for her consent to telehealth for [facility].   Date:  07/21/2019   ID:  JORI FRERICHS, DOB [birth year], MRN [id number]  Patient Location: Home Provider Location: Home  PCP:  Cari Caraway, MD  Cardiologist:  Sinclair Grooms, MD  Electrophysiologist:  None   Evaluation Performed:  Follow-Up Visit  Chief Complaint:  PAF  History of Present Illness:    Katherine Powell is a 85 y.o. female with paroxysmal atrial fibrillation, rhythm control with flecainide therapy, and chronic anticoagulation using Eliquis.  She feels okay. Difficulty connecting to video. Being careful in COVID 19.  The patient has had 1 significant episode of atrial fibrillation that lasted long enough for her to need an additional tablet of flecainide.  Subsequently everything has gone well.  She feels that was under a stressful situation when it occurred.  PAF in July but otherwise okay. Remarried a Water quality scientist.  The patient does not have symptoms concerning for COVID-19 infection (fever, chills, cough, or new shortness of breath).    Past Medical History:  Diagnosis Date  . Anxiety   . Arrhythmia    atrial fibrillation/  on   . Arthritis   . Atrial fibrillation ([place])   . Breast cancer ([place]) 11/09/13   right upper outer  . Contact lens/glasses fitting    contact rt eye  . Hx of radiation  therapy 12/15/13- 01/11/14   right breast 4250 cGy in 17 sessions, (hypo-fractionated), no boost  . Hypertension   . Postmenopausal   . Rosacea   . Seasonal allergies   . Sleep disturbance   . Thyroid disease   . Urinary incontinence    Past Surgical History:  Procedure Laterality Date  . ABDOMINAL HYSTERECTOMY  1973   endometriosis  . APPENDECTOMY  1954  . BACK SURGERY     lumb-2005  . BILATERAL OOPHORECTOMY     benign tumor on ovary  . BREAST LUMPECTOMY WITH NEEDLE LOCALIZATION Right 11/09/2013   Procedure: BREAST LUMPECTOMY WITH NEEDLE LOCALIZATION;  Surgeon: Harl Bowie, MD;  Location: [facility];  Service: General;  Laterality: Right;  . CATARACT EXTRACTION  2013   both  . CYSTOCELE REPAIR  2008  . EYE SURGERY     cataracts-plugs for eyes  . RECTOCELE REPAIR  2008  . TONSILLECTOMY       No outpatient medications have been marked as taking for the 07/21/19 encounter (Telemedicine) with Belva Crome, MD.     Allergies:   Codeine and Gabapentin   Social History   Tobacco Use  . Smoking status: Never Smoker  . Smokeless tobacco: Never Used  Substance Use Topics  . Alcohol use: No  . Drug use: No     Family Hx: The patient's family history includes Alzheimer's disease in her mother; Stroke in her father. There is no history of Heart attack.  ROS:  Please see the history of present illness.    No chest discomfort, orthopnea, or PND. All other systems reviewed and are negative.   Prior CV studies:   The following studies were reviewed today:  NONE  Labs/Other Tests and Data Reviewed:    EKG:  No ECG reviewed.  Recent Labs: 01/26/2019: ALT 13; BUN 20; Creatinine, Ser 1.04; Hemoglobin 11.6; Platelets 184; Potassium 3.9; Sodium 136   Recent Lipid Panel No results found for: CHOL, TRIG, HDL, CHOLHDL, LDLCALC, LDLDIRECT  Wt Readings from Last 3 Encounters:  07/21/19 155 lb (70.3 kg)  05/06/19 155 lb (70.3 kg)  02/13/19 156 lb 9.6 oz  (71 kg)     Objective:    Vital Signs:  BP 122/68   Pulse 61   Ht 5\' 7"  (1.702 m)   Wt 155 lb (70.3 kg)   BMI 24.28 kg/m    VITAL SIGNS:  reviewed  ASSESSMENT & PLAN:    1. Paroxysmal atrial fibrillation (HCC)   2. TIA (transient ischemic attack)   3. Chronic anticoagulation   4. Encounter for monitoring flecainide therapy   5. Educated About Covid-19 Virus Infection    PLAN:  1. Paroxysmal atrial fibrillation currently controlled on medical regimen 2. No transient neurological complaints 3. She continues anticoagulation therapy without bleeding complications 4. Tolerating flecainide without trouble 5. Social distancing, mask wearing, and handwashing is encouraged.  Clinical follow-up in 6 to 9 months.   COVID-19 Education: The signs and symptoms of COVID-19 were discussed with the patient and how to seek care for testing (follow up with PCP or arrange E-visit).  The importance of social distancing was discussed today.  Time:   Today, I have spent 15 minutes with the patient with telehealth technology discussing the above problems.     Medication Adjustments/Labs and Tests Ordered: Current medicines are reviewed at length with the patient today.  Concerns regarding medicines are outlined above.   Tests Ordered: No orders of the defined types were placed in this encounter.   Medication Changes: No orders of the defined types were placed in this encounter.   Follow Up:  In Person in 8 month(s)  Signed, Sinclair Grooms, MD  07/21/2019 5:12 PM    [place]

## 2019-07-20 ENCOUNTER — Other Ambulatory Visit: Payer: Self-pay | Admitting: *Deleted

## 2019-07-21 ENCOUNTER — Other Ambulatory Visit: Payer: Self-pay

## 2019-07-21 ENCOUNTER — Telehealth (INDEPENDENT_AMBULATORY_CARE_PROVIDER_SITE_OTHER): Payer: PPO | Admitting: Interventional Cardiology

## 2019-07-21 ENCOUNTER — Encounter: Payer: Self-pay | Admitting: Interventional Cardiology

## 2019-07-21 VITALS — BP 122/68 | HR 61 | Ht 67.0 in | Wt 155.0 lb

## 2019-07-21 DIAGNOSIS — Z7189 Other specified counseling: Secondary | ICD-10-CM

## 2019-07-21 DIAGNOSIS — I480 Paroxysmal atrial fibrillation: Secondary | ICD-10-CM

## 2019-07-21 DIAGNOSIS — G459 Transient cerebral ischemic attack, unspecified: Secondary | ICD-10-CM

## 2019-07-21 DIAGNOSIS — Z5181 Encounter for therapeutic drug level monitoring: Secondary | ICD-10-CM

## 2019-07-21 DIAGNOSIS — Z7901 Long term (current) use of anticoagulants: Secondary | ICD-10-CM

## 2019-07-21 NOTE — Patient Instructions (Signed)
Medication Instructions:  Your physician recommends that you continue on your current medications as directed. Please refer to the Current Medication list given to you today.  If you need a refill on your cardiac medications before your next appointment, please call your pharmacy.   Lab work: None If you have labs (blood work) drawn today and your tests are completely normal, you will receive your results only by: [name] MyChart Message (if you have MyChart) OR . A paper copy in the mail If you have any lab test that is abnormal or we need to change your treatment, we will call you to review the results.  Testing/Procedures: None  Follow-Up: At [facility], you and your health needs are our priority.  As part of our continuing mission to provide you with exceptional heart care, we have created designated Provider Care Teams.  These Care Teams include your primary Cardiologist (physician) and Advanced Practice Providers (APPs -  Physician Assistants and Nurse Practitioners) who all work together to provide you with the care you need, when you need it. You will need a follow up appointment in 6-8 months.  Please call our office 2 months in advance to schedule this appointment.  You may see Sinclair Grooms, MD or one of the following Advanced Practice Providers on your designated Care Team:   Truitt Merle, NP Cecilie Kicks, NP . Kathyrn Drown, NP  Any Other Special Instructions Will Be Listed Below (If Applicable).

## 2019-08-19 DIAGNOSIS — Z961 Presence of intraocular lens: Secondary | ICD-10-CM | POA: Diagnosis not present

## 2019-08-19 DIAGNOSIS — S0500XA Injury of conjunctiva and corneal abrasion without foreign body, unspecified eye, initial encounter: Secondary | ICD-10-CM | POA: Diagnosis not present

## 2019-08-19 DIAGNOSIS — H16001 Unspecified corneal ulcer, right eye: Secondary | ICD-10-CM | POA: Diagnosis not present

## 2019-08-20 DIAGNOSIS — S0500XD Injury of conjunctiva and corneal abrasion without foreign body, unspecified eye, subsequent encounter: Secondary | ICD-10-CM | POA: Diagnosis not present

## 2019-08-20 DIAGNOSIS — H16001 Unspecified corneal ulcer, right eye: Secondary | ICD-10-CM | POA: Diagnosis not present

## 2019-08-21 DIAGNOSIS — H16001 Unspecified corneal ulcer, right eye: Secondary | ICD-10-CM | POA: Diagnosis not present

## 2019-08-21 DIAGNOSIS — S0500XD Injury of conjunctiva and corneal abrasion without foreign body, unspecified eye, subsequent encounter: Secondary | ICD-10-CM | POA: Diagnosis not present

## 2019-08-22 DIAGNOSIS — H16223 Keratoconjunctivitis sicca, not specified as Sjogren's, bilateral: Secondary | ICD-10-CM | POA: Diagnosis not present

## 2019-08-22 DIAGNOSIS — H16001 Unspecified corneal ulcer, right eye: Secondary | ICD-10-CM | POA: Diagnosis not present

## 2019-08-22 DIAGNOSIS — H02042 Spastic entropion of right lower eyelid: Secondary | ICD-10-CM | POA: Diagnosis not present

## 2019-08-24 DIAGNOSIS — H0102B Squamous blepharitis left eye, upper and lower eyelids: Secondary | ICD-10-CM | POA: Diagnosis not present

## 2019-08-24 DIAGNOSIS — H0102A Squamous blepharitis right eye, upper and lower eyelids: Secondary | ICD-10-CM | POA: Diagnosis not present

## 2019-08-24 DIAGNOSIS — H20051 Hypopyon, right eye: Secondary | ICD-10-CM | POA: Diagnosis not present

## 2019-08-24 DIAGNOSIS — H16001 Unspecified corneal ulcer, right eye: Secondary | ICD-10-CM | POA: Diagnosis not present

## 2019-08-26 DIAGNOSIS — H16001 Unspecified corneal ulcer, right eye: Secondary | ICD-10-CM | POA: Diagnosis not present

## 2019-08-26 DIAGNOSIS — H20051 Hypopyon, right eye: Secondary | ICD-10-CM | POA: Diagnosis not present

## 2019-08-26 DIAGNOSIS — S0500XD Injury of conjunctiva and corneal abrasion without foreign body, unspecified eye, subsequent encounter: Secondary | ICD-10-CM | POA: Diagnosis not present

## 2019-08-26 DIAGNOSIS — H02042 Spastic entropion of right lower eyelid: Secondary | ICD-10-CM | POA: Diagnosis not present

## 2019-08-28 DIAGNOSIS — H16001 Unspecified corneal ulcer, right eye: Secondary | ICD-10-CM | POA: Diagnosis not present

## 2019-08-28 DIAGNOSIS — S0500XD Injury of conjunctiva and corneal abrasion without foreign body, unspecified eye, subsequent encounter: Secondary | ICD-10-CM | POA: Diagnosis not present

## 2019-08-28 DIAGNOSIS — H02042 Spastic entropion of right lower eyelid: Secondary | ICD-10-CM | POA: Diagnosis not present

## 2019-08-31 DIAGNOSIS — H16001 Unspecified corneal ulcer, right eye: Secondary | ICD-10-CM | POA: Diagnosis not present

## 2019-08-31 DIAGNOSIS — H26492 Other secondary cataract, left eye: Secondary | ICD-10-CM | POA: Diagnosis not present

## 2019-08-31 DIAGNOSIS — H02052 Trichiasis without entropian right lower eyelid: Secondary | ICD-10-CM | POA: Diagnosis not present

## 2019-08-31 DIAGNOSIS — S0500XD Injury of conjunctiva and corneal abrasion without foreign body, unspecified eye, subsequent encounter: Secondary | ICD-10-CM | POA: Diagnosis not present

## 2019-08-31 DIAGNOSIS — Z961 Presence of intraocular lens: Secondary | ICD-10-CM | POA: Diagnosis not present

## 2019-08-31 DIAGNOSIS — H02042 Spastic entropion of right lower eyelid: Secondary | ICD-10-CM | POA: Diagnosis not present

## 2019-08-31 DIAGNOSIS — H353131 Nonexudative age-related macular degeneration, bilateral, early dry stage: Secondary | ICD-10-CM | POA: Diagnosis not present

## 2019-08-31 DIAGNOSIS — H16223 Keratoconjunctivitis sicca, not specified as Sjogren's, bilateral: Secondary | ICD-10-CM | POA: Diagnosis not present

## 2019-08-31 DIAGNOSIS — H43813 Vitreous degeneration, bilateral: Secondary | ICD-10-CM | POA: Diagnosis not present

## 2019-08-31 DIAGNOSIS — H0102A Squamous blepharitis right eye, upper and lower eyelids: Secondary | ICD-10-CM | POA: Diagnosis not present

## 2019-08-31 DIAGNOSIS — H10021 Other mucopurulent conjunctivitis, right eye: Secondary | ICD-10-CM | POA: Diagnosis not present

## 2019-08-31 DIAGNOSIS — H0102B Squamous blepharitis left eye, upper and lower eyelids: Secondary | ICD-10-CM | POA: Diagnosis not present

## 2019-09-02 DIAGNOSIS — H168 Other keratitis: Secondary | ICD-10-CM | POA: Diagnosis not present

## 2019-09-04 DIAGNOSIS — H168 Other keratitis: Secondary | ICD-10-CM | POA: Diagnosis not present

## 2019-09-09 DIAGNOSIS — H02042 Spastic entropion of right lower eyelid: Secondary | ICD-10-CM | POA: Diagnosis not present

## 2019-09-09 DIAGNOSIS — H168 Other keratitis: Secondary | ICD-10-CM | POA: Diagnosis not present

## 2019-09-14 DIAGNOSIS — H168 Other keratitis: Secondary | ICD-10-CM | POA: Diagnosis not present

## 2019-09-15 DIAGNOSIS — H02003 Unspecified entropion of right eye, unspecified eyelid: Secondary | ICD-10-CM | POA: Diagnosis not present

## 2019-09-16 DIAGNOSIS — H02003 Unspecified entropion of right eye, unspecified eyelid: Secondary | ICD-10-CM | POA: Diagnosis not present

## 2019-09-16 DIAGNOSIS — H168 Other keratitis: Secondary | ICD-10-CM | POA: Diagnosis not present

## 2019-09-21 DIAGNOSIS — Z20828 Contact with and (suspected) exposure to other viral communicable diseases: Secondary | ICD-10-CM | POA: Diagnosis not present

## 2019-09-21 DIAGNOSIS — H02003 Unspecified entropion of right eye, unspecified eyelid: Secondary | ICD-10-CM | POA: Diagnosis not present

## 2019-09-21 DIAGNOSIS — H168 Other keratitis: Secondary | ICD-10-CM | POA: Diagnosis not present

## 2019-09-23 DIAGNOSIS — H02045 Spastic entropion of left lower eyelid: Secondary | ICD-10-CM | POA: Diagnosis not present

## 2019-09-23 DIAGNOSIS — I10 Essential (primary) hypertension: Secondary | ICD-10-CM | POA: Diagnosis not present

## 2019-09-23 DIAGNOSIS — Z79899 Other long term (current) drug therapy: Secondary | ICD-10-CM | POA: Diagnosis not present

## 2019-09-23 DIAGNOSIS — H02002 Unspecified entropion of right lower eyelid: Secondary | ICD-10-CM | POA: Diagnosis not present

## 2019-09-23 DIAGNOSIS — H02036 Senile entropion of left eye, unspecified eyelid: Secondary | ICD-10-CM | POA: Diagnosis not present

## 2019-09-23 DIAGNOSIS — Z7901 Long term (current) use of anticoagulants: Secondary | ICD-10-CM | POA: Diagnosis not present

## 2019-09-23 DIAGNOSIS — H02042 Spastic entropion of right lower eyelid: Secondary | ICD-10-CM | POA: Diagnosis not present

## 2019-09-23 DIAGNOSIS — H02033 Senile entropion of right eye, unspecified eyelid: Secondary | ICD-10-CM | POA: Diagnosis not present

## 2019-09-23 DIAGNOSIS — H02032 Senile entropion of right lower eyelid: Secondary | ICD-10-CM | POA: Diagnosis not present

## 2019-09-23 DIAGNOSIS — I480 Paroxysmal atrial fibrillation: Secondary | ICD-10-CM | POA: Diagnosis not present

## 2019-09-23 DIAGNOSIS — Z853 Personal history of malignant neoplasm of breast: Secondary | ICD-10-CM | POA: Diagnosis not present

## 2019-09-28 DIAGNOSIS — H168 Other keratitis: Secondary | ICD-10-CM | POA: Diagnosis not present

## 2019-10-05 DIAGNOSIS — H168 Other keratitis: Secondary | ICD-10-CM | POA: Diagnosis not present

## 2019-10-21 DIAGNOSIS — H168 Other keratitis: Secondary | ICD-10-CM | POA: Diagnosis not present

## 2019-10-21 DIAGNOSIS — H1830 Unspecified corneal membrane change: Secondary | ICD-10-CM | POA: Diagnosis not present

## 2019-10-27 ENCOUNTER — Other Ambulatory Visit: Payer: Self-pay | Admitting: Interventional Cardiology

## 2019-11-04 DIAGNOSIS — H1830 Unspecified corneal membrane change: Secondary | ICD-10-CM | POA: Diagnosis not present

## 2019-11-04 DIAGNOSIS — H168 Other keratitis: Secondary | ICD-10-CM | POA: Diagnosis not present

## 2019-11-16 DIAGNOSIS — H1830 Unspecified corneal membrane change: Secondary | ICD-10-CM | POA: Diagnosis not present

## 2019-12-08 DIAGNOSIS — H02052 Trichiasis without entropian right lower eyelid: Secondary | ICD-10-CM | POA: Diagnosis not present

## 2019-12-31 DIAGNOSIS — I480 Paroxysmal atrial fibrillation: Secondary | ICD-10-CM | POA: Diagnosis not present

## 2019-12-31 DIAGNOSIS — C50911 Malignant neoplasm of unspecified site of right female breast: Secondary | ICD-10-CM | POA: Diagnosis not present

## 2019-12-31 DIAGNOSIS — N183 Chronic kidney disease, stage 3 unspecified: Secondary | ICD-10-CM | POA: Diagnosis not present

## 2019-12-31 DIAGNOSIS — I10 Essential (primary) hypertension: Secondary | ICD-10-CM | POA: Diagnosis not present

## 2020-01-12 DIAGNOSIS — I10 Essential (primary) hypertension: Secondary | ICD-10-CM | POA: Diagnosis not present

## 2020-01-12 DIAGNOSIS — F419 Anxiety disorder, unspecified: Secondary | ICD-10-CM | POA: Diagnosis not present

## 2020-01-12 DIAGNOSIS — J309 Allergic rhinitis, unspecified: Secondary | ICD-10-CM | POA: Diagnosis not present

## 2020-01-12 DIAGNOSIS — Z0000 Encounter for general adult medical examination without abnormal findings: Secondary | ICD-10-CM | POA: Diagnosis not present

## 2020-01-12 DIAGNOSIS — M85851 Other specified disorders of bone density and structure, right thigh: Secondary | ICD-10-CM | POA: Diagnosis not present

## 2020-01-12 DIAGNOSIS — Z1389 Encounter for screening for other disorder: Secondary | ICD-10-CM | POA: Diagnosis not present

## 2020-01-12 DIAGNOSIS — H18821 Corneal disorder due to contact lens, right eye: Secondary | ICD-10-CM | POA: Diagnosis not present

## 2020-01-12 DIAGNOSIS — Z79899 Other long term (current) drug therapy: Secondary | ICD-10-CM | POA: Diagnosis not present

## 2020-01-12 DIAGNOSIS — I480 Paroxysmal atrial fibrillation: Secondary | ICD-10-CM | POA: Diagnosis not present

## 2020-01-12 DIAGNOSIS — Z7901 Long term (current) use of anticoagulants: Secondary | ICD-10-CM | POA: Diagnosis not present

## 2020-01-12 DIAGNOSIS — C50911 Malignant neoplasm of unspecified site of right female breast: Secondary | ICD-10-CM | POA: Diagnosis not present

## 2020-01-13 DIAGNOSIS — Z1231 Encounter for screening mammogram for malignant neoplasm of breast: Secondary | ICD-10-CM | POA: Diagnosis not present

## 2020-01-18 DIAGNOSIS — H179 Unspecified corneal scar and opacity: Secondary | ICD-10-CM | POA: Diagnosis not present

## 2020-01-18 DIAGNOSIS — H02052 Trichiasis without entropian right lower eyelid: Secondary | ICD-10-CM | POA: Diagnosis not present

## 2020-01-18 DIAGNOSIS — H1830 Unspecified corneal membrane change: Secondary | ICD-10-CM | POA: Diagnosis not present

## 2020-01-19 ENCOUNTER — Other Ambulatory Visit: Payer: Self-pay | Admitting: Interventional Cardiology

## 2020-01-19 MED ORDER — APIXABAN 5 MG PO TABS: 5.0000 mg | ORAL_TABLET | Freq: Two times a day (BID) | ORAL | 1 refills | Status: DC

## 2020-01-19 NOTE — Telephone Encounter (Signed)
*  STAT* If patient is at the pharmacy, call can be transferred to refill team.   1. Which medications need to be refilled? (please list name of each medication and dose if known) *need a new prescription for Eliquis 5 mg 2 times a day  2. Which pharmacy/location (including street and city if local pharmacy) is medication to be sent to? [facility] [id number] ID# [id number]  3. Do they need a 30 day or 90 day supply?180 and refills

## 2020-01-19 NOTE — Telephone Encounter (Signed)
Prescription refill request for Eliquis received.  Last office visit: 8/18/2020[name] Scr: 1.04, 01/26/2019 Age: 86 y.o. Weight: 70.3kg   Prescription refill sent.

## 2020-01-20 DIAGNOSIS — J309 Allergic rhinitis, unspecified: Secondary | ICD-10-CM | POA: Diagnosis not present

## 2020-01-20 DIAGNOSIS — J3089 Other allergic rhinitis: Secondary | ICD-10-CM | POA: Diagnosis not present

## 2020-01-20 DIAGNOSIS — R1313 Dysphagia, pharyngeal phase: Secondary | ICD-10-CM | POA: Diagnosis not present

## 2020-01-20 DIAGNOSIS — Z5181 Encounter for therapeutic drug level monitoring: Secondary | ICD-10-CM | POA: Diagnosis not present

## 2020-01-20 DIAGNOSIS — M85851 Other specified disorders of bone density and structure, right thigh: Secondary | ICD-10-CM | POA: Diagnosis not present

## 2020-01-20 DIAGNOSIS — Z79899 Other long term (current) drug therapy: Secondary | ICD-10-CM | POA: Diagnosis not present

## 2020-01-20 DIAGNOSIS — N3946 Mixed incontinence: Secondary | ICD-10-CM | POA: Diagnosis not present

## 2020-01-20 DIAGNOSIS — C50911 Malignant neoplasm of unspecified site of right female breast: Secondary | ICD-10-CM | POA: Diagnosis not present

## 2020-01-20 DIAGNOSIS — I10 Essential (primary) hypertension: Secondary | ICD-10-CM | POA: Diagnosis not present

## 2020-01-20 DIAGNOSIS — Z7901 Long term (current) use of anticoagulants: Secondary | ICD-10-CM | POA: Diagnosis not present

## 2020-01-20 DIAGNOSIS — N183 Chronic kidney disease, stage 3 unspecified: Secondary | ICD-10-CM | POA: Diagnosis not present

## 2020-01-20 DIAGNOSIS — I480 Paroxysmal atrial fibrillation: Secondary | ICD-10-CM | POA: Diagnosis not present

## 2020-01-31 NOTE — Progress Notes (Signed)
Cardiology Office Note:    Date:  02/01/2020   ID:  Katherine Powell, DOB [birth year], MRN [id number]  PCP:  Cari Caraway, MD  Cardiologist:  Sinclair Grooms, MD   Referring MD: Cari Caraway, MD   Chief Complaint  Patient presents with  . Atrial Fibrillation  . Advice Only    Anticoagulation    History of Present Illness:    Katherine Powell is a 86 y.o. female with a hx of paroxysmal atrial fibrillation, rhythm control with flecainide therapy, and chronic anticoagulation using Eliquis.  Katherine Powell has remarried.  She is very happy.  This is an old high school friend.  Both spouses are deceased.  Her marriage is going great.  She has rare recurrent episodes of atrial fibrillation.  She denies angina, orthopnea, PND, syncope, chest pain, and bleeding on apixaban.  Her husband also has atrial fibrillation and she is making the mistake of trying to compare her condition to his.  She is concerned that he is in atrial fibrillation all the time.  Past Medical History:  Diagnosis Date  . Anxiety   . Arrhythmia    atrial fibrillation/  on   . Arthritis   . Atrial fibrillation ([place])   . Breast cancer ([place]) 11/09/13   right upper outer  . Contact lens/glasses fitting    contact rt eye  . Hx of radiation therapy 12/15/13- 01/11/14   right breast 4250 cGy in 17 sessions, (hypo-fractionated), no boost  . Hypertension   . Postmenopausal   . Rosacea   . Seasonal allergies   . Sleep disturbance   . Thyroid disease   . Urinary incontinence     Past Surgical History:  Procedure Laterality Date  . ABDOMINAL HYSTERECTOMY  1973   endometriosis  . APPENDECTOMY  1954  . BACK SURGERY     lumb-2005  . BILATERAL OOPHORECTOMY     benign tumor on ovary  . BREAST LUMPECTOMY WITH NEEDLE LOCALIZATION Right 11/09/2013   Procedure: BREAST LUMPECTOMY WITH NEEDLE LOCALIZATION;  Surgeon: Harl Bowie, MD;  Location: [place];  Service: General;  Laterality: Right;  .  CATARACT EXTRACTION  2013   both  . CYSTOCELE REPAIR  2008  . EYE SURGERY     cataracts-plugs for eyes  . RECTOCELE REPAIR  2008  . TONSILLECTOMY      Current Medications: Current Meds  Medication Sig  . acetaminophen (TYLENOL) 650 MG CR tablet Take 650 mg by mouth every 8 (eight) hours as needed for pain. Reported on 01/19/2016  . ALPRAZolam (XANAX) 0.5 MG tablet Take 1/2 to 1 tablet by mouth twice a day as needed  . apixaban (ELIQUIS) 5 MG TABS tablet Take 1 tablet (5 mg total) by mouth 2 (two) times daily.  . Calcium-Phosphorus-Vitamin D (CITRACAL +D3 PO) Take 1 tablet by mouth daily.  [name] CARTIA XT 120 MG 24 hr capsule Take 120 mg by mouth every evening.   . cholecalciferol (VITAMIN D) 400 UNITS TABS tablet Take 400 Units by mouth 2 (two) times daily.   [name] erythromycin ophthalmic ointment At bedtime  . fexofenadine (ALLEGRA) 180 MG tablet Take 180 mg by mouth daily as needed.  . flecainide (TAMBOCOR) 50 MG tablet TAKE HALF TO ONE TABLET BY MOUTH TWICE DAILY   . fluticasone (FLONASE) 50 MCG/ACT nasal spray Place 1 spray into both nostrils daily as needed for allergies. Reported on 01/19/2016  . glucosamine-chondroitin 500-400 MG tablet Take 1 tablet by mouth 2 (two)  times daily.  [name] IRON-VITAMIN C PO Take 1 tablet by mouth at bedtime.  . Multiple Vitamins-Minerals (PRESERVISION AREDS 2+MULTI VIT PO) Take 1 capsule by mouth 2 (two) times daily.  . Omega-3 Fatty Acids (EQL OMEGA 3 FISH OIL) 1400 MG CAPS Take 1 capsule by mouth daily.  . prednisoLONE acetate (PRED FORTE) 1 % ophthalmic suspension Apply 1 drop to eye 2 (two) times daily.  . sodium chloride (OCEAN) 0.65 % nasal spray Place 1 spray into the nose as needed for congestion. Reported on 01/19/2016  . trimethoprim-polymyxin b (POLYTRIM) ophthalmic solution      Allergies:   Codeine and Gabapentin   Social History   Socioeconomic History  . Marital status: Married    Spouse name: Not on file  . Number of children: Not on file    . Years of education: Not on file  . Highest education level: Not on file  Occupational History  . Not on file  Tobacco Use  . Smoking status: Never Smoker  . Smokeless tobacco: Never Used  Substance and Sexual Activity  . Alcohol use: No  . Drug use: No  . Sexual activity: Never    Comment: menarche age 31, P48, first live birth age 23,  HRT x 30 years  Other Topics Concern  . Not on file  Social History Narrative  . Not on file   Social Determinants of Health   Financial Resource Strain:   . Difficulty of Paying Living Expenses: Not on file  Food Insecurity:   . Worried About Charity fundraiser in the Last Year: Not on file  . Ran Out of Food in the Last Year: Not on file  Transportation Needs:   . Lack of Transportation (Medical): Not on file  . Lack of Transportation (Non-Medical): Not on file  Physical Activity:   . Days of Exercise per Week: Not on file  . Minutes of Exercise per Session: Not on file  Stress:   . Feeling of Stress : Not on file  Social Connections:   . Frequency of Communication with Friends and Family: Not on file  . Frequency of Social Gatherings with Friends and Family: Not on file  . Attends Religious Services: Not on file  . Active Member of Clubs or Organizations: Not on file  . Attends Archivist Meetings: Not on file  . Marital Status: Not on file     Family History: The patient's family history includes Alzheimer's disease in her mother; Stroke in her father. There is no history of Heart attack.  ROS:   Please see the history of present illness.    She is exercising with yoga and Pilates 5 days a week.  She denies neurological complaints.  No blood in the urine or stool.  All other systems reviewed and are negative.  EKGs/Labs/Other Studies Reviewed:    The following studies were reviewed today: No new data  EKG:  EKG sinus rhythm poor R wave progression with incomplete right bundle.  Otherwise normal.  Recent  Labs: No results found for requested labs within last 8760 hours.  Recent Lipid Panel No results found for: CHOL, TRIG, HDL, CHOLHDL, VLDL, LDLCALC, LDLDIRECT  Physical Exam:    VS:  BP (!) 142/82   Pulse 64   Ht 5\' 7"  (1.702 m)   Wt 150 lb 12.8 oz (68.4 kg)   SpO2 97%   BMI 23.62 kg/m     Wt Readings from Last 3 Encounters:  02/01/20  150 lb 12.8 oz (68.4 kg)  07/21/19 155 lb (70.3 kg)  05/06/19 155 lb (70.3 kg)     GEN: Peers younger than stated age. No acute distress HEENT: Normal NECK: No JVD. LYMPHATICS: No lymphadenopathy CARDIAC:  RRR without murmur, gallop, or edema. VASCULAR:  Normal Pulses. No bruits. RESPIRATORY:  Clear to auscultation without rales, wheezing or rhonchi  ABDOMEN: Soft, non-tender, non-distended, No pulsatile mass, MUSCULOSKELETAL: No deformity  SKIN: Warm and dry NEUROLOGIC:  Alert and oriented x 3 PSYCHIATRIC:  Normal affect   ASSESSMENT:    1. Paroxysmal atrial fibrillation (HCC)   2. Chronic anticoagulation   3. TIA (transient ischemic attack)   4. Encounter for monitoring flecainide therapy   5. Educated about COVID-19 virus infection    PLAN:    In order of problems listed above:  1. Normal sinus rhythm based upon today's exam and EKG.  Continue flecainide 50 mg twice daily.  She has the freedom to take an extra flecainide for recurrent atrial fibrillation.  When episodes occur they usually resolve within 12 hours.  She is notified to call if persistent atrial fibrillation and is warned not to take multiple doses of flecainide on her own without checking in with [place]. 2. Continue the current dose of apixaban. 3. No recent neurological complaints. 4. EKG does not demonstrate any QRS widening. 5. She has received the COVID-19 vaccine as has her husband.  3W's is being practiced.     Medication Adjustments/Labs and Tests Ordered: Current medicines are reviewed at length with the patient today.  Concerns regarding medicines are  outlined above.  Orders Placed This Encounter  Procedures  . EKG 12-Lead   No orders of the defined types were placed in this encounter.   There are no Patient Instructions on file for this visit.   Signed, Sinclair Grooms, MD  02/01/2020 9:19 AM    [place]

## 2020-02-01 ENCOUNTER — Ambulatory Visit (INDEPENDENT_AMBULATORY_CARE_PROVIDER_SITE_OTHER): Payer: PPO | Admitting: Interventional Cardiology

## 2020-02-01 ENCOUNTER — Encounter: Payer: Self-pay | Admitting: Interventional Cardiology

## 2020-02-01 ENCOUNTER — Other Ambulatory Visit: Payer: Self-pay

## 2020-02-01 VITALS — BP 142/82 | HR 64 | Ht 67.0 in | Wt 150.8 lb

## 2020-02-01 DIAGNOSIS — Z7189 Other specified counseling: Secondary | ICD-10-CM | POA: Diagnosis not present

## 2020-02-01 DIAGNOSIS — G459 Transient cerebral ischemic attack, unspecified: Secondary | ICD-10-CM

## 2020-02-01 DIAGNOSIS — Z79899 Other long term (current) drug therapy: Secondary | ICD-10-CM

## 2020-02-01 DIAGNOSIS — Z5181 Encounter for therapeutic drug level monitoring: Secondary | ICD-10-CM

## 2020-02-01 DIAGNOSIS — Z7901 Long term (current) use of anticoagulants: Secondary | ICD-10-CM | POA: Diagnosis not present

## 2020-02-01 DIAGNOSIS — I480 Paroxysmal atrial fibrillation: Secondary | ICD-10-CM

## 2020-02-01 MED ORDER — DILTIAZEM HCL ER COATED BEADS 120 MG PO CP24: 120.0000 mg | ORAL_CAPSULE | Freq: Every evening | ORAL | 3 refills | Status: DC

## 2020-02-01 NOTE — Patient Instructions (Signed)

## 2020-02-02 ENCOUNTER — Telehealth: Payer: Self-pay | Admitting: Interventional Cardiology

## 2020-02-02 NOTE — Telephone Encounter (Signed)
Medical records requested from [facility] at [facility]. 02/02/20 vlm

## 2020-02-08 ENCOUNTER — Other Ambulatory Visit: Payer: Self-pay | Admitting: Interventional Cardiology

## 2020-02-08 MED ORDER — DILTIAZEM HCL ER COATED BEADS 120 MG PO CP24: 120.0000 mg | ORAL_CAPSULE | Freq: Every evening | ORAL | 3 refills | Status: DC

## 2020-02-08 MED ORDER — APIXABAN 5 MG PO TABS: 5.0000 mg | ORAL_TABLET | Freq: Two times a day (BID) | ORAL | 1 refills | Status: DC

## 2020-02-08 MED ORDER — FLECAINIDE ACETATE 50 MG PO TABS: ORAL_TABLET | ORAL | 0 refills | Status: DC

## 2020-02-08 NOTE — Telephone Encounter (Signed)
°*  STAT* If patient is at the pharmacy, call can be transferred to refill team.   1. Which medications need to be refilled? (please list name of each medication and dose if known) apixaban (ELIQUIS) 5 MG TABS tablet / diltiazem (CARTIA XT) 120 MG 24 hr capsule / flecainide (TAMBOCOR) 50 MG tablet  2. Which pharmacy/location (including street and city if local pharmacy) is medication to be sent to? [place]  3. Do they need a 30 day or 90 day supply? [place]

## 2020-02-08 NOTE — Telephone Encounter (Addendum)
Prescription refill request for Eliquis received.  Last office visit: Katherine Powell 02/01/2020 Age: 86 y.o. Weight: 68.4 kg Scr: 1.08, 01/20/2020  Prescription refill sent.

## 2020-02-18 DIAGNOSIS — H02052 Trichiasis without entropian right lower eyelid: Secondary | ICD-10-CM | POA: Diagnosis not present

## 2020-02-27 DIAGNOSIS — H179 Unspecified corneal scar and opacity: Secondary | ICD-10-CM | POA: Diagnosis not present

## 2020-02-27 DIAGNOSIS — Z20822 Contact with and (suspected) exposure to covid-19: Secondary | ICD-10-CM | POA: Diagnosis not present

## 2020-03-01 DIAGNOSIS — H1789 Other corneal scars and opacities: Secondary | ICD-10-CM | POA: Diagnosis not present

## 2020-03-01 DIAGNOSIS — H179 Unspecified corneal scar and opacity: Secondary | ICD-10-CM | POA: Diagnosis not present

## 2020-03-07 ENCOUNTER — Other Ambulatory Visit: Payer: Self-pay

## 2020-03-07 MED ORDER — APIXABAN 5 MG PO TABS: 5.0000 mg | ORAL_TABLET | Freq: Two times a day (BID) | ORAL | 2 refills | Status: DC

## 2020-03-07 NOTE — Telephone Encounter (Signed)
Eliquis 5mg  refill request received. Pt is 86 years old, weight-68.4kg, Crea-1.08 on 01/20/2020 via [facility] at [facility] PCP, Diagnosis-Afib, and last seen by Dr. Tamala Julian on 02/01/2020. Dose is appropriate based on dosing criteria. Will send in refill to requested pharmacy.

## 2020-03-18 DIAGNOSIS — I480 Paroxysmal atrial fibrillation: Secondary | ICD-10-CM | POA: Diagnosis not present

## 2020-03-18 DIAGNOSIS — I10 Essential (primary) hypertension: Secondary | ICD-10-CM | POA: Diagnosis not present

## 2020-03-18 DIAGNOSIS — N183 Chronic kidney disease, stage 3 unspecified: Secondary | ICD-10-CM | POA: Diagnosis not present

## 2020-03-18 DIAGNOSIS — C50911 Malignant neoplasm of unspecified site of right female breast: Secondary | ICD-10-CM | POA: Diagnosis not present

## 2020-03-23 ENCOUNTER — Other Ambulatory Visit: Payer: Self-pay | Admitting: Oncology

## 2020-03-23 DIAGNOSIS — C50411 Malignant neoplasm of upper-outer quadrant of right female breast: Secondary | ICD-10-CM

## 2020-03-23 DIAGNOSIS — Z170 Estrogen receptor positive status [ER+]: Secondary | ICD-10-CM

## 2020-04-04 DIAGNOSIS — M85851 Other specified disorders of bone density and structure, right thigh: Secondary | ICD-10-CM | POA: Diagnosis not present

## 2020-04-04 DIAGNOSIS — M85852 Other specified disorders of bone density and structure, left thigh: Secondary | ICD-10-CM | POA: Diagnosis not present

## 2020-04-13 DIAGNOSIS — L821 Other seborrheic keratosis: Secondary | ICD-10-CM | POA: Diagnosis not present

## 2020-04-13 DIAGNOSIS — L570 Actinic keratosis: Secondary | ICD-10-CM | POA: Diagnosis not present

## 2020-04-13 DIAGNOSIS — D1721 Benign lipomatous neoplasm of skin and subcutaneous tissue of right arm: Secondary | ICD-10-CM | POA: Diagnosis not present

## 2020-04-13 DIAGNOSIS — C44319 Basal cell carcinoma of skin of other parts of face: Secondary | ICD-10-CM | POA: Diagnosis not present

## 2020-04-13 DIAGNOSIS — D485 Neoplasm of uncertain behavior of skin: Secondary | ICD-10-CM | POA: Diagnosis not present

## 2020-04-25 ENCOUNTER — Other Ambulatory Visit: Payer: Self-pay

## 2020-04-25 MED ORDER — FLECAINIDE ACETATE 50 MG PO TABS: ORAL_TABLET | ORAL | 2 refills | Status: DC

## 2020-05-23 DIAGNOSIS — C44319 Basal cell carcinoma of skin of other parts of face: Secondary | ICD-10-CM | POA: Diagnosis not present

## 2020-05-23 DIAGNOSIS — Z85828 Personal history of other malignant neoplasm of skin: Secondary | ICD-10-CM | POA: Diagnosis not present

## 2020-06-15 NOTE — Progress Notes (Signed)
Cardiology Office Note    Date:  06/21/2020   ID:  Katherine, Powell 09/11/1932, MRN [id number]  PCP:  Cari Caraway, MD  Cardiologist: Sinclair Grooms, MD EPS: None  No chief complaint on file.   History of Present Illness:  Katherine Powell is a 86 y.o. female with history of PAF on flecainide and Eliquis  LOV Dr. Tamala Julian 02/2020 doing well. Can take extra flecainide as needed for breakthrough Afib.  Patient comes in for f/u. She had 2 episodes of Afib in May and 1 in June converted with 2 flecainide. Lasted about 12 hrs. They are occurring more frequently and lasting longer. BP up today-has had a busy morning. She has a lot of anxiety and thinks she should probably be on xanax regularly. She married her high school sweetheart last year. Does yoga 3-4 days/week. No chest pain or dyspnea  Past Medical History:  Diagnosis Date  . Anxiety   . Arrhythmia    atrial fibrillation/  on   . Arthritis   . Atrial fibrillation ([place])   . Breast cancer ([place]) 11/09/13   right upper outer  . Contact lens/glasses fitting    contact rt eye  . Hx of radiation therapy 12/15/13- 01/11/14   right breast 4250 cGy in 17 sessions, (hypo-fractionated), no boost  . Hypertension   . Postmenopausal   . Rosacea   . Seasonal allergies   . Sleep disturbance   . Thyroid disease   . Urinary incontinence     Past Surgical History:  Procedure Laterality Date  . ABDOMINAL HYSTERECTOMY  1973   endometriosis  . APPENDECTOMY  1954  . BACK SURGERY     lumb-2005  . BILATERAL OOPHORECTOMY     benign tumor on ovary  . BREAST LUMPECTOMY WITH NEEDLE LOCALIZATION Right 11/09/2013   Procedure: BREAST LUMPECTOMY WITH NEEDLE LOCALIZATION;  Surgeon: Harl Bowie, MD;  Location: [facility];  Service: General;  Laterality: Right;  . CATARACT EXTRACTION  2013   both  . CYSTOCELE REPAIR  2008  . EYE SURGERY     cataracts-plugs for eyes  . RECTOCELE REPAIR  2008  . TONSILLECTOMY       Current Medications: Current Meds  Medication Sig  . acetaminophen (TYLENOL) 650 MG CR tablet Take 650 mg by mouth every 8 (eight) hours as needed for pain. Reported on 01/19/2016  . ALPRAZolam (XANAX) 0.5 MG tablet Take 1/2 to 1 tablet by mouth twice a day as needed  . apixaban (ELIQUIS) 5 MG TABS tablet Take 1 tablet (5 mg total) by mouth 2 (two) times daily.  . cholecalciferol (VITAMIN D) 400 UNITS TABS tablet Take 400 Units by mouth 2 (two) times daily.   [name] diltiazem (CARTIA XT) 120 MG 24 hr capsule Take 1 capsule (120 mg total) by mouth every evening.  . fluticasone (FLONASE) 50 MCG/ACT nasal spray Place 1 spray into both nostrils daily as needed for allergies. Reported on 01/19/2016  . glucosamine-chondroitin 500-400 MG tablet Take 1 tablet by mouth 2 (two) times daily.  [name] IRON-VITAMIN C PO Take 1 tablet by mouth at bedtime.  . Omega-3 Fatty Acids (EQL OMEGA 3 FISH OIL) 1400 MG CAPS Take 1 capsule by mouth daily.  . prednisoLONE acetate (PRED FORTE) 1 % ophthalmic suspension Apply 1 drop to eye 2 (two) times daily.  . sodium chloride (OCEAN) 0.65 % nasal spray Place 1 spray into the nose as needed for congestion. Reported on 01/19/2016  .  trimethoprim-polymyxin b (POLYTRIM) ophthalmic solution   . [DISCONTINUED] flecainide (TAMBOCOR) 50 MG tablet TAKE HALF TO ONE TABLET BY MOUTH TWICE DAILY     Allergies:   Codeine and Gabapentin   Social History   Socioeconomic History  . Marital status: Married    Spouse name: Not on file  . Number of children: Not on file  . Years of education: Not on file  . Highest education level: Not on file  Occupational History  . Not on file  Tobacco Use  . Smoking status: Never Smoker  . Smokeless tobacco: Never Used  Vaping Use  . Vaping Use: Never used  Substance and Sexual Activity  . Alcohol use: No  . Drug use: No  . Sexual activity: Never    Comment: menarche age 24, P81, first live birth age 84,  HRT x 30 years  Other Topics Concern  .  Not on file  Social History Narrative  . Not on file   Social Determinants of Health   Financial Resource Strain:   . Difficulty of Paying Living Expenses:   Food Insecurity:   . Worried About Charity fundraiser in the Last Year:   . Arboriculturist in the Last Year:   Transportation Needs:   . Film/video editor (Medical):   [name] Lack of Transportation (Non-Medical):   Physical Activity:   . Days of Exercise per Week:   . Minutes of Exercise per Session:   Stress:   . Feeling of Stress :   Social Connections:   . Frequency of Communication with Friends and Family:   . Frequency of Social Gatherings with Friends and Family:   . Attends Religious Services:   . Active Member of Clubs or Organizations:   . Attends Archivist Meetings:   [name] Marital Status:      Family History:  The patient's family history includes Alzheimer's disease in her mother; Stroke in her father.   ROS:   Please see the history of present illness.    ROS All other systems reviewed and are negative.   PHYSICAL EXAM:   VS:  BP (!) 150/70   Pulse (!) 59   Ht _0  (1.676 m)   Wt 156 lb (70.8 kg)   SpO2 98%   BMI 25.18 kg/m   Physical Exam  GEN: Well nourished, well developed, in no acute distress  Neck: no JVD, carotid bruits, or masses Cardiac:RRR; no murmurs, rubs, or gallops  Respiratory:  clear to auscultation bilaterally, normal work of breathing GI: soft, nontender, nondistended, + BS Ext: without cyanosis, clubbing, or edema, Good distal pulses bilaterally Neuro:  Alert and Oriented x 3 Psych: euthymic mood, full affect  Wt Readings from Last 3 Encounters:  06/21/20 156 lb (70.8 kg)  02/01/20 150 lb 12.8 oz (68.4 kg)  07/21/19 155 lb (70.3 kg)      Studies/Labs Reviewed:   EKG:  EKG is not ordered today.    Recent Labs: No results found for requested labs within last 8760 hours.   Lipid Panel No results found for: CHOL, TRIG, HDL, CHOLHDL, VLDL, LDLCALC, LDLDIRECT   Additional studies/ records that were reviewed today include:  Echo 05/2018 Study Conclusions   - Left ventricle: The cavity size was normal. There was mild focal    basal hypertrophy of the septum. Systolic function was normal.    The estimated ejection fraction was in the range of 60% to 65%.    Wall motion  was normal; there were no regional wall motion    abnormalities. Features are consistent with a pseudonormal left    ventricular filling pattern, with concomitant abnormal relaxation    and increased filling pressure (grade 2 diastolic dysfunction).  - Aortic valve: There was mild regurgitation.  - Mitral valve: Systolic bowing without prolapse.  - Left atrium: The atrium was mildly dilated.      ASSESSMENT:    1. Paroxysmal atrial fibrillation (HCC)   2. Encounter for monitoring flecainide therapy   3. Essential hypertension      PLAN:  In order of problems listed above:  PAF on Flecainide diltiazem and Eliquis, now having a lot of breakthrough A. fib that is lasting longer and becoming more frequent.  Discussed with Dr. Tamala Julian who agrees we should increase flecainide to 100 mg twice daily but also need to rule out underlying CAD with a GXT Myoview.  No bleeding problems on Eliquis.  Check CBC and be met  Hypertension blood pressure up today but she has been running around all morning.  We will continue to monitor.  Watches her salt intake.    Medication Adjustments/Labs and Tests Ordered: Current medicines are reviewed at length with the patient today.  Concerns regarding medicines are outlined above.  Medication changes, Labs and Tests ordered today are listed in the Patient Instructions below. There are no Patient Instructions on file for this visit.   Sumner Boast, PA-C  06/21/2020 11:13 AM    [facility] [address], [place], [place]  [address] Phone: [phone]; Fax: [phone]

## 2020-06-21 ENCOUNTER — Other Ambulatory Visit: Payer: PPO

## 2020-06-21 ENCOUNTER — Ambulatory Visit (INDEPENDENT_AMBULATORY_CARE_PROVIDER_SITE_OTHER): Payer: PPO | Admitting: Physician Assistant

## 2020-06-21 ENCOUNTER — Encounter: Payer: Self-pay | Admitting: Physician Assistant

## 2020-06-21 ENCOUNTER — Other Ambulatory Visit: Payer: Self-pay

## 2020-06-21 VITALS — BP 150/70 | HR 59 | Ht 66.0 in | Wt 156.0 lb

## 2020-06-21 DIAGNOSIS — Z79899 Other long term (current) drug therapy: Secondary | ICD-10-CM

## 2020-06-21 DIAGNOSIS — I10 Essential (primary) hypertension: Secondary | ICD-10-CM

## 2020-06-21 DIAGNOSIS — I480 Paroxysmal atrial fibrillation: Secondary | ICD-10-CM

## 2020-06-21 DIAGNOSIS — Z5181 Encounter for therapeutic drug level monitoring: Secondary | ICD-10-CM | POA: Diagnosis not present

## 2020-06-21 MED ORDER — FLECAINIDE ACETATE 100 MG PO TABS
100.0000 mg | ORAL_TABLET | Freq: Two times a day (BID) | ORAL | 3 refills | Status: DC
Start: 2020-06-21 — End: 2020-12-16

## 2020-06-21 NOTE — Patient Instructions (Signed)
Medication Instructions:  Your physician has recommended you make the following change in your medication:   INCREASE Flecainide to 100mg  twice daily  *If you need a refill on your cardiac medications before your next appointment, please call your pharmacy*   Lab Work: BMET, CBC Today  If you have labs (blood work) drawn today and your tests are completely normal, you will receive your results only by: [name] MyChart Message (if you have MyChart) OR . A paper copy in the mail If you have any lab test that is abnormal or we need to change your treatment, we will call you to review the results.   Testing/Procedures: Your physician has requested that you have en exercise stress myoview. For further information please visit [email or web address]. Please follow instruction sheet, as given.    Follow-Up: At [facility], you and your health needs are our priority.  As part of our continuing mission to provide you with exceptional heart care, we have created designated Provider Care Teams.  These Care Teams include your primary Cardiologist (physician) and Advanced Practice Providers (APPs -  Physician Assistants and Nurse Practitioners) who all work together to provide you with the care you need, when you need it.  We recommend signing up for the patient portal called "MyChart".  Sign up information is provided on this After Visit Summary.  MyChart is used to connect with patients for Virtual Visits (Telemedicine).  Patients are able to view lab/test results, encounter notes, upcoming appointments, etc.  Non-urgent messages can be sent to your provider as well.   To learn more about what you can do with MyChart, go to [email or web address].    Your next appointment:   After Stress Test  The format for your next appointment:   In Person  Provider:   Daneen Schick, MD or Ermalinda Barrios, PA   Other Instructions None

## 2020-06-22 LAB — BASIC METABOLIC PANEL
BUN/Creatinine Ratio: 19 (ref 12–28)
BUN: 20 mg/dL (ref 8–27)
CO2: 25 mmol/L (ref 20–29)
Calcium: 9.5 mg/dL (ref 8.7–10.3)
Chloride: 101 mmol/L (ref 96–106)
Creatinine, Ser: 1.07 mg/dL — ABNORMAL HIGH (ref 0.57–1.00)
GFR calc Af Amer: 55 mL/min/{1.73_m2} — ABNORMAL LOW (ref >59)
GFR calc non Af Amer: 47 mL/min/{1.73_m2} — ABNORMAL LOW (ref >59)
Glucose: 159 mg/dL — ABNORMAL HIGH (ref 65–99)
Potassium: 4.2 mmol/L (ref 3.5–5.2)
Sodium: 138 mmol/L (ref 134–144)

## 2020-06-22 LAB — CBC
Hematocrit: 36.2 % (ref 34.0–46.6)
Hemoglobin: 12.2 g/dL (ref 11.1–15.9)
MCH: 32.2 pg (ref 26.6–33.0)
MCHC: 33.7 g/dL (ref 31.5–35.7)
MCV: 96 fL (ref 79–97)
Platelets: 212 10*3/uL (ref 150–450)
RBC: 3.79 x10E6/uL (ref 3.77–5.28)
RDW: 12.8 % (ref 11.7–15.4)
WBC: 5.5 10*3/uL (ref 3.4–10.8)

## 2020-07-05 ENCOUNTER — Telehealth (HOSPITAL_COMMUNITY): Payer: Self-pay | Admitting: *Deleted

## 2020-07-05 NOTE — Telephone Encounter (Signed)
Patient given detailed instructions per Myocardial Perfusion Study Information Sheet for the test on 07/08/20 at 7:45. Patient notified to arrive 15 minutes early and that it is imperative to arrive on time for appointment to keep from having the test rescheduled.  If you need to cancel or reschedule your appointment, please call the office within 24 hours of your appointment. . Patient verbalized understanding.Katherine Powell

## 2020-07-06 ENCOUNTER — Encounter (HOSPITAL_COMMUNITY): Payer: PPO

## 2020-07-06 NOTE — Progress Notes (Signed)
Virtual Visit via Video Note   This visit type was conducted due to national recommendations for restrictions regarding the COVID-19 Pandemic (e.g. social distancing) in an effort to limit this patient's exposure and mitigate transmission in our community.  Due to her co-morbid illnesses, this patient is at least at moderate risk for complications without adequate follow up.  This format is felt to be most appropriate for this patient at this time.  All issues noted in this document were discussed and addressed.  A limited physical exam was performed with this format.  Please refer to the patient's chart for her consent to telehealth for [facility].       Date:  07/12/2020   ID:  Katherine Powell, DOB Oct 18, 1932, MRN [id number] The patient was identified using 2 identifiers.  Patient Location: Home Provider Location: Office/Clinic  PCP:  Cari Caraway, MD  Cardiologist:  Sinclair Grooms, MD  Electrophysiologist:  None   Evaluation Performed:  Follow-Up Visit  Chief Complaint:  Follow up  History of Present Illness:    Katherine Powell is a 86 y.o. female with  history of PAF on flecainide and Eliquis   LOV Dr. Tamala Julian 02/2020 doing well. Can take extra flecainide as needed for breakthrough Afib.   I saw the patient 06/21/20 and having more frequent episodes of Afib. I spoke with Dr. Tamala Julian who recommended increasing flecainide 100 mg bid with f/u GXT myoview 07/08/20.  Patient says higher dose flecainide makes her sleepy but she is getting used to it. No further palpitations    The patient does not have symptoms concerning for COVID-19 infection (fever, chills, cough, or new shortness of breath).    Past Medical History:  Diagnosis Date  . Anxiety   . Arrhythmia    atrial fibrillation/  on   . Arthritis   . Atrial fibrillation ([place])   . Breast cancer ([place]) 11/09/13   right upper outer  . Contact lens/glasses fitting    contact rt eye  . Hx of radiation therapy  12/15/13- 01/11/14   right breast 4250 cGy in 17 sessions, (hypo-fractionated), no boost  . Hypertension   . Postmenopausal   . Rosacea   . Seasonal allergies   . Sleep disturbance   . Thyroid disease   . Urinary incontinence    Past Surgical History:  Procedure Laterality Date  . ABDOMINAL HYSTERECTOMY  1973   endometriosis  . APPENDECTOMY  1954  . BACK SURGERY     lumb-2005  . BILATERAL OOPHORECTOMY     benign tumor on ovary  . BREAST LUMPECTOMY WITH NEEDLE LOCALIZATION Right 11/09/2013   Procedure: BREAST LUMPECTOMY WITH NEEDLE LOCALIZATION;  Surgeon: Harl Bowie, MD;  Location: [place] [facility];  Service: General;  Laterality: Right;  . CATARACT EXTRACTION  2013   both  . CYSTOCELE REPAIR  2008  . EYE SURGERY     cataracts-plugs for eyes  . RECTOCELE REPAIR  2008  . TONSILLECTOMY       Current Meds  Medication Sig  . acetaminophen (TYLENOL) 650 MG CR tablet Take 650 mg by mouth every 8 (eight) hours as needed for pain. Reported on 01/19/2016  . ALPRAZolam (XANAX) 0.5 MG tablet Take 1/2 to 1 tablet by mouth twice a day as needed  . apixaban (ELIQUIS) 5 MG TABS tablet Take 1 tablet (5 mg total) by mouth 2 (two) times daily.  . cholecalciferol (VITAMIN D) 400 UNITS TABS tablet Take 400 Units by mouth  2 (two) times daily.   [name] diltiazem (CARTIA XT) 120 MG 24 hr capsule Take 1 capsule (120 mg total) by mouth every evening.  . flecainide (TAMBOCOR) 100 MG tablet Take 1 tablet (100 mg total) by mouth 2 (two) times daily.  . fluticasone (FLONASE) 50 MCG/ACT nasal spray Place 1 spray into both nostrils daily as needed for allergies. Reported on 01/19/2016  . glucosamine-chondroitin 500-400 MG tablet Take 1 tablet by mouth 2 (two) times daily.  [name] IRON-VITAMIN C PO Take 1 tablet by mouth at bedtime.  . Omega-3 Fatty Acids (EQL OMEGA 3 FISH OIL) 1400 MG CAPS Take 1 capsule by mouth daily.  . prednisoLONE acetate (PRED FORTE) 1 % ophthalmic suspension Apply 1 drop to eye 2  (two) times daily.  . sodium chloride (OCEAN) 0.65 % nasal spray Place 1 spray into the nose as needed for congestion. Reported on 01/19/2016  . trimethoprim-polymyxin b (POLYTRIM) ophthalmic solution      Allergies:   Codeine and Gabapentin   Social History   Tobacco Use  . Smoking status: Never Smoker  . Smokeless tobacco: Never Used  Vaping Use  . Vaping Use: Never used  Substance Use Topics  . Alcohol use: No  . Drug use: No     Family Hx: The patient's family history includes Alzheimer's disease in her mother; Stroke in her father. There is no history of Heart attack.  ROS:   Please see the history of present illness.      All other systems reviewed and are negative.   Prior CV studies:   The following studies were reviewed today:  NST 07/08/20: Study Highlights   Nuclear stress EF: 74%. The left ventricular ejection fraction is hyperdynamic (>65%).  The patient walked for 7 minutes and 30 seconds of a standard Bruce protocol treadmill test. She achieved a peak heart rate of 115 which is 85% predicted maximal heart rate.  At peak exercise there were no ST or T wave changes.  This is a low risk study. There is no evidence of ischemia and no evidence of previous infarction.  The study is normal.     Labs/Other Tests and Data Reviewed:    EKG:  No ECG reviewed.  Recent Labs: 06/21/2020: BUN 20; Creatinine, Ser 1.07; Hemoglobin 12.2; Platelets 212; Potassium 4.2; Sodium 138   Recent Lipid Panel No results found for: CHOL, TRIG, HDL, CHOLHDL, LDLCALC, LDLDIRECT  Wt Readings from Last 3 Encounters:  07/12/20 153 lb (69.4 kg)  07/08/20 156 lb (70.8 kg)  06/21/20 156 lb (70.8 kg)     Objective:    Vital Signs:  BP 124/63   Pulse 68   Ht 5\' 6"  (1.676 m)   Wt 153 lb (69.4 kg)   BMI 24.69 kg/m    VITAL SIGNS:  reviewed GEN:  no acute distress RESPIRATORY:  normal respiratory effort, symmetric expansion CARDIOVASCULAR:  no peripheral edema  ASSESSMENT  & PLAN:    PAF on flecainide diltiazem and Eliquis was having a lot of breakthrough.  Dr. Tamala Julian recommended increasing flecainide to 100 mg twice daily.  Exercise Myoview 07/08/2020 which was normal.  Patient has had no breakthrough atrial fib since flecainide increase.  She is fatigued on this higher dose but getting used to it.  She will keep track of her heart rate at home and call us if she wants to be seen or any adjustments need to be made.  Hypertension blood pressure was up the last office visit but normal  today.  No changes.   COVID-19 Education: The signs and symptoms of COVID-19 were discussed with the patient and how to seek care for testing (follow up with PCP or arrange E-visit).   The importance of social distancing was discussed today.  Time:   Today, I have spent 5:20 minutes with the patient with telehealth technology discussing the above problems.     Medication Adjustments/Labs and Tests Ordered: Current medicines are reviewed at length with the patient today.  Concerns regarding medicines are outlined above.   Tests Ordered: No orders of the defined types were placed in this encounter.   Medication Changes: No orders of the defined types were placed in this encounter.   Follow Up:  In Person in 6 month(s) Dr. Tamala Julian  Signed, Ermalinda Barrios, PA-C  07/12/2020 11:14 AM    [place]

## 2020-07-08 ENCOUNTER — Ambulatory Visit (HOSPITAL_COMMUNITY): Payer: PPO | Attending: Cardiovascular Disease

## 2020-07-08 ENCOUNTER — Other Ambulatory Visit: Payer: Self-pay

## 2020-07-08 DIAGNOSIS — Z79899 Other long term (current) drug therapy: Secondary | ICD-10-CM | POA: Insufficient documentation

## 2020-07-08 DIAGNOSIS — Z5181 Encounter for therapeutic drug level monitoring: Secondary | ICD-10-CM | POA: Insufficient documentation

## 2020-07-08 DIAGNOSIS — I480 Paroxysmal atrial fibrillation: Secondary | ICD-10-CM | POA: Diagnosis not present

## 2020-07-08 DIAGNOSIS — I10 Essential (primary) hypertension: Secondary | ICD-10-CM | POA: Diagnosis not present

## 2020-07-08 LAB — MYOCARDIAL PERFUSION IMAGING
Estimated workload: 7.9 METS
Exercise duration (min): 7 min
Exercise duration (sec): 30 s
LV dias vol: 76 mL (ref 46–106)
LV sys vol: 19 mL
MPHR: 135 {beats}/min
Peak HR: 115 {beats}/min
Percent HR: 85 %
Rest HR: 62 {beats}/min
SDS: 0
SRS: 0
SSS: 0
TID: 0.9

## 2020-07-08 MED ORDER — TECHNETIUM TC 99M TETROFOSMIN IV KIT
10.7000 | PACK | Freq: Once | INTRAVENOUS | Status: AC | PRN
  Administered 2020-07-08: 10.7 via INTRAVENOUS
  Filled 2020-07-08: qty 11

## 2020-07-08 MED ORDER — TECHNETIUM TC 99M TETROFOSMIN IV KIT
31.1000 | PACK | Freq: Once | INTRAVENOUS | Status: AC | PRN
  Administered 2020-07-08: 31.1 via INTRAVENOUS
  Filled 2020-07-08: qty 32

## 2020-07-12 ENCOUNTER — Telehealth (INDEPENDENT_AMBULATORY_CARE_PROVIDER_SITE_OTHER): Payer: PPO | Admitting: Physician Assistant

## 2020-07-12 ENCOUNTER — Other Ambulatory Visit: Payer: Self-pay

## 2020-07-12 ENCOUNTER — Encounter: Payer: Self-pay | Admitting: Physician Assistant

## 2020-07-12 ENCOUNTER — Telehealth: Payer: Self-pay

## 2020-07-12 VITALS — BP 124/63 | HR 68 | Ht 66.0 in | Wt 153.0 lb

## 2020-07-12 DIAGNOSIS — I480 Paroxysmal atrial fibrillation: Secondary | ICD-10-CM | POA: Diagnosis not present

## 2020-07-12 DIAGNOSIS — I10 Essential (primary) hypertension: Secondary | ICD-10-CM

## 2020-07-12 NOTE — Patient Instructions (Signed)
Medication Instructions:  Your physician recommends that you continue on your current medications as directed. Please refer to the Current Medication list given to you today.  *If you need a refill on your cardiac medications before your next appointment, please call your pharmacy*   Lab Work: None ordered  If you have labs (blood work) drawn today and your tests are completely normal, you will receive your results only by: [name] MyChart Message (if you have MyChart) OR . A paper copy in the mail If you have any lab test that is abnormal or we need to change your treatment, we will call you to review the results.   Testing/Procedures: None ordered   Follow-Up: At [facility], you and your health needs are our priority.  As part of our continuing mission to provide you with exceptional heart care, we have created designated Provider Care Teams.  These Care Teams include your primary Cardiologist (physician) and Advanced Practice Providers (APPs -  Physician Assistants and Nurse Practitioners) who all work together to provide you with the care you need, when you need it.  We recommend signing up for the patient portal called "MyChart".  Sign up information is provided on this After Visit Summary.  MyChart is used to connect with patients for Virtual Visits (Telemedicine).  Patients are able to view lab/test results, encounter notes, upcoming appointments, etc.  Non-urgent messages can be sent to your provider as well.   To learn more about what you can do with MyChart, go to [email or web address].    Your next appointment:   6 month(s)  The format for your next appointment:   In Person  Provider:   Daneen Schick, MD   Other Instructions None

## 2020-07-12 NOTE — Telephone Encounter (Signed)
  Patient Consent for Virtual Visit         Katherine Powell has provided verbal consent on 07/12/2020 for a virtual visit (video or telephone).   CONSENT FOR VIRTUAL VISIT FOR:  Katherine Powell  By participating in this virtual visit I agree to the following:  I hereby voluntarily request, consent and authorize [facility] and its employed or contracted physicians, physician assistants, nurse practitioners or other licensed health care professionals (the Practitioner), to provide me with telemedicine health care services (the "Services") as deemed necessary by the treating Practitioner. I acknowledge and consent to receive the Services by the Practitioner via telemedicine. I understand that the telemedicine visit will involve communicating with the Practitioner through live audiovisual communication technology and the disclosure of certain medical information by electronic transmission. I acknowledge that I have been given the opportunity to request an in-person assessment or other available alternative prior to the telemedicine visit and am voluntarily participating in the telemedicine visit.  I understand that I have the right to withhold or withdraw my consent to the use of telemedicine in the course of my care at any time, without affecting my right to future care or treatment, and that the Practitioner or I may terminate the telemedicine visit at any time. I understand that I have the right to inspect all information obtained and/or recorded in the course of the telemedicine visit and may receive copies of available information for a reasonable fee.  I understand that some of the potential risks of receiving the Services via telemedicine include:  [name] Delay or interruption in medical evaluation due to technological equipment failure or disruption; . Information transmitted may not be sufficient (e.g. poor resolution of images) to allow for appropriate medical decision making by the  Practitioner; and/or  . In rare instances, security protocols could fail, causing a breach of personal health information.  Furthermore, I acknowledge that it is my responsibility to provide information about my medical history, conditions and care that is complete and accurate to the best of my ability. I acknowledge that Practitioner's advice, recommendations, and/or decision may be based on factors not within their control, such as incomplete or inaccurate data provided by me or distortions of diagnostic images or specimens that may result from electronic transmissions. I understand that the practice of medicine is not an exact science and that Practitioner makes no warranties or guarantees regarding treatment outcomes. I acknowledge that a copy of this consent can be made available to me via my patient portal ([place]), or I can request a printed copy by calling the office of [facility].    I understand that my insurance will be billed for this visit.   I have read or had this consent read to me. . I understand the contents of this consent, which adequately explains the benefits and risks of the Services being provided via telemedicine.  . I have been provided ample opportunity to ask questions regarding this consent and the Services and have had my questions answered to my satisfaction. . I give my informed consent for the services to be provided through the use of telemedicine in my medical care

## 2020-07-19 DIAGNOSIS — N183 Chronic kidney disease, stage 3 unspecified: Secondary | ICD-10-CM | POA: Diagnosis not present

## 2020-07-19 DIAGNOSIS — Z79899 Other long term (current) drug therapy: Secondary | ICD-10-CM | POA: Diagnosis not present

## 2020-07-19 DIAGNOSIS — H9190 Unspecified hearing loss, unspecified ear: Secondary | ICD-10-CM | POA: Diagnosis not present

## 2020-07-19 DIAGNOSIS — I10 Essential (primary) hypertension: Secondary | ICD-10-CM | POA: Diagnosis not present

## 2020-07-19 DIAGNOSIS — M85851 Other specified disorders of bone density and structure, right thigh: Secondary | ICD-10-CM | POA: Diagnosis not present

## 2020-07-19 DIAGNOSIS — Z0000 Encounter for general adult medical examination without abnormal findings: Secondary | ICD-10-CM | POA: Diagnosis not present

## 2020-07-19 DIAGNOSIS — C50911 Malignant neoplasm of unspecified site of right female breast: Secondary | ICD-10-CM | POA: Diagnosis not present

## 2020-07-19 DIAGNOSIS — T300 Burn of unspecified body region, unspecified degree: Secondary | ICD-10-CM | POA: Diagnosis not present

## 2020-07-19 DIAGNOSIS — L719 Rosacea, unspecified: Secondary | ICD-10-CM | POA: Diagnosis not present

## 2020-07-19 DIAGNOSIS — N3946 Mixed incontinence: Secondary | ICD-10-CM | POA: Diagnosis not present

## 2020-07-19 DIAGNOSIS — R1313 Dysphagia, pharyngeal phase: Secondary | ICD-10-CM | POA: Diagnosis not present

## 2020-07-19 DIAGNOSIS — I480 Paroxysmal atrial fibrillation: Secondary | ICD-10-CM | POA: Diagnosis not present

## 2020-08-01 DIAGNOSIS — I480 Paroxysmal atrial fibrillation: Secondary | ICD-10-CM | POA: Diagnosis not present

## 2020-08-01 DIAGNOSIS — M85851 Other specified disorders of bone density and structure, right thigh: Secondary | ICD-10-CM | POA: Diagnosis not present

## 2020-08-01 DIAGNOSIS — N183 Chronic kidney disease, stage 3 unspecified: Secondary | ICD-10-CM | POA: Diagnosis not present

## 2020-08-01 DIAGNOSIS — J309 Allergic rhinitis, unspecified: Secondary | ICD-10-CM | POA: Diagnosis not present

## 2020-08-01 DIAGNOSIS — N951 Menopausal and female climacteric states: Secondary | ICD-10-CM | POA: Diagnosis not present

## 2020-08-01 DIAGNOSIS — I10 Essential (primary) hypertension: Secondary | ICD-10-CM | POA: Diagnosis not present

## 2020-08-01 DIAGNOSIS — F419 Anxiety disorder, unspecified: Secondary | ICD-10-CM | POA: Diagnosis not present

## 2020-08-01 DIAGNOSIS — J3089 Other allergic rhinitis: Secondary | ICD-10-CM | POA: Diagnosis not present

## 2020-08-01 DIAGNOSIS — C50911 Malignant neoplasm of unspecified site of right female breast: Secondary | ICD-10-CM | POA: Diagnosis not present

## 2020-08-10 DIAGNOSIS — Z947 Corneal transplant status: Secondary | ICD-10-CM | POA: Diagnosis not present

## 2020-08-10 DIAGNOSIS — H02052 Trichiasis without entropian right lower eyelid: Secondary | ICD-10-CM | POA: Diagnosis not present

## 2020-08-18 ENCOUNTER — Other Ambulatory Visit: Payer: Self-pay | Admitting: Neurosurgery

## 2020-08-18 DIAGNOSIS — M5416 Radiculopathy, lumbar region: Secondary | ICD-10-CM

## 2020-08-29 DIAGNOSIS — C50911 Malignant neoplasm of unspecified site of right female breast: Secondary | ICD-10-CM | POA: Diagnosis not present

## 2020-08-29 DIAGNOSIS — I480 Paroxysmal atrial fibrillation: Secondary | ICD-10-CM | POA: Diagnosis not present

## 2020-08-29 DIAGNOSIS — N183 Chronic kidney disease, stage 3 unspecified: Secondary | ICD-10-CM | POA: Diagnosis not present

## 2020-08-29 DIAGNOSIS — I10 Essential (primary) hypertension: Secondary | ICD-10-CM | POA: Diagnosis not present

## 2020-09-07 DIAGNOSIS — Z961 Presence of intraocular lens: Secondary | ICD-10-CM | POA: Diagnosis not present

## 2020-09-07 DIAGNOSIS — H02042 Spastic entropion of right lower eyelid: Secondary | ICD-10-CM | POA: Diagnosis not present

## 2020-09-07 DIAGNOSIS — H353131 Nonexudative age-related macular degeneration, bilateral, early dry stage: Secondary | ICD-10-CM | POA: Diagnosis not present

## 2020-09-07 DIAGNOSIS — Z947 Corneal transplant status: Secondary | ICD-10-CM | POA: Diagnosis not present

## 2020-09-07 DIAGNOSIS — H0102A Squamous blepharitis right eye, upper and lower eyelids: Secondary | ICD-10-CM | POA: Diagnosis not present

## 2020-09-07 DIAGNOSIS — H0102B Squamous blepharitis left eye, upper and lower eyelids: Secondary | ICD-10-CM | POA: Diagnosis not present

## 2020-09-07 DIAGNOSIS — H43813 Vitreous degeneration, bilateral: Secondary | ICD-10-CM | POA: Diagnosis not present

## 2020-09-07 DIAGNOSIS — H16223 Keratoconjunctivitis sicca, not specified as Sjogren's, bilateral: Secondary | ICD-10-CM | POA: Diagnosis not present

## 2020-09-13 ENCOUNTER — Ambulatory Visit
Admission: RE | Admit: 2020-09-13 | Discharge: 2020-09-13 | Disposition: A | Discharge status: Home / Self Care | Payer: PPO | Source: Ambulatory Visit | Attending: Neurosurgery | Admitting: Neurosurgery

## 2020-09-13 DIAGNOSIS — M5416 Radiculopathy, lumbar region: Secondary | ICD-10-CM

## 2020-09-13 DIAGNOSIS — M47816 Spondylosis without myelopathy or radiculopathy, lumbar region: Secondary | ICD-10-CM | POA: Diagnosis not present

## 2020-09-13 MED ORDER — GADOBENATE DIMEGLUMINE 529 MG/ML IV SOLN
13.0000 mL | Freq: Once | INTRAVENOUS | Status: AC | PRN
  Administered 2020-09-13: 13 mL via INTRAVENOUS

## 2020-09-14 DIAGNOSIS — M5416 Radiculopathy, lumbar region: Secondary | ICD-10-CM | POA: Diagnosis not present

## 2020-09-19 DIAGNOSIS — N183 Chronic kidney disease, stage 3 unspecified: Secondary | ICD-10-CM | POA: Diagnosis not present

## 2020-09-19 DIAGNOSIS — C50911 Malignant neoplasm of unspecified site of right female breast: Secondary | ICD-10-CM | POA: Diagnosis not present

## 2020-09-19 DIAGNOSIS — I480 Paroxysmal atrial fibrillation: Secondary | ICD-10-CM | POA: Diagnosis not present

## 2020-09-19 DIAGNOSIS — I10 Essential (primary) hypertension: Secondary | ICD-10-CM | POA: Diagnosis not present

## 2020-10-11 DIAGNOSIS — I480 Paroxysmal atrial fibrillation: Secondary | ICD-10-CM | POA: Diagnosis not present

## 2020-10-11 DIAGNOSIS — N183 Chronic kidney disease, stage 3 unspecified: Secondary | ICD-10-CM | POA: Diagnosis not present

## 2020-10-11 DIAGNOSIS — C50911 Malignant neoplasm of unspecified site of right female breast: Secondary | ICD-10-CM | POA: Diagnosis not present

## 2020-10-11 DIAGNOSIS — I10 Essential (primary) hypertension: Secondary | ICD-10-CM | POA: Diagnosis not present

## 2020-11-21 DIAGNOSIS — H0102B Squamous blepharitis left eye, upper and lower eyelids: Secondary | ICD-10-CM | POA: Diagnosis not present

## 2020-11-21 DIAGNOSIS — H02052 Trichiasis without entropian right lower eyelid: Secondary | ICD-10-CM | POA: Diagnosis not present

## 2020-11-21 DIAGNOSIS — Z947 Corneal transplant status: Secondary | ICD-10-CM | POA: Diagnosis not present

## 2020-11-21 DIAGNOSIS — H02042 Spastic entropion of right lower eyelid: Secondary | ICD-10-CM | POA: Diagnosis not present

## 2020-11-21 DIAGNOSIS — H0102A Squamous blepharitis right eye, upper and lower eyelids: Secondary | ICD-10-CM | POA: Diagnosis not present

## 2020-11-21 DIAGNOSIS — H26492 Other secondary cataract, left eye: Secondary | ICD-10-CM | POA: Diagnosis not present

## 2020-11-21 DIAGNOSIS — H16223 Keratoconjunctivitis sicca, not specified as Sjogren's, bilateral: Secondary | ICD-10-CM | POA: Diagnosis not present

## 2020-11-21 DIAGNOSIS — Z961 Presence of intraocular lens: Secondary | ICD-10-CM | POA: Diagnosis not present

## 2020-11-21 DIAGNOSIS — H43813 Vitreous degeneration, bilateral: Secondary | ICD-10-CM | POA: Diagnosis not present

## 2020-11-21 DIAGNOSIS — H353131 Nonexudative age-related macular degeneration, bilateral, early dry stage: Secondary | ICD-10-CM | POA: Diagnosis not present

## 2020-12-01 DIAGNOSIS — N183 Chronic kidney disease, stage 3 unspecified: Secondary | ICD-10-CM | POA: Diagnosis not present

## 2020-12-01 DIAGNOSIS — I10 Essential (primary) hypertension: Secondary | ICD-10-CM | POA: Diagnosis not present

## 2020-12-01 DIAGNOSIS — I480 Paroxysmal atrial fibrillation: Secondary | ICD-10-CM | POA: Diagnosis not present

## 2020-12-01 DIAGNOSIS — C50911 Malignant neoplasm of unspecified site of right female breast: Secondary | ICD-10-CM | POA: Diagnosis not present

## 2020-12-16 ENCOUNTER — Other Ambulatory Visit: Payer: Self-pay

## 2020-12-16 MED ORDER — APIXABAN 5 MG PO TABS: 5.0000 mg | ORAL_TABLET | Freq: Two times a day (BID) | ORAL | 1 refills | Status: DC

## 2020-12-16 MED ORDER — FLECAINIDE ACETATE 100 MG PO TABS
100.0000 mg | ORAL_TABLET | Freq: Two times a day (BID) | ORAL | 1 refills | Status: DC
Start: 2020-12-16 — End: 2020-12-27

## 2020-12-16 NOTE — Telephone Encounter (Signed)
Pt last saw Ermalinda Barrios, PA on 07/12/20, last labs 06/21/20 Creat 1.07, age 87, weight 69.4kg, based on specified criteria pt is on appropriate dosage of Eliquis 5mg  BID.  Will refill rx.

## 2020-12-16 NOTE — Telephone Encounter (Signed)
Pt calling requesting a refill for Eliquis be sent to mail order pharmacy [facility]. Please address

## 2020-12-27 ENCOUNTER — Other Ambulatory Visit: Payer: Self-pay | Admitting: Interventional Cardiology

## 2020-12-27 MED ORDER — FLECAINIDE ACETATE 100 MG PO TABS: 100.0000 mg | ORAL_TABLET | Freq: Two times a day (BID) | ORAL | 3 refills | Status: DC

## 2020-12-27 MED ORDER — DILTIAZEM HCL ER COATED BEADS 120 MG PO CP24: 120.0000 mg | ORAL_CAPSULE | Freq: Every evening | ORAL | 3 refills | Status: DC

## 2020-12-27 MED ORDER — APIXABAN 5 MG PO TABS: 5.0000 mg | ORAL_TABLET | Freq: Two times a day (BID) | ORAL | 1 refills | Status: DC

## 2020-12-27 NOTE — Telephone Encounter (Signed)
Prescription refill request for Eliquis received.  Indication: afib Last office visit: 07/12/2020, Lenze Scr: 1.07, 06/21/2020 Age: 87 yo  Weight: 69.4 kg   Prescription refill sent.

## 2020-12-27 NOTE — Telephone Encounter (Signed)
*  STAT* If patient is at the pharmacy, call can be transferred to refill team.   1. Which medications need to be refilled? (please list name of each medication and dose if known)  apixaban (ELIQUIS) 5 MG TABS tablet diltiazem (CARTIA XT) 120 MG 24 hr capsule flecainide (TAMBOCOR) 100 MG tablet     2. Which pharmacy/location (including street and city if local pharmacy) is medication to be sent to?   [facility] [place], [place]  3. Do they need a 30 day or 90 day supply? [place]

## 2020-12-28 DIAGNOSIS — Z947 Corneal transplant status: Secondary | ICD-10-CM | POA: Diagnosis not present

## 2020-12-28 DIAGNOSIS — H02032 Senile entropion of right lower eyelid: Secondary | ICD-10-CM | POA: Diagnosis not present

## 2020-12-28 DIAGNOSIS — H02052 Trichiasis without entropian right lower eyelid: Secondary | ICD-10-CM | POA: Diagnosis not present

## 2020-12-28 DIAGNOSIS — Z961 Presence of intraocular lens: Secondary | ICD-10-CM | POA: Diagnosis not present

## 2020-12-28 DIAGNOSIS — H04123 Dry eye syndrome of bilateral lacrimal glands: Secondary | ICD-10-CM | POA: Diagnosis not present

## 2021-01-02 DIAGNOSIS — Z947 Corneal transplant status: Secondary | ICD-10-CM | POA: Diagnosis not present

## 2021-01-09 NOTE — Progress Notes (Signed)
CARDIOLOGY OFFICE NOTE  Date:  01/23/2021    [name] Date of Birth: 06-Jun-1932 Medical Record #[id number]  PCP:  Cari Caraway, MD  Cardiologist:  Tamala Julian    Chief Complaint  Patient presents with  . Follow-up    Seen for Dr. Tamala Julian    History of Present Illness: Katherine Powell is a 87 y.o. female who presents today for a follow up visit. Seen for Dr. Tamala Julian.  She has a history of PAF on flecainide and is on anticoagulation with Eliquis.   Last seen by Dr. Tamala Julian in 02/2020 - doing well. Can take extra flecainide as needed for breakthrough Afib.  Last seen by a virtual visit last August by Ermalinda Barrios, PA - more frequent breakthru - Flecainide increased to 100 mg BID and follow up Myoview arranged. Was doing well with this regimen.   Comes in today. Here alone. She is doing ok. She got remarried about a year and a half ago - this has made her very happy. No chest pain. Not short of breath. No breakthru AF since the increased dose of her Flecainide. Remains on full dose Eliquis. No problems. Did scrape her right lower leg recently - it is red/warm - she has been using some antibiotic cream. She has had recent surveillance labs noted off the [place].   Past Medical History:  Diagnosis Date  . Anxiety   . Arrhythmia    atrial fibrillation/  on   . Arthritis   . Atrial fibrillation ([place])   . Breast cancer ([place]) 11/09/13   right upper outer  . Contact lens/glasses fitting    contact rt eye  . Hx of radiation therapy 12/15/13- 01/11/14   right breast 4250 cGy in 17 sessions, (hypo-fractionated), no boost  . Hypertension   . Postmenopausal   . Rosacea   . Seasonal allergies   . Sleep disturbance   . Thyroid disease   . Urinary incontinence     Past Surgical History:  Procedure Laterality Date  . ABDOMINAL HYSTERECTOMY  1973   endometriosis  . APPENDECTOMY  1954  . BACK SURGERY     lumb-2005  . BILATERAL OOPHORECTOMY     benign tumor on ovary  . BREAST  LUMPECTOMY WITH NEEDLE LOCALIZATION Right 11/09/2013   Procedure: BREAST LUMPECTOMY WITH NEEDLE LOCALIZATION;  Surgeon: Harl Bowie, MD;  Location: [facility];  Service: General;  Laterality: Right;  . CATARACT EXTRACTION  2013   both  . CYSTOCELE REPAIR  2008  . EYE SURGERY     cataracts-plugs for eyes  . RECTOCELE REPAIR  2008  . TONSILLECTOMY       Medications: Current Meds  Medication Sig  . acetaminophen (TYLENOL) 650 MG CR tablet Take 650 mg by mouth every 8 (eight) hours as needed for pain. Reported on 01/19/2016  . ALPRAZolam (XANAX) 0.5 MG tablet Take 1/2 to 1 tablet by mouth twice a day as needed  . apixaban (ELIQUIS) 5 MG TABS tablet Take 1 tablet (5 mg total) by mouth 2 (two) times daily.  . Biotin 1000 MCG tablet Take 1,000 mcg by mouth 2 (two) times daily.  . cephALEXin (KEFLEX) 250 MG capsule Take 1 capsule (250 mg total) by mouth 3 (three) times daily.  . cycloSPORINE (RESTASIS) 0.05 % ophthalmic emulsion 1 drop 2 (two) times daily.  [name] diltiazem (CARTIA XT) 120 MG 24 hr capsule Take 1 capsule (120 mg total) by mouth every evening.  . flecainide (  TAMBOCOR) 100 MG tablet Take 1 tablet (100 mg total) by mouth 2 (two) times daily.  . fluticasone (FLONASE) 50 MCG/ACT nasal spray Place 1 spray into both nostrils daily as needed for allergies. Reported on 01/19/2016  . glucosamine-chondroitin 500-400 MG tablet Take 1 tablet by mouth 2 (two) times daily.  [name] IRON-VITAMIN C PO Take 1 tablet by mouth at bedtime.  . melatonin 5 MG TABS Take 5 mg by mouth at bedtime.  . Multiple Vitamins-Minerals (PRESERVISION AREDS) TABS Take by mouth 2 (two) times daily.  . Omega-3 Fatty Acids (EQL OMEGA 3 FISH OIL) 1400 MG CAPS Take 1 capsule by mouth daily.  . prednisoLONE acetate (PRED FORTE) 1 % ophthalmic suspension Apply 1 drop to eye 2 (two) times daily.  . sodium chloride (OCEAN) 0.65 % nasal spray Place 1 spray into the nose as needed for congestion. Reported on  01/19/2016  . trimethoprim-polymyxin b (POLYTRIM) ophthalmic solution   . Zinc 50 MG TABS Take 50 mg by mouth daily.     Allergies: Allergies  Allergen Reactions  . Codeine Nausea Only  . Gabapentin Other (See Comments)    Makes her very sleepy    Social History: The patient  reports that she has never smoked. She has never used smokeless tobacco. She reports that she does not drink alcohol and does not use drugs.   Family History: The patient's family history includes Alzheimer's disease in her mother; Stroke in her father.   Review of Systems: Please see the history of present illness.   All other systems are reviewed and negative.   Physical Exam: VS:  BP 120/80   Pulse (!) 59   Ht 5\' 7"  (1.702 m)   Wt 158 lb 12.8 oz (72 kg)   SpO2 98%   BMI 24.87 kg/m  .  BMI Body mass index is 24.87 kg/m.  Wt Readings from Last 3 Encounters:  01/23/21 158 lb 12.8 oz (72 kg)  07/12/20 153 lb (69.4 kg)  07/08/20 156 lb (70.8 kg)    General: Alert and in no acute distress.   Cardiac: Regular rate and rhythm. No murmurs, rubs, or gallops. Trace edema. She has a wound on the right lower leg - red and warm to touch around it.  Respiratory:  Lungs are clear to auscultation bilaterally with normal work of breathing.  GI: Soft and nontender.  MS: No deformity or atrophy. Gait and ROM intact.  Skin: Warm and dry. Color is normal.  Neuro:  Strength and sensation are intact and no gross focal deficits noted.  Psych: Alert, appropriate and with normal affect.   LABORATORY DATA:  EKG:  EKG is ordered today.  Personally reviewed by me. This demonstrates NSR - brady - HR is 59 today.  Lab Results  Component Value Date   WBC 5.5 06/21/2020   HGB 12.2 06/21/2020   HCT 36.2 06/21/2020   PLT 212 06/21/2020   GLUCOSE 159 (H) 06/21/2020   ALT 13 01/26/2019   AST 17 01/26/2019   NA 138 06/21/2020   K 4.2 06/21/2020   CL 101 06/21/2020   CREATININE 1.07 (H) 06/21/2020   BUN 20 06/21/2020    CO2 25 06/21/2020   TSH 1.215 12/30/2015   INR 1.05 11/09/2013       BNP (last 3 results) No results for input(s): BNP in the last 8760 hours.  ProBNP (last 3 results) No results for input(s): PROBNP in the last 8760 hours.   Other Studies Reviewed Today:  Myoview 07/2020 Study Highlights    Nuclear stress EF: 74%. The left ventricular ejection fraction is hyperdynamic (>65%).  The patient walked for 7 minutes and 30 seconds of a standard Bruce protocol treadmill test. She achieved a peak heart rate of 115 which is 85% predicted maximal heart rate.  At peak exercise there were no ST or T wave changes.  This is a low risk study. There is no evidence of ischemia and no evidence of previous infarction.  The study is normal.    Echo Study Conclusions 05/2018  - Left ventricle: The cavity size was normal. There was mild focal  basal hypertrophy of the septum. Systolic function was normal.  The estimated ejection fraction was in the range of 60% to 65%.  Wall motion was normal; there were no regional wall motion  abnormalities. Features are consistent with a pseudonormal left  ventricular filling pattern, with concomitant abnormal relaxation  and increased filling pressure (grade 2 diastolic dysfunction).  - Aortic valve: There was mild regurgitation.  - Mitral valve: Systolic bowing without prolapse.  - Left atrium: The atrium was mildly dilated.     ASSESSMENT & PLAN:     1. PAF - on flecainide - remains on Eliquis - no more breakthru noted. She is very happy with how she is doing.   2. Probable early cellulitis - will give her a 5 day course of Keflex 250 mg TID. To see PCP if fails to improve.   3. HTN - BP looks good - no changes made.   4. Chronic anticoagulation - no problems noted.    Current medicines are reviewed with the patient today.  The patient does not have concerns regarding medicines other than what has been noted above.  The  following changes have been made:  See above.  Labs/ tests ordered today include:    Orders Placed This Encounter  Procedures  . EKG 12-Lead     Disposition:   FU with Dr. Tamala Julian in 4 to 6 months. Overall, she looks good. No changes made today.     Patient is agreeable to this plan and will call if any problems develop in the interim.   SignedTruitt Merle, NP  01/23/2021 10:25 AM  [facility] [address] [place], [place]  [address] Phone: [phone] Fax: [phone]

## 2021-01-23 ENCOUNTER — Other Ambulatory Visit: Payer: Self-pay

## 2021-01-23 ENCOUNTER — Encounter: Payer: Self-pay | Admitting: Nurse Practitioner

## 2021-01-23 ENCOUNTER — Ambulatory Visit (INDEPENDENT_AMBULATORY_CARE_PROVIDER_SITE_OTHER): Payer: Medicare Other | Admitting: Nurse Practitioner

## 2021-01-23 VITALS — BP 120/80 | HR 59 | Ht 67.0 in | Wt 158.8 lb

## 2021-01-23 DIAGNOSIS — I480 Paroxysmal atrial fibrillation: Secondary | ICD-10-CM

## 2021-01-23 DIAGNOSIS — G459 Transient cerebral ischemic attack, unspecified: Secondary | ICD-10-CM

## 2021-01-23 DIAGNOSIS — I10 Essential (primary) hypertension: Secondary | ICD-10-CM | POA: Diagnosis not present

## 2021-01-23 MED ORDER — CEPHALEXIN 250 MG PO CAPS: 250.0000 mg | ORAL_CAPSULE | Freq: Three times a day (TID) | ORAL | 0 refills | Status: DC

## 2021-01-23 NOTE — Patient Instructions (Addendum)
After Visit Summary:  We will be checking the following labs today - NONE   Medication Instructions:    Continue with your current medicines.  I did send you a RX for Keflex 250 mg to take 3 times a day for 5 days - this should help your leg.     If you need a refill on your cardiac medications before your next appointment, please call your pharmacy.     Testing/Procedures To Be Arranged:  N/A  Follow-Up:   See Dr. Tamala Julian in about 4 to 6 months.     At [facility], you and your health needs are our priority.  As part of our continuing mission to provide you with exceptional heart care, we have created designated Provider Care Teams.  These Care Teams include your primary Cardiologist (physician) and Advanced Practice Providers (APPs -  Physician Assistants and Nurse Practitioners) who all work together to provide you with the care you need, when you need it.  Special Instructions:  . Stay safe, wash your hands for at least 20 seconds and wear a mask when needed.  . It was good to talk with you today.    Call the [place] office at [phone] if you have any questions, problems or concerns.

## 2021-04-03 DIAGNOSIS — R350 Frequency of micturition: Secondary | ICD-10-CM | POA: Diagnosis not present

## 2021-04-07 ENCOUNTER — Telehealth: Payer: Self-pay | Admitting: Family

## 2021-04-07 NOTE — Telephone Encounter (Addendum)
Called to discuss with patient about COVID-19 symptoms and the use of one of the available treatments for those with mild to moderate Covid symptoms and at a high risk of hospitalization.  Pt appears to qualify for outpatient treatment due to co-morbid conditions and/or a member of an at-risk group in accordance with the FDA Emergency Use Authorization.    Symptom onset: 04/01/21 Tested positive: 04/03/21 Vaccinated: Yes Booster? Yes Immunocompromised? Unknown Qualifiers: Yes NIH Criteria: tier 3   Tells me she feels more like herself than she has in the last 4-5 days. Tells me she thinks she is back to her normal self.  Outside of window for MAB and oral antiviral. As she is feeling improved, no indication for treatment. She was appreciative of the call.  Loel Dubonnet

## 2021-04-14 ENCOUNTER — Other Ambulatory Visit: Payer: Self-pay

## 2021-04-14 ENCOUNTER — Encounter: Payer: Self-pay | Admitting: Family Medicine

## 2021-04-14 ENCOUNTER — Ambulatory Visit (INDEPENDENT_AMBULATORY_CARE_PROVIDER_SITE_OTHER): Payer: Medicare Other | Admitting: Family Medicine

## 2021-04-14 VITALS — BP 138/76 | HR 67 | Temp 98.0°F | Ht 66.0 in | Wt 154.4 lb

## 2021-04-14 DIAGNOSIS — I480 Paroxysmal atrial fibrillation: Secondary | ICD-10-CM | POA: Diagnosis not present

## 2021-04-14 DIAGNOSIS — E042 Nontoxic multinodular goiter: Secondary | ICD-10-CM

## 2021-04-14 DIAGNOSIS — N3946 Mixed incontinence: Secondary | ICD-10-CM | POA: Insufficient documentation

## 2021-04-14 DIAGNOSIS — J309 Allergic rhinitis, unspecified: Secondary | ICD-10-CM | POA: Insufficient documentation

## 2021-04-14 DIAGNOSIS — I10 Essential (primary) hypertension: Secondary | ICD-10-CM | POA: Diagnosis not present

## 2021-04-14 DIAGNOSIS — Z947 Corneal transplant status: Secondary | ICD-10-CM | POA: Insufficient documentation

## 2021-04-14 DIAGNOSIS — J301 Allergic rhinitis due to pollen: Secondary | ICD-10-CM

## 2021-04-14 DIAGNOSIS — Z860100 Personal history of colon polyps, unspecified: Secondary | ICD-10-CM | POA: Insufficient documentation

## 2021-04-14 DIAGNOSIS — N3000 Acute cystitis without hematuria: Secondary | ICD-10-CM | POA: Diagnosis not present

## 2021-04-14 DIAGNOSIS — L719 Rosacea, unspecified: Secondary | ICD-10-CM | POA: Insufficient documentation

## 2021-04-14 DIAGNOSIS — Z5181 Encounter for therapeutic drug level monitoring: Secondary | ICD-10-CM | POA: Insufficient documentation

## 2021-04-14 DIAGNOSIS — M5136 Other intervertebral disc degeneration, lumbar region: Secondary | ICD-10-CM

## 2021-04-14 DIAGNOSIS — T868411 Corneal transplant failure, right eye: Secondary | ICD-10-CM | POA: Insufficient documentation

## 2021-04-14 DIAGNOSIS — F419 Anxiety disorder, unspecified: Secondary | ICD-10-CM

## 2021-04-14 DIAGNOSIS — Z7901 Long term (current) use of anticoagulants: Secondary | ICD-10-CM | POA: Insufficient documentation

## 2021-04-14 DIAGNOSIS — H9113 Presbycusis, bilateral: Secondary | ICD-10-CM | POA: Insufficient documentation

## 2021-04-14 DIAGNOSIS — N1831 Chronic kidney disease, stage 3a: Secondary | ICD-10-CM | POA: Insufficient documentation

## 2021-04-14 HISTORY — DX: Rosacea, unspecified: L71.9

## 2021-04-14 LAB — POCT URINALYSIS DIPSTICK
Bilirubin, UA: NEGATIVE
Blood, UA: 200
Glucose, UA: NEGATIVE
Ketones, UA: NEGATIVE
Nitrite, UA: NEGATIVE
Protein, UA: POSITIVE — AB
Spec Grav, UA: 1.015 (ref 1.010–1.025)
Urobilinogen, UA: 0.2 E.U./dL
pH, UA: 6 (ref 5.0–8.0)

## 2021-04-14 MED ORDER — NITROFURANTOIN MONOHYD MACRO 100 MG PO CAPS: 100.0000 mg | ORAL_CAPSULE | Freq: Two times a day (BID) | ORAL | 0 refills | Status: DC

## 2021-04-14 NOTE — Progress Notes (Signed)
[facility] [address] [place] [address] Dept: [phone] Dept Fax: [phone]  New Patient Office Visit  Subjective:    Patient ID: Roger Kill, female    DOB: 04-07-1932, 87 y.o..   MRN: [id number]  Chief Complaint  Patient presents with  . Establish Care    NP- establish care, review chronic conditions. C/o of dysuria   History of Present Illness:  Patient is in today to establish care. Ms. Rothgeb is originally from [place], [place]. Although she lived for about 18 years in [place], [place], she has been back in [place] for 32 years. Her first husband died, but she remarried a high school sweetheart 1 1/2 years ago. She has 3 children, 7 grandchildren, and 7 great grandchildren. She denies any tobacco or drug use. She does drink up to 2 small glasses of wine a month.  Ms. Carbon has a history of atrial fibrillation. This is currently controlled with flecainide. She is on Eliquis for anticoagulation. She is also on diltiazem, which also helps control her hypertension.  Ms. Curet has a history of allergic rhinitis. She uses an OTC allergy med to control this.  Ms. Mucha has a history of a multiple thyroid nodules. She had FNA of these in 2018 that showed benign follicular nodules.  Ms. Duffy has a history of lumbar disc disease with lumbar stenosis and spondylolisthesis. She has had prior lumbar surgery. She notes that in general this is better than int he past. She manages with stretches and yoga to control her pain.  Ms. Czerwonka has a history of a Stage 1 right breast cancer. She had a lumpectomy with radiation for this.  Ms. Viveiros had right corneal transplant and is follwoed by Dr. Katy Fitch. She also had a right blepharoplasty due to entropion.  Ms. Kaufmann has occasional anxiety and is managed with PRN Xanax. She notes she gets a prescription filled about 2 times a year.  Ms. Miranda states  she contracted COVID-19 on May 1. She was sick for several days, but feels fully recovered at this point. Around that time she had some urinary symptoms. She was treated with Keflex 500 mg bid for 5 days and had improvement in her symptoms, but now the last few days, she has had a return of dysuria with tingling and burning.  Past Medical History: Patient Active Problem List   Diagnosis Date Noted  . Long term (current) use of anticoagulants 04/14/2021  . Personal history of colonic polyps 04/14/2021  . Mixed incontinence 04/14/2021  . History of cornea transplant 04/14/2021  . Essential hypertension 04/14/2021  . Degeneration of lumbar intervertebral disc 04/14/2021  . Corneal transplant failure, right eye 04/14/2021  . Chronic kidney disease, stage 3a ([place]) 04/14/2021  . Anxiety 04/14/2021  . Allergic rhinitis 04/14/2021  . Red blood cell antibody positive 12/05/2018  . Multinodular goiter (nontoxic) 02/11/2017  . Spinal stenosis of lumbar region 10/31/2016  . Degenerative spondylolisthesis 10/11/2016  . Osteopenia 07/18/2016  . Lumbar radiculopathy 01/05/2015  . Paroxysmal atrial fibrillation ([place]) 12/29/2014  . Postmenopausal estrogen deficiency 01/27/2014  . Malignant neoplasm of upper-outer quadrant of right breast in female, estrogen receptor positive ([place]) 11/17/2013   Past Surgical History:  Procedure Laterality Date  . ABDOMINAL HYSTERECTOMY  1973   endometriosis  . APPENDECTOMY  1954  . BACK SURGERY     lumb-2005  . BILATERAL OOPHORECTOMY     benign tumor on ovary  . BREAST LUMPECTOMY WITH NEEDLE LOCALIZATION  Right 11/09/2013   Procedure: BREAST LUMPECTOMY WITH NEEDLE LOCALIZATION;  Surgeon: Harl Bowie, MD;  Location: [facility];  Service: General;  Laterality: Right;  . CATARACT EXTRACTION  2013   both  . CYSTOCELE REPAIR  2008  . EYE SURGERY     cataracts-plugs for eyes  . RECTOCELE REPAIR  2008  . TONSILLECTOMY     Family History   Problem Relation Age of Onset  . Alzheimer's disease Mother   . Stroke Father   . Heart attack Neg Hx    Outpatient Medications Prior to Visit  Medication Sig Dispense Refill  . acetaminophen (TYLENOL) 650 MG CR tablet Take 650 mg by mouth every 8 (eight) hours as needed for pain. Reported on 01/19/2016    . ALPRAZolam (XANAX) 0.5 MG tablet Take 1/2 to 1 tablet by mouth twice a day as needed    . apixaban (ELIQUIS) 5 MG TABS tablet Take 1 tablet (5 mg total) by mouth 2 (two) times daily. 180 tablet 1  . Ascorbic Acid (VITAMIN C) 1000 MG tablet 1 tablet    . cycloSPORINE (RESTASIS) 0.05 % ophthalmic emulsion 1 drop 2 (two) times daily.    [name] diltiazem (CARTIA XT) 120 MG 24 hr capsule Take 1 capsule (120 mg total) by mouth every evening. 90 capsule 3  . flecainide (TAMBOCOR) 100 MG tablet Take 1 tablet (100 mg total) by mouth 2 (two) times daily. 180 tablet 3  . glucosamine-chondroitin 500-400 MG tablet Take 1 tablet by mouth 2 (two) times daily.    [name] IRON-VITAMIN C PO Take 1 tablet by mouth at bedtime.    . melatonin 5 MG TABS Take 5 mg by mouth at bedtime.    . Multiple Vitamins-Minerals (PRESERVISION AREDS) TABS Take by mouth 2 (two) times daily.    . Omega-3 Fatty Acids (EQL OMEGA 3 FISH OIL) 1400 MG CAPS Take 1 capsule by mouth daily.    . prednisoLONE acetate (PRED FORTE) 1 % ophthalmic suspension Apply 1 drop to eye 2 (two) times daily.    . sodium chloride (OCEAN) 0.65 % nasal spray Place 1 spray into the nose as needed for congestion. Reported on 01/19/2016    . trimethoprim-polymyxin b (POLYTRIM) ophthalmic solution     . Zinc 50 MG TABS Take 50 mg by mouth daily.    . Calcium-Phosphorus-Vitamin D (CITRACAL +D3) [phone] MG-MG-UNIT CHEW     . Biotin 1000 MCG tablet Take 1,000 mcg by mouth 2 (two) times daily. (Patient not taking: Reported on 04/14/2021)    . cephALEXin (KEFLEX) 250 MG capsule Take 1 capsule (250 mg total) by mouth 3 (three) times daily. (Patient not taking: Reported  on 04/14/2021) 15 capsule 0  . fluticasone (FLONASE) 50 MCG/ACT nasal spray Place 1 spray into both nostrils daily as needed for allergies. Reported on 01/19/2016 (Patient not taking: Reported on 04/14/2021)     No facility-administered medications prior to visit.   Allergies  Allergen Reactions  . Codeine Nausea Only  . Gabapentin Other (See Comments)    Makes her very sleepy    Objective:   Today's Vitals   04/14/21 0909  BP: 138/76  Pulse: 67  Temp: 98 F (36.7 C)  TempSrc: Temporal  SpO2: 95%  Weight: 154 lb 6.4 oz (70 kg)  Height: 5\' 6"  (1.676 m)   Body mass index is 24.92 kg/m.   General: Well developed, well nourished. No acute distress. HEENT: Normocephalic, non-traumatic. The right pupil is abnormally shaped. There  is a contact lens in place. EOMI. Conjunctiva clear. External   ears normal. EAC and TMs normal bilaterally. Nose clear without congestion or rhinorrhea. Mucous membranes moist. There are tortuous bony   nodules of the hard palate. Oropharynx clear. Good dentition. Neck: Supple. No lymphadenopathy. There are nodular changes to the thyroid. Lungs: Clear to auscultation bilaterally. No wheezing, rales or rhonchi. CV: RRR without murmurs or rubs. Pulses 2+ bilaterally. Abdomen: Soft, mild RLQ tenderness to palpation. Bowel sounds positive, normal pitch and frequency. No hepatosplenomegaly. No rebound or   guarding. Back: Straight. No CVA tenderness bilaterally. Extremities: Full ROM. Multiple nodular changes around most DIP and PIP joints of the hands. Mild lower leg edema L>R. Multiple spider   varicosities. Skin: Warm and dry. No rashes. Neuro:CN II-XII grossly intact. Normal sensation and DTR bilaterally. Psych: Alert and oriented. Normal mood and affect.  There are no preventive care reminders to display for this patient.   Lab Results: Urinalysis Neg. for glucose, bili, ketones, and nitrite. 3+ blood 3+ leukocytes 1+ protein   Assessment & Plan:    1. Essential hypertension Blood pressure is adequately controlled on diltiazem.  2. Paroxysmal atrial fibrillation (HCC) Currently appears to be in a normal sinus rhythm. Managed well with flecainide. We will continue Eliquis for anticoagulation.  3. Seasonal allergic rhinitis due to pollen Stable on OTCs.  4. Multinodular goiter (nontoxic) Reviewed prior Ultrasounds and biopsy reports. These appear benign. NO need for follow-up.  5. Degeneration of lumbar intervertebral disc Reviewed prior MRI scan of lumbar spine. Patient managing with nonpharmacologic approaches.  6. Anxiety Patient has occasional use of Xanax. We will continue this PRN.  7. Mixed incontinence Manages with pads. Has had prior cystocele and rectocele repair. Will monitor for now.  8. Acute cystitis without hematuria Dipstick suggestive of acute UTI. I will place her on a 10 day course of nitrofurantoin. She should continue to push fluids. Follow-up if not resolving.  - POCT Urinalysis Dipstick - nitrofurantoin, macrocrystal-monohydrate, (MACROBID) 100 MG capsule; Take 1 capsule (100 mg total) by mouth 2 (two) times daily.  Dispense: 20 capsule; Refill: 0   Haydee Salter, MD

## 2021-05-12 ENCOUNTER — Ambulatory Visit: Payer: Medicare Other | Admitting: Interventional Cardiology

## 2021-05-18 ENCOUNTER — Other Ambulatory Visit: Payer: Self-pay | Admitting: Interventional Cardiology

## 2021-05-18 NOTE — Telephone Encounter (Signed)
Age 87, weight 70kg, SCr 1.07 on 06/21/20, afib indication, last visit 2/22

## 2021-05-24 ENCOUNTER — Other Ambulatory Visit: Payer: Self-pay | Admitting: Family Medicine

## 2021-05-24 NOTE — Telephone Encounter (Signed)
Refill request for:  Alprazolam 0.5 mg LR HX provider LOV 04/14/21 FOV  10/18/21  Please review and advise.  Thanks. Dm/cma

## 2021-06-20 ENCOUNTER — Telehealth: Payer: Self-pay | Admitting: Family Medicine

## 2021-06-20 NOTE — Telephone Encounter (Signed)
Please review message and advise.   Thanks. Dm/cma  

## 2021-06-20 NOTE — Telephone Encounter (Signed)
Pt called and said that previous provider was checking D3 and said it was high and wanted to recheck it on 07/11/21. She wanted to know if Dr Gena Fray wanted to check it then and if so if she could get an appt to check it

## 2021-06-22 NOTE — Telephone Encounter (Signed)
Patient notified VIA phone and scheduled for 07/31/21 @ 2:30 pm. Dm/cma

## 2021-07-03 ENCOUNTER — Encounter: Payer: Self-pay | Admitting: Oncology

## 2021-07-12 NOTE — Progress Notes (Addendum)
Cardiology Office Note:    Date:  07/13/2021   ID:  Katherine Powell, DOB 1932/01/05, MRN [id number]  PCP:  Haydee Salter, MD  Cardiologist:  None   Referring MD: Cari Caraway, MD   Chief Complaint  Patient presents with   Atrial Fibrillation     History of Present Illness:    Katherine Powell is a 87 y.o. female with a hx of paroxysmal atrial fibrillation, rhythm control with flecainide therapy, and chronic anticoagulation using Eliquis.   Has a [place] river boat trip/cruise coming up over the next 2 weeks.  She flies to [place] this Saturday.  She is physically active.  Not having any shortness of breath, palpitations, syncope, or lower extremity swelling.  There is a history of atrial fibs.  She feels she has had few if any episodes of atrial fibrillation.  She thinks her last episode may have been in December.  It was relatively short-lived. She denies medication side effects.  She is not noted blood in her urine.  She did have a bladder infection in May.  She has some recurrent burning with urination presently.  Encouraged her to speak with primary care about this before leaving for her trip this weekend.   Past Medical History:  Diagnosis Date   Acne rosacea 04/14/2021   Allergy    Anxiety    Arrhythmia    atrial fibrillation/  on    Arthritis    Atrial fibrillation ([place])    Breast cancer ([place]) 11/09/13   right upper outer   Contact lens/glasses fitting    contact rt eye   Hx of radiation therapy 12/15/13- 01/11/14   right breast 4250 cGy in 17 sessions, (hypo-fractionated), no boost   Hypertension    Postmenopausal    Rosacea    Seasonal allergies    Sleep disturbance    Thyroid disease    Urinary incontinence     Past Surgical History:  Procedure Laterality Date   ABDOMINAL HYSTERECTOMY  1973   endometriosis   APPENDECTOMY  1954   BACK SURGERY     lumb-2005   BILATERAL OOPHORECTOMY     benign tumor on ovary   BREAST LUMPECTOMY WITH NEEDLE  LOCALIZATION Right 11/09/2013   Procedure: BREAST LUMPECTOMY WITH NEEDLE LOCALIZATION;  Surgeon: Harl Bowie, MD;  Location: [facility];  Service: General;  Laterality: Right;   CATARACT EXTRACTION  2013   both   CYSTOCELE REPAIR  2008   EYE SURGERY     cataracts-plugs for eyes   RECTOCELE REPAIR  2008   TONSILLECTOMY      Current Medications: Current Meds  Medication Sig   acetaminophen (TYLENOL) 650 MG CR tablet Take 650 mg by mouth every 8 (eight) hours as needed for pain. Reported on 01/19/2016   ALPRAZolam (XANAX) 0.5 MG tablet TAKE 1/2 TO 1 TABLET BY MOUTH TWICE DAILY AS NEEDED FOR 30 DAYS   apixaban (ELIQUIS) 5 MG TABS tablet TAKE 1 TABLET TWICE A DAY   Calcium-Phosphorus-Vitamin D (CITRACAL +D3) 250-107-500 MG-MG-UNIT CHEW    cycloSPORINE (RESTASIS) 0.05 % ophthalmic emulsion 1 drop 2 (two) times daily.   diltiazem (CARTIA XT) 120 MG 24 hr capsule Take 1 capsule (120 mg total) by mouth every evening.   diphenhydrAMINE (BENADRYL) 25 MG tablet Take 25 mg by mouth every 6 (six) hours as needed.   fexofenadine (ALLEGRA) 180 MG tablet Take 180 mg by mouth daily.   flecainide (TAMBOCOR) 100 MG tablet Take 1 tablet (  100 mg total) by mouth 2 (two) times daily.   glucosamine-chondroitin 500-400 MG tablet Take 1 tablet by mouth 2 (two) times daily.   IRON-VITAMIN C PO Take 1 tablet by mouth at bedtime.   Multiple Vitamins-Minerals (PRESERVISION AREDS) TABS Take by mouth 2 (two) times daily.   Omega-3 Fatty Acids (EQL OMEGA 3 FISH OIL) 1400 MG CAPS Take 1 capsule by mouth daily.   prednisoLONE acetate (PRED FORTE) 1 % ophthalmic suspension Apply 1 drop to eye 2 (two) times daily.   sodium chloride (OCEAN) 0.65 % nasal spray Place 1 spray into the nose as needed for congestion. Reported on 01/19/2016   trimethoprim-polymyxin b (POLYTRIM) ophthalmic solution    vitamin B-12 (CYANOCOBALAMIN) 1000 MCG tablet Take 1,000 mcg by mouth daily.   Zinc 50 MG TABS Take 50 mg by  mouth daily.     Allergies:   Codeine and Gabapentin   Social History   Socioeconomic History   Marital status: Married    Spouse name: Not on file   Number of children: 3   Years of education: Not on file   Highest education level: Not on file  Occupational History   Not on file  Tobacco Use   Smoking status: Never   Smokeless tobacco: Never  Vaping Use   Vaping Use: Never used  Substance and Sexual Activity   Alcohol use: Yes    Comment: socially   Drug use: No   Sexual activity: Never    Comment: menarche age 24, P65, first live birth age 35,  HRT x 30 years  Other Topics Concern   Not on file  Social History Narrative   2nd marriage.   First husband is deceased.   7 grandchildren   7 great grandchildren   Social Determinants of Radio broadcast assistant Strain: Not on file  Food Insecurity: Not on file  Transportation Needs: Not on file  Physical Activity: Not on file  Stress: Not on file  Social Connections: Not on file     Family History: The patient's family history includes Alzheimer's disease in her mother; Stroke in her father. There is no history of Heart attack.  ROS:   Please see the history of present illness.    Dysuria.  Otherwise no complaints.  All other systems reviewed and are negative.  EKGs/Labs/Other Studies Reviewed:    The following studies were reviewed today: Nuclear perfusion imaging performed August 2021: Study Highlights  Nuclear stress EF: 74%. The left ventricular ejection fraction is hyperdynamic (>65%). The patient walked for 7 minutes and 30 seconds of a standard Bruce protocol treadmill test. She achieved a peak heart rate of 115 which is 85% predicted maximal heart rate. At peak exercise there were no ST or T wave changes. This is a low risk study. There is no evidence of ischemia and no evidence of previous infarction. The study is normal.  EKG:  EKG sinus bradycardia with incomplete right bundle performed in  February 2022.  Recent Labs: No results found for requested labs within last 8760 hours.  Recent Lipid Panel No results found for: CHOL, TRIG, HDL, CHOLHDL, VLDL, LDLCALC, LDLDIRECT  Physical Exam:    VS:  BP 120/72   Ht '5\' 6"'$  (1.676 m)   Wt 154 lb 12.8 oz (70.2 kg)   SpO2 97%   BMI 24.99 kg/m     Wt Readings from Last 3 Encounters:  07/13/21 154 lb 12.8 oz (70.2 kg)  04/14/21 154 lb 6.4 oz (  70 kg)  01/23/21 158 lb 12.8 oz (72 kg)     GEN: Appears younger than stated age. No acute distress HEENT: Normal NECK: No JVD. LYMPHATICS: No lymphadenopathy CARDIAC: No murmur. RRR no gallop, or edema. VASCULAR:  Normal Pulses. No bruits. RESPIRATORY:  Clear to auscultation without rales, wheezing or rhonchi  ABDOMEN: Soft, non-tender, non-distended, No pulsatile mass, MUSCULOSKELETAL: No deformity  SKIN: Warm and dry NEUROLOGIC:  Alert and oriented x 3 PSYCHIATRIC:  Normal affect   ASSESSMENT:    1. Paroxysmal atrial fibrillation (HCC)   2. Essential hypertension   3. Chronic anticoagulation   4. TIA (transient ischemic attack)    PLAN:    In order of problems listed above:  Quiescent.  Tolerating therapy including Tambocor and diltiazem without complications. Excellent blood pressure control on current medications including diltiazem.   Continue Eliquis.  Notify for any significant bleeding. No neurological complaints.   Patient mention dysuria.  She plans a [place] trip.  I recommended that she call her primary care today to determine if she needs antibiotics before embarking on the 2-week trip this coming Saturday.   Plan clinical follow-up in 1 year.  Medication Adjustments/Labs and Tests Ordered: Current medicines are reviewed at length with the patient today.  Concerns regarding medicines are outlined above.  Orders Placed This Encounter  Procedures   CBC   Basic metabolic panel   No orders of the defined types were placed in this encounter.   Patient  Instructions  Medication Instructions:  Your physician recommends that you continue on your current medications as directed. Please refer to the Current Medication list given to you today.  *If you need a refill on your cardiac medications before your next appointment, please call your pharmacy*   Lab Work: BMET and CBC today  If you have labs (blood work) drawn today and your tests are completely normal, you will receive your results only by: [place] (if you have MyChart) OR A paper copy in the mail If you have any lab test that is abnormal or we need to change your treatment, we will call you to review the results.   Testing/Procedures: None   Follow-Up: At [facility], you and your health needs are our priority.  As part of our continuing mission to provide you with exceptional heart care, we have created designated Provider Care Teams.  These Care Teams include your primary Cardiologist (physician) and Advanced Practice Providers (APPs -  Physician Assistants and Nurse Practitioners) who all work together to provide you with the care you need, when you need it.  We recommend signing up for the patient portal called "MyChart".  Sign up information is provided on this After Visit Summary.  MyChart is used to connect with patients for Virtual Visits (Telemedicine).  Patients are able to view lab/test results, encounter notes, upcoming appointments, etc.  Non-urgent messages can be sent to your provider as well.   To learn more about what you can do with MyChart, go to [email or web address].    Your next appointment:   9-12 month(s)  The format for your next appointment:   In Person  Provider:   You may see Daneen Schick, MD or one of the following Advanced Practice Providers on your designated Care Team:   Cecilie Kicks, NP   Other Instructions     Signed, Sinclair Grooms, MD  07/13/2021 11:12 AM    [place]

## 2021-07-13 ENCOUNTER — Encounter: Payer: Self-pay | Admitting: Oncology

## 2021-07-13 ENCOUNTER — Other Ambulatory Visit: Payer: Self-pay

## 2021-07-13 ENCOUNTER — Ambulatory Visit (INDEPENDENT_AMBULATORY_CARE_PROVIDER_SITE_OTHER): Payer: Medicare Other | Admitting: Interventional Cardiology

## 2021-07-13 ENCOUNTER — Telehealth: Payer: Self-pay | Admitting: Family Medicine

## 2021-07-13 ENCOUNTER — Encounter: Payer: Self-pay | Admitting: Interventional Cardiology

## 2021-07-13 VITALS — BP 120/72 | Ht 66.0 in | Wt 154.8 lb

## 2021-07-13 DIAGNOSIS — G459 Transient cerebral ischemic attack, unspecified: Secondary | ICD-10-CM

## 2021-07-13 DIAGNOSIS — Z7901 Long term (current) use of anticoagulants: Secondary | ICD-10-CM

## 2021-07-13 DIAGNOSIS — I10 Essential (primary) hypertension: Secondary | ICD-10-CM | POA: Diagnosis not present

## 2021-07-13 DIAGNOSIS — I480 Paroxysmal atrial fibrillation: Secondary | ICD-10-CM

## 2021-07-13 LAB — CBC
Hematocrit: 35.6 % (ref 34.0–46.6)
Hemoglobin: 11.9 g/dL (ref 11.1–15.9)
MCH: 32 pg (ref 26.6–33.0)
MCHC: 33.4 g/dL (ref 31.5–35.7)
MCV: 96 fL (ref 79–97)
Platelets: 260 10*3/uL (ref 150–450)
RBC: 3.72 x10E6/uL — ABNORMAL LOW (ref 3.77–5.28)
RDW: 13 % (ref 11.7–15.4)
WBC: 7.4 10*3/uL (ref 3.4–10.8)

## 2021-07-13 LAB — BASIC METABOLIC PANEL
BUN/Creatinine Ratio: 22 (ref 12–28)
BUN: 22 mg/dL (ref 8–27)
CO2: 23 mmol/L (ref 20–29)
Calcium: 9.7 mg/dL (ref 8.7–10.3)
Chloride: 102 mmol/L (ref 96–106)
Creatinine, Ser: 0.99 mg/dL (ref 0.57–1.00)
Glucose: 93 mg/dL (ref 65–99)
Potassium: 4.7 mmol/L (ref 3.5–5.2)
Sodium: 140 mmol/L (ref 134–144)
eGFR: 56 mL/min/{1.73_m2} — ABNORMAL LOW (ref >59)

## 2021-07-13 NOTE — Patient Instructions (Signed)
Medication Instructions:  Your physician recommends that you continue on your current medications as directed. Please refer to the Current Medication list given to you today.  *If you need a refill on your cardiac medications before your next appointment, please call your pharmacy*   Lab Work: BMET and CBC today  If you have labs (blood work) drawn today and your tests are completely normal, you will receive your results only by: [place] (if you have MyChart) OR A paper copy in the mail If you have any lab test that is abnormal or we need to change your treatment, we will call you to review the results.   Testing/Procedures: None   Follow-Up: At [facility], you and your health needs are our priority.  As part of our continuing mission to provide you with exceptional heart care, we have created designated Provider Care Teams.  These Care Teams include your primary Cardiologist (physician) and Advanced Practice Providers (APPs -  Physician Assistants and Nurse Practitioners) who all work together to provide you with the care you need, when you need it.  We recommend signing up for the patient portal called "MyChart".  Sign up information is provided on this After Visit Summary.  MyChart is used to connect with patients for Virtual Visits (Telemedicine).  Patients are able to view lab/test results, encounter notes, upcoming appointments, etc.  Non-urgent messages can be sent to your provider as well.   To learn more about what you can do with MyChart, go to [email or web address].    Your next appointment:   9-12 month(s)  The format for your next appointment:   In Person  Provider:   You may see Daneen Schick, MD or one of the following Advanced Practice Providers on your designated Care Team:   Cecilie Kicks, NP   Other Instructions

## 2021-07-13 NOTE — Telephone Encounter (Signed)
Pt is wanting a refill on a script she was given recently for painful urination. She said it worked great before.  Her urine is cloudy and painful when voiding. She is leaving for [place] on 07/15/21 and needs this.  Pharmacy  [facility] [id number] - [address], [address] AT [facility] [address] [facility] [address] [address]  [place], [place] [address]  Phone:  [phone]  Fax:  [phone]  DEA #:  [id number]

## 2021-07-14 NOTE — Telephone Encounter (Signed)
Called patient and advised that she would need an appointment to get medication.  She states that she will just get something over the counter to take since she is going out of town for 2 weeks tomorrow.  Dm/cma

## 2021-07-31 ENCOUNTER — Ambulatory Visit: Payer: Medicare Other | Admitting: Family Medicine

## 2021-08-09 ENCOUNTER — Telehealth: Payer: Self-pay

## 2021-08-09 NOTE — Telephone Encounter (Signed)
Lft VM to rtn call to schedule an AWV appointment. Dm/cma

## 2021-08-16 ENCOUNTER — Encounter: Payer: Self-pay | Admitting: Family Medicine

## 2021-08-16 ENCOUNTER — Other Ambulatory Visit: Payer: Self-pay

## 2021-08-16 ENCOUNTER — Ambulatory Visit (INDEPENDENT_AMBULATORY_CARE_PROVIDER_SITE_OTHER): Payer: Medicare Other | Admitting: Family Medicine

## 2021-08-16 VITALS — BP 148/76 | HR 62 | Temp 97.0°F | Ht 66.0 in | Wt 155.8 lb

## 2021-08-16 DIAGNOSIS — M19041 Primary osteoarthritis, right hand: Secondary | ICD-10-CM | POA: Insufficient documentation

## 2021-08-16 DIAGNOSIS — I480 Paroxysmal atrial fibrillation: Secondary | ICD-10-CM | POA: Diagnosis not present

## 2021-08-16 DIAGNOSIS — M19042 Primary osteoarthritis, left hand: Secondary | ICD-10-CM

## 2021-08-16 DIAGNOSIS — I10 Essential (primary) hypertension: Secondary | ICD-10-CM

## 2021-08-16 DIAGNOSIS — Z23 Encounter for immunization: Secondary | ICD-10-CM

## 2021-08-16 DIAGNOSIS — E042 Nontoxic multinodular goiter: Secondary | ICD-10-CM

## 2021-08-16 LAB — TSH: TSH: 0.98 u[IU]/mL (ref 0.35–5.50)

## 2021-08-16 NOTE — Progress Notes (Signed)
[facility] [address] [place] [address] Dept: [phone] Dept Fax: [phone]  Chronic Care Office Visit  Subjective:    Patient ID: Katherine Powell, female    DOB: Jul 18, 1932, 87 y.o..   MRN: [id number]  Chief Complaint  Patient presents with   Follow-up    3 mo f/u HTN    History of Present Illness:  Patient is in today for reassessment of chronic medical issues.  Katherine Powell is doing well overall. She recently returned from a river cruise in [place]. She has a history of atrial fibrillation. She notes she had an episode of A fib near the end of her cruise, but this responded ot her flecainide and resolved. She saw Dr. Tamala Julian (cardiology) in early Aug. she continues on flecainide and Eliquis.   Katherine Powell has a history of hypertension. She is managed on diltiazem. She notes her blood pressure increases at times when she is stressed. She additionally notes some occasional tremor associated with this. She has not noted any resting tremor.   Katherine Powell has a history of a multiple thyroid nodules. She had FNA of these in 2018 that showed benign follicular nodules. She has noted more recently a sensation of pressure on her trachea, esp. when she raises her hands over her head. She also complains of a tickle in her throat, though she relates this to dry mouth issues. She does try to keep water handy while eating to assist with swallowing. When she gets the tickle in her throat, she massages the right neck.   Katherine Powell has occasional anxiety and is managed with PRN Xanax. She finds this help with her oaccisional tremor as well.  Past Medical History: Patient Active Problem List   Diagnosis Date Noted   Long term (current) use of anticoagulants 04/14/2021   Personal history of colonic polyps 04/14/2021   Mixed incontinence 04/14/2021   History of cornea transplant 04/14/2021   Essential hypertension 04/14/2021    Degeneration of lumbar intervertebral disc 04/14/2021   Corneal transplant failure, right eye 04/14/2021   Chronic kidney disease, stage 3a ([place]) 04/14/2021   Anxiety 04/14/2021   Allergic rhinitis 04/14/2021   Presbycusis of both ears 04/14/2021   Red blood cell antibody positive 12/05/2018   Multinodular goiter (nontoxic) 02/11/2017   Spinal stenosis of lumbar region 10/31/2016   Degenerative spondylolisthesis 10/11/2016   Osteopenia 07/18/2016   Lumbar radiculopathy 01/05/2015   Paroxysmal atrial fibrillation ([place]) 12/29/2014   Postmenopausal estrogen deficiency 01/27/2014   Malignant neoplasm of upper-outer quadrant of right breast in female, estrogen receptor positive ([place]) 11/17/2013   Past Surgical History:  Procedure Laterality Date   ABDOMINAL HYSTERECTOMY  1973   endometriosis   APPENDECTOMY  1954   BACK SURGERY     lumb-2005   BILATERAL OOPHORECTOMY     benign tumor on ovary   BREAST LUMPECTOMY WITH NEEDLE LOCALIZATION Right 11/09/2013   Procedure: BREAST LUMPECTOMY WITH NEEDLE LOCALIZATION;  Surgeon: Harl Bowie, MD;  Location: [facility];  Service: General;  Laterality: Right;   CATARACT EXTRACTION  2013   both   CYSTOCELE REPAIR  2008   EYE SURGERY     cataracts-plugs for eyes   RECTOCELE REPAIR  2008   TONSILLECTOMY     Family History  Problem Relation Age of Onset   Alzheimer's disease Mother    Stroke Father    Heart attack Neg Hx     Outpatient Medications Prior to  Visit  Medication Sig Dispense Refill   acetaminophen (TYLENOL) 650 MG CR tablet Take 650 mg by mouth every 8 (eight) hours as needed for pain. Reported on 01/19/2016     ALPRAZolam (XANAX) 0.5 MG tablet TAKE 1/2 TO 1 TABLET BY MOUTH TWICE DAILY AS NEEDED FOR 30 DAYS 30 tablet 1   apixaban (ELIQUIS) 5 MG TABS tablet TAKE 1 TABLET TWICE A DAY 180 tablet 1   cycloSPORINE (RESTASIS) 0.05 % ophthalmic emulsion 1 drop 2 (two) times daily.     diltiazem (CARTIA XT) 120 MG 24  hr capsule Take 1 capsule (120 mg total) by mouth every evening. 90 capsule 3   flecainide (TAMBOCOR) 100 MG tablet Take 1 tablet (100 mg total) by mouth 2 (two) times daily. 180 tablet 3   glucosamine-chondroitin 500-400 MG tablet Take 1 tablet by mouth 2 (two) times daily.     IRON-VITAMIN C PO Take 1 tablet by mouth at bedtime.     Multiple Vitamins-Minerals (PRESERVISION AREDS) TABS Take by mouth 2 (two) times daily.     prednisoLONE acetate (PRED FORTE) 1 % ophthalmic suspension Apply 1 drop to eye 2 (two) times daily.     Zinc 50 MG TABS Take 50 mg by mouth daily.     Calcium-Phosphorus-Vitamin D (CITRACAL +D3) 250-107-500 MG-MG-UNIT CHEW      diphenhydrAMINE (BENADRYL) 25 MG tablet Take 25 mg by mouth every 6 (six) hours as needed.     fexofenadine (ALLEGRA) 180 MG tablet Take 180 mg by mouth daily.     Omega-3 Fatty Acids (EQL OMEGA 3 FISH OIL) 1400 MG CAPS Take 1 capsule by mouth daily.     sodium chloride (OCEAN) 0.65 % nasal spray Place 1 spray into the nose as needed for congestion. Reported on 01/19/2016     trimethoprim-polymyxin b (POLYTRIM) ophthalmic solution      Ascorbic Acid (VITAMIN C) 1000 MG tablet 1 tablet (Patient not taking: Reported on 07/13/2021)     melatonin 5 MG TABS Take 5 mg by mouth at bedtime. (Patient not taking: Reported on 07/13/2021)     nitrofurantoin, macrocrystal-monohydrate, (MACROBID) 100 MG capsule Take 1 capsule (100 mg total) by mouth 2 (two) times daily. (Patient not taking: Reported on 07/13/2021) 20 capsule 0   vitamin B-12 (CYANOCOBALAMIN) 1000 MCG tablet Take 1,000 mcg by mouth daily.     No facility-administered medications prior to visit.   Allergies  Allergen Reactions   Codeine Nausea Only   Gabapentin Other (See Comments)    Makes her very sleepy    Objective:   Today's Vitals   08/16/21 0857  BP: (!) 148/76  Pulse: 62  Temp: (!) 97 F (36.1 C)  TempSrc: Temporal  SpO2: 97%  Weight: 155 lb 12.8 oz (70.7 kg)  Height: '5\' 6"'$   (1.676 m)   Body mass index is 25.15 kg/m.   General: Well developed, well nourished. No acute distress. CV: Regular rate and rhythm without murmur or rub. Pulses 2+ Extremities: No tremor noted at present. Marked nodular changes, esp. around the PIP joints of the hands. . Skin: Warm and dry. No rashes. Neuro:CN II-XII intact. Normal sensation and DTR bilaterally. Psych: Alert and oriented x3. Normal mood and affect.  There are no preventive care reminders to display for this patient.    Assessment & Plan:   1. Essential hypertension Ms. Vater's blood pressure is mildly high today. It had previously been in good control. We will continue her current therapy with diltiazem, and  monitor this. If it remains high, I would consider addition of a 2nd agent.  2. Paroxysmal atrial fibrillation (HCC) Stable. Continue flecainde and Eliquis.  3. Multinodular goiter (nontoxic) In light of Ms. Malta's increased symptoms, I will check a TSH and repeat her thyroid ultrasound to assess for any enlargement in her thyroid nodules.  - TSH - US THYROID; Future  4. Need for influenza vaccination  - Flu Vaccine QUAD High Dose(Fluad)  Haydee Salter, MD

## 2021-08-18 ENCOUNTER — Encounter: Payer: Self-pay | Admitting: Oncology

## 2021-08-18 ENCOUNTER — Ambulatory Visit
Admission: RE | Admit: 2021-08-18 | Discharge: 2021-08-18 | Disposition: A | Discharge status: Home / Self Care | Payer: Medicare Other | Source: Ambulatory Visit | Attending: Family Medicine | Admitting: Family Medicine

## 2021-08-18 DIAGNOSIS — E042 Nontoxic multinodular goiter: Secondary | ICD-10-CM

## 2021-08-18 NOTE — Telephone Encounter (Signed)
Called patient and she declines doing an AWV at this time. Dm/cma

## 2021-08-22 ENCOUNTER — Telehealth: Payer: Self-pay

## 2021-08-22 ENCOUNTER — Other Ambulatory Visit: Payer: Medicare Other

## 2021-08-22 ENCOUNTER — Other Ambulatory Visit: Payer: Self-pay

## 2021-08-22 NOTE — Telephone Encounter (Signed)
Called patient in regards to a urine she dropped off;  she was c/o because she was told she had CKD.  Then discussed her US thyroid results as well as scheduled her 3 month f/u from last office visit.  Dm/cma

## 2021-10-18 ENCOUNTER — Ambulatory Visit: Payer: Medicare Other | Admitting: Family Medicine

## 2021-10-31 ENCOUNTER — Encounter: Payer: Self-pay | Admitting: Oncology

## 2021-11-07 ENCOUNTER — Other Ambulatory Visit: Payer: Self-pay

## 2021-11-07 ENCOUNTER — Ambulatory Visit (INDEPENDENT_AMBULATORY_CARE_PROVIDER_SITE_OTHER): Payer: Medicare Other | Admitting: Family Medicine

## 2021-11-07 VITALS — BP 126/72 | HR 73 | Temp 97.0°F | Ht 66.0 in | Wt 157.6 lb

## 2021-11-07 DIAGNOSIS — I480 Paroxysmal atrial fibrillation: Secondary | ICD-10-CM | POA: Diagnosis not present

## 2021-11-07 DIAGNOSIS — I10 Essential (primary) hypertension: Secondary | ICD-10-CM | POA: Diagnosis not present

## 2021-11-07 DIAGNOSIS — E042 Nontoxic multinodular goiter: Secondary | ICD-10-CM

## 2021-11-07 NOTE — Progress Notes (Signed)
[facility] [address] [place] [address] Dept: [phone] Dept Fax: [phone]  Chronic Care Office Visit  Subjective:    Patient ID: Katherine Powell, female    DOB: 06-Mar-1932, 87 y.o..   MRN: [id number]  Chief Complaint  Patient presents with   Follow-up    3 month f/u.  C/o having hacking cough and shaky hands.      History of Present Illness:  Patient is in today for reassessment of chronic medical issues.  Ms. Bruening has a history of atrial fibrillation. She notes she has not had an episodes of tachycardia since her last visit.  She saw Dr. Tamala Julian (cardiology) in early Aug. She continues on flecainide and Eliquis.    Ms. Sollars has a history of hypertension. She is managed on diltiazem. She monitors her blood pressure at home and it has been in excellent control.   Ms. Hizer has a history of a multiple thyroid nodules. She had FNA of these in 2018 that showed benign follicular nodules. She continues to note occasional hacking cough, but admits that this may be acid reflux and allergies causing this.  Past Medical History: Patient Active Problem List   Diagnosis Date Noted   Osteoarthritis of both hands 08/16/2021   Long term (current) use of anticoagulants 04/14/2021   Personal history of colonic polyps 04/14/2021   Mixed incontinence 04/14/2021   History of cornea transplant 04/14/2021   Essential hypertension 04/14/2021   Degeneration of lumbar intervertebral disc 04/14/2021   Corneal transplant failure, right eye 04/14/2021   Chronic kidney disease, stage 3a ([place]) 04/14/2021   Anxiety 04/14/2021   Allergic rhinitis 04/14/2021   Presbycusis of both ears 04/14/2021   Red blood cell antibody positive 12/05/2018   Multinodular goiter (nontoxic) 02/11/2017   Spinal stenosis of lumbar region 10/31/2016   Degenerative spondylolisthesis 10/11/2016   Osteopenia 07/18/2016   Lumbar radiculopathy  01/05/2015   Paroxysmal atrial fibrillation ([place]) 12/29/2014   Postmenopausal estrogen deficiency 01/27/2014   Malignant neoplasm of upper-outer quadrant of right breast in female, estrogen receptor positive ([place]) 11/17/2013    Past Surgical History:  Procedure Laterality Date   ABDOMINAL HYSTERECTOMY  1973   endometriosis   APPENDECTOMY  1954   BACK SURGERY     lumb-2005   BILATERAL OOPHORECTOMY     benign tumor on ovary   BREAST LUMPECTOMY WITH NEEDLE LOCALIZATION Right 11/09/2013   Procedure: BREAST LUMPECTOMY WITH NEEDLE LOCALIZATION;  Surgeon: Harl Bowie, MD;  Location: [facility];  Service: General;  Laterality: Right;   CATARACT EXTRACTION  2013   both   CYSTOCELE REPAIR  2008   EYE SURGERY     cataracts-plugs for eyes   RECTOCELE REPAIR  2008   TONSILLECTOMY      Family History  Problem Relation Age of Onset   Alzheimer's disease Mother    Stroke Father    Heart attack Neg Hx     Outpatient Medications Prior to Visit  Medication Sig Dispense Refill   acetaminophen (TYLENOL) 650 MG CR tablet Take 650 mg by mouth every 8 (eight) hours as needed for pain. Reported on 01/19/2016     ALPRAZolam (XANAX) 0.5 MG tablet TAKE 1/2 TO 1 TABLET BY MOUTH TWICE DAILY AS NEEDED FOR 30 DAYS 30 tablet 1   apixaban (ELIQUIS) 5 MG TABS tablet TAKE 1 TABLET TWICE A DAY 180 tablet 1   Calcium-Phosphorus-Vitamin D (CITRACAL +D3) 250-107-500 MG-MG-UNIT CHEW  cycloSPORINE (RESTASIS) 0.05 % ophthalmic emulsion 1 drop 2 (two) times daily.     diltiazem (CARTIA XT) 120 MG 24 hr capsule Take 1 capsule (120 mg total) by mouth every evening. 90 capsule 3   diphenhydrAMINE (BENADRYL) 25 MG tablet Take 25 mg by mouth every 6 (six) hours as needed.     fexofenadine (ALLEGRA) 180 MG tablet Take 180 mg by mouth daily.     flecainide (TAMBOCOR) 100 MG tablet Take 1 tablet (100 mg total) by mouth 2 (two) times daily. 180 tablet 3   glucosamine-chondroitin 500-400 MG tablet Take  1 tablet by mouth 2 (two) times daily.     IRON-VITAMIN C PO Take 1 tablet by mouth at bedtime.     methocarbamol (ROBAXIN) 500 MG tablet TAKE 1 TABLET BY MOUTH EVERY 6 HOURS AS NEEDED FOR MUSCLE SPASMS OR TIGHTNESS     Multiple Vitamins-Minerals (PRESERVISION AREDS) TABS Take by mouth 2 (two) times daily.     Omega-3 Fatty Acids (EQL OMEGA 3 FISH OIL) 1400 MG CAPS Take 1 capsule by mouth daily.     prednisoLONE acetate (PRED FORTE) 1 % ophthalmic suspension Apply 1 drop to eye 2 (two) times daily.     sodium chloride (OCEAN) 0.65 % nasal spray Place 1 spray into the nose as needed for congestion. Reported on 01/19/2016     trimethoprim-polymyxin b (POLYTRIM) ophthalmic solution      Zinc 50 MG TABS Take 50 mg by mouth daily.     No facility-administered medications prior to visit.    Allergies  Allergen Reactions   Codeine Nausea Only   Gabapentin Other (See Comments)    Makes her very sleepy      Objective:   Today's Vitals   11/07/21 1031  BP: 126/72  Pulse: 73  Temp: (!) 97 F (36.1 C)  TempSrc: Temporal  SpO2: 98%  Weight: 157 lb 9.6 oz (71.5 kg)  Height: _0  (1.676 m)   Body mass index is 25.44 kg/m.   General: Well developed, well nourished. No acute distress. HEENT: Normocephalic, non-traumatic. External ears normal. EAC and TMs normal bilaterally. PERRL, EOMI. Conjunctiva clear. Fundiscopic exam shows normal disc and vasculature.Nose   clear without congestion or rhinorrhea. Mucous membranes moist. Oropharynx clear. Good dentition. Neck: Supple. No lymphadenopathy. No thyromegaly. Lungs: Clear to auscultation bilaterally. No wheezing, rales or rhonchi. CV: RRR without murmurs or rubs. Pulses 2+ bilaterally. Abdomen: Soft, non-tender. Bowel sounds positive, normal pitch and frequency. No hepatosplenomegaly. No rebound or guarding. Back: Straight. No CVA tenderness bilaterally. Extremities: Full ROM. No joint swelling or tenderness. No edema noted. Skin: Warm and  dry. No rashes. Neuro:CN II-XII intact. Normal sensation and DTR bilaterally. Psych: Alert and oriented x3. Normal mood and affect.  Health Maintenance Due  Topic Date Due   COVID-19 Vaccine (6 - Booster for Pfizer series) 05/08/2021   Imaging: Thyroid Ultrasound (08/18/2021) IMPRESSION: 1. Previously biopsied nodules in the left and right mid gland demonstrate no significant interval change compared to prior imaging from 2018. Recommend correlation with prior biopsy results. Presuming benign pathology, no further imaging follow-up would be recommended. 2. Diffusely enlarged, and heterogeneous thyroid gland.  Lab Results Last CBC Lab Results  Component Value Date   WBC 7.4 07/13/2021   HGB 11.9 07/13/2021   HCT 35.6 07/13/2021   MCV 96 07/13/2021   MCH 32.0 07/13/2021   RDW 13.0 07/13/2021   PLT 260 07/05/2335   Last metabolic panel Lab Results  Component Value Date  GLUCOSE 93 07/13/2021   NA 140 07/13/2021   K 4.7 07/13/2021   CL 102 07/13/2021   CO2 23 07/13/2021   BUN 22 07/13/2021   CREATININE 0.99 07/13/2021   EGFR 56 (L) 07/13/2021   CALCIUM 9.7 07/13/2021   PROT 6.6 01/26/2019   ALBUMIN 4.0 01/26/2019   BILITOT 0.4 01/26/2019   ALKPHOS 55 01/26/2019   AST 17 01/26/2019   ALT 13 01/26/2019   ANIONGAP 8 01/26/2019   Last thyroid functions Lab Results  Component Value Date   TSH 0.98 08/16/2021     Assessment & Plan:   1. Essential hypertension Blood pressure at goal. Continue diltiazem.  2. Paroxysmal atrial fibrillation (HCC) Well controlled. Continue flecainide, diltiazem, and Eliquis.  3. Multinodular goiter (nontoxic) Reviewed [place] results with Ms. Alfieri. No indication for further monitoring of this unless she develops symptoms.  Haydee Salter, MD

## 2021-11-13 ENCOUNTER — Other Ambulatory Visit: Payer: Self-pay | Admitting: Interventional Cardiology

## 2022-01-02 DIAGNOSIS — R030 Elevated blood-pressure reading, without diagnosis of hypertension: Secondary | ICD-10-CM | POA: Diagnosis not present

## 2022-01-02 DIAGNOSIS — M47816 Spondylosis without myelopathy or radiculopathy, lumbar region: Secondary | ICD-10-CM | POA: Diagnosis not present

## 2022-01-03 ENCOUNTER — Other Ambulatory Visit: Payer: Self-pay

## 2022-01-03 ENCOUNTER — Encounter: Payer: Self-pay | Admitting: Nurse Practitioner

## 2022-01-03 ENCOUNTER — Ambulatory Visit (INDEPENDENT_AMBULATORY_CARE_PROVIDER_SITE_OTHER): Payer: Medicare Other | Admitting: Nurse Practitioner

## 2022-01-03 ENCOUNTER — Telehealth: Payer: Self-pay | Admitting: *Deleted

## 2022-01-03 VITALS — BP 160/78 | HR 76 | Temp 96.8°F | Ht 66.0 in | Wt 156.6 lb

## 2022-01-03 DIAGNOSIS — J069 Acute upper respiratory infection, unspecified: Secondary | ICD-10-CM

## 2022-01-03 MED ORDER — BENZONATATE 100 MG PO CAPS: 100.0000 mg | ORAL_CAPSULE | Freq: Two times a day (BID) | ORAL | 0 refills | Status: DC | PRN

## 2022-01-03 MED ORDER — AMOXICILLIN-POT CLAVULANATE 875-125 MG PO TABS: 1.0000 | ORAL_TABLET | Freq: Two times a day (BID) | ORAL | 0 refills | Status: DC

## 2022-01-03 MED ORDER — PREDNISONE 10 MG PO TABS: ORAL_TABLET | ORAL | 0 refills | Status: DC

## 2022-01-03 NOTE — Telephone Encounter (Signed)
Patient appears to be on Eliquis instead of Xarelto.  They requested holding time for 7 days, however this appears to be too long.  We will ask our clinical pharmacist to review.

## 2022-01-03 NOTE — Patient Instructions (Signed)
It was great to see you!  Start prednisone taper, 6 tablets today, 5 tomorrow, and decrease by 1 every day until gone. Take in the morning with food.  Start augmentin twice a day for 10 days.  You can continue taking mucinex as needed. Start tesslon as needed for cough.   Follow up if your symptoms are not improving or any concerns.   Take care,  Vance Peper, NP

## 2022-01-03 NOTE — Telephone Encounter (Signed)
° °  Pre-operative Risk Assessment    Patient Name: Katherine Powell  DOB: 1932-02-12 MRN: [id number]      Request for Surgical Clearance    Procedure:   MEDIAL BRANCH BLOCK  Date of Surgery:  Clearance TBD                                 Surgeon:  DR. Marlaine Hind Surgeon's Group or Practice Name:  [facility] Phone number:  [phone] Fax number:  [phone]   Type of Clearance Requested:   - Medical  - Pharmacy:  Hold Rivaroxaban (Xarelto) x 7 DAYS PRIOR AND RESUME IMMEDIATELY S/P PROCEDURE   Type of Anesthesia:  Not Indicated   Additional requests/questions:    Jiles Prows   01/03/2022, 4:57 PM

## 2022-01-03 NOTE — Progress Notes (Signed)
Acute Office Visit  Subjective:    Patient ID: Katherine Powell, female    DOB: 06/16/1932, 88 y.o.   MRN: [id number]  Chief Complaint  Patient presents with   Nasal Congestion    C/o sinus pain started on 11/24/22, sporadic coughing, chest congestion, coughing up clear phlegm. Taking mucinex with no relief     HPI Patient is in today for chest congestion and coughing for 2 days. She got back from a cruise last Saturday. She started coughing on Monday. Then her symptoms stopped, then she started coughing again on Tuesday. Last night she noted rattling in her chest.   UPPER RESPIRATORY TRACT INFECTION  Fever: no Cough: yes - clear phlegm Shortness of breath: no Wheezing: no Chest pain: no Chest tightness: no Chest congestion: yes Nasal congestion: yes Runny nose: yes Post nasal drip: yes Sneezing: no Sore throat: yes - scratchy Swollen glands: no Sinus pressure: yes Headache: no Face pain: no Toothache: no Ear pain: no bilateral Ear pressure: no bilateral Eyes red/itching:no Eye drainage/crusting: no  Vomiting: no Rash: no Fatigue: yes Sick contacts: no Strep contacts: no  Context: fluctuating Recurrent sinusitis: no Relief with OTC cold/cough medications: no  Treatments attempted: mucinex, tylenol    Past Medical History:  Diagnosis Date   Acne rosacea 04/14/2021   Allergy    Anxiety    Arrhythmia    atrial fibrillation/  on    Arthritis    Atrial fibrillation (HCC)    Breast cancer (HCC) 11/09/13   right upper outer   Contact lens/glasses fitting    contact rt eye   Hx of radiation therapy 12/15/13- 01/11/14   right breast 4250 cGy in 17 sessions, (hypo-fractionated), no boost   Hypertension    Postmenopausal    Rosacea    Seasonal allergies    Sleep disturbance    Thyroid disease    Urinary incontinence     Past Surgical History:  Procedure Laterality Date   ABDOMINAL HYSTERECTOMY  1973   endometriosis   APPENDECTOMY  1954   BACK SURGERY      lumb-2005   BILATERAL OOPHORECTOMY     benign tumor on ovary   BREAST LUMPECTOMY WITH NEEDLE LOCALIZATION Right 11/09/2013   Procedure: BREAST LUMPECTOMY WITH NEEDLE LOCALIZATION;  Surgeon: [name], MD;  Location: [facility];  Service: General;  Laterality: Right;   CATARACT EXTRACTION  2013   both   CYSTOCELE REPAIR  2008   EYE SURGERY     cataracts-plugs for eyes   RECTOCELE REPAIR  2008   TONSILLECTOMY      Family History  Problem Relation Age of Onset   Alzheimer's disease Mother    Stroke Father    Heart attack Neg Hx     Social History   Socioeconomic History   Marital status: Married    Spouse name: Not on file   Number of children: 3   Years of education: Not on file   Highest education level: Not on file  Occupational History   Not on file  Tobacco Use   Smoking status: Never   Smokeless tobacco: Never  Vaping Use   Vaping Use: Never used  Substance and Sexual Activity   Alcohol use: Yes    Comment: socially   Drug use: No   Sexual activity: Never    Comment: menarche age 13, P54, first live birth age 71,  HRT x 30 years  Other Topics Concern   Not on file  Social History Narrative   2nd marriage.   First husband is deceased.   7 grandchildren   7 great grandchildren   Social Determinants of Radio broadcast assistant Strain: Not on file  Food Insecurity: Not on file  Transportation Needs: Not on file  Physical Activity: Not on file  Stress: Not on file  Social Connections: Not on file  Intimate Partner Violence: Not on file    Outpatient Medications Prior to Visit  Medication Sig Dispense Refill   acetaminophen (TYLENOL) 650 MG CR tablet Take 650 mg by mouth every 8 (eight) hours as needed for pain. Reported on 01/19/2016     ALPRAZolam (XANAX) 0.5 MG tablet TAKE 1/2 TO 1 TABLET BY MOUTH TWICE DAILY AS NEEDED FOR 30 DAYS 30 tablet 1   apixaban (ELIQUIS) 5 MG TABS tablet TAKE 1 TABLET TWICE A DAY 180 tablet 1    Calcium-Phosphorus-Vitamin D (CITRACAL +D3) 250-107-500 MG-MG-UNIT CHEW      cycloSPORINE (RESTASIS) 0.05 % ophthalmic emulsion 1 drop 2 (two) times daily.     diltiazem (CARDIZEM CD) 120 MG 24 hr capsule TAKE 1 CAPSULE EVERY EVENING 90 capsule 2   fexofenadine (ALLEGRA) 180 MG tablet Take 180 mg by mouth daily.     flecainide (TAMBOCOR) 100 MG tablet TAKE 1 TABLET TWICE A DAY 180 tablet 2   glucosamine-chondroitin 500-400 MG tablet Take 1 tablet by mouth 2 (two) times daily.     IRON-VITAMIN C PO Take 1 tablet by mouth at bedtime.     methocarbamol (ROBAXIN) 500 MG tablet TAKE 1 TABLET BY MOUTH EVERY 6 HOURS AS NEEDED FOR MUSCLE SPASMS OR TIGHTNESS     Multiple Vitamins-Minerals (PRESERVISION AREDS) TABS Take by mouth 2 (two) times daily.     Omega-3 Fatty Acids (EQL OMEGA 3 FISH OIL) 1400 MG CAPS Take 1 capsule by mouth daily.     prednisoLONE acetate (PRED FORTE) 1 % ophthalmic suspension Apply 1 drop to eye 2 (two) times daily.     sodium chloride (OCEAN) 0.65 % nasal spray Place 1 spray into the nose as needed for congestion. Reported on 01/19/2016     trimethoprim-polymyxin b (POLYTRIM) ophthalmic solution      Zinc 50 MG TABS Take 50 mg by mouth daily.     diphenhydrAMINE (BENADRYL) 25 MG tablet Take 25 mg by mouth every 6 (six) hours as needed.     No facility-administered medications prior to visit.    Allergies  Allergen Reactions   Codeine Nausea Only   Gabapentin Other (See Comments)    Makes her very sleepy    Review of Systems See pertinent positives and negatives per HPI.    Objective:    Physical Exam Vitals and nursing note reviewed.  Constitutional:      General: She is not in acute distress.    Appearance: Normal appearance.  HENT:     Head: Normocephalic.     Right Ear: Tympanic membrane, ear canal and external ear normal.     Left Ear: Tympanic membrane, ear canal and external ear normal.  Eyes:     Conjunctiva/sclera: Conjunctivae normal.   Cardiovascular:     Rate and Rhythm: Normal rate and regular rhythm.     Pulses: Normal pulses.     Heart sounds: Normal heart sounds.  Pulmonary:     Effort: Pulmonary effort is normal.     Breath sounds: Wheezing present.  Musculoskeletal:     Cervical back: Normal range of motion and neck  supple. No tenderness.  Lymphadenopathy:     Cervical: No cervical adenopathy.  Skin:    General: Skin is warm.  Neurological:     General: No focal deficit present.     Mental Status: She is alert and oriented to person, place, and time.  Psychiatric:        Mood and Affect: Mood normal.        Behavior: Behavior normal.        Thought Content: Thought content normal.        Judgment: Judgment normal.    BP (!) 160/78    Pulse 76    Temp (!) 96.8 F (36 C)    Ht $R'5\' 6"'gm$  (1.676 m)    Wt 156 lb 9.6 oz (71 kg)    SpO2 96%    BMI 25.28 kg/m  Wt Readings from Last 3 Encounters:  01/03/22 156 lb 9.6 oz (71 kg)  11/07/21 157 lb 9.6 oz (71.5 kg)  08/16/21 155 lb 12.8 oz (70.7 kg)    Health Maintenance Due  Topic Date Due   COVID-19 Vaccine (6 - Booster for Pfizer series) 05/08/2021    There are no preventive care reminders to display for this patient.   Lab Results  Component Value Date   TSH 0.98 08/16/2021   Lab Results  Component Value Date   WBC 7.4 07/13/2021   HGB 11.9 07/13/2021   HCT 35.6 07/13/2021   MCV 96 07/13/2021   PLT 260 07/13/2021   Lab Results  Component Value Date   NA 140 07/13/2021   K 4.7 07/13/2021   CHLORIDE 106 07/25/2017   CO2 23 07/13/2021   GLUCOSE 93 07/13/2021   BUN 22 07/13/2021   CREATININE 0.99 07/13/2021   BILITOT 0.4 01/26/2019   ALKPHOS 55 01/26/2019   AST 17 01/26/2019   ALT 13 01/26/2019   PROT 6.6 01/26/2019   ALBUMIN 4.0 01/26/2019   CALCIUM 9.7 07/13/2021   ANIONGAP 8 01/26/2019   EGFR 56 (L) 07/13/2021   No results found for: CHOL No results found for: HDL No results found for: LDLCALC No results found for: TRIG No  results found for: CHOLHDL No results found for: HGBA1C     Assessment & Plan:   Problem List Items Addressed This Visit   None Visit Diagnoses     Upper respiratory tract infection, unspecified type    -  Primary   Check covid-19, flu, rsv with recent cruise. Start prednsione taper and augmentin BID x10 days. Encourage rest, fluids. Tessalon and mucinex prn.    Relevant Orders   COVID-19, Flu A+B and RSV        Meds ordered this encounter  Medications   DISCONTD: amoxicillin-clavulanate (AUGMENTIN) 875-125 MG tablet    Sig: Take 1 tablet by mouth 2 (two) times daily.    Dispense:  20 tablet    Refill:  0   DISCONTD: benzonatate (TESSALON) 100 MG capsule    Sig: Take 1 capsule (100 mg total) by mouth 2 (two) times daily as needed for cough.    Dispense:  20 capsule    Refill:  0   DISCONTD: predniSONE (DELTASONE) 10 MG tablet    Sig: Take 6 tablets today, then 5 tablets tomorrow, then decrease by 1 tablet every day until gone    Dispense:  21 tablet    Refill:  0   amoxicillin-clavulanate (AUGMENTIN) 875-125 MG tablet    Sig: Take 1 tablet by mouth 2 (two) times daily.  Dispense:  20 tablet    Refill:  0   benzonatate (TESSALON) 100 MG capsule    Sig: Take 1 capsule (100 mg total) by mouth 2 (two) times daily as needed for cough.    Dispense:  20 capsule    Refill:  0   predniSONE (DELTASONE) 10 MG tablet    Sig: Take 6 tablets today, then 5 tablets tomorrow, then decrease by 1 tablet every day until gone    Dispense:  21 tablet    Refill:  0     Charyl Dancer, NP

## 2022-01-04 ENCOUNTER — Other Ambulatory Visit: Payer: Self-pay | Admitting: Nurse Practitioner

## 2022-01-04 DIAGNOSIS — J069 Acute upper respiratory infection, unspecified: Secondary | ICD-10-CM

## 2022-01-04 LAB — COVID-19, FLU A+B AND RSV

## 2022-01-04 NOTE — Progress Notes (Signed)
New order placed for covid, flu, RSV swab.

## 2022-01-04 NOTE — Telephone Encounter (Signed)
Patient with diagnosis of afib on Eliquis for anticoagulation.    Procedure: medial branch block Date of procedure: TBD  CHA2DS2-VASc Score = 6  This indicates a 9.7% annual risk of stroke. The patient's score is based upon: CHF History: 0 HTN History: 1 Diabetes History: 0 Stroke History: 2 (TIA) Vascular Disease History: 0 Age Score: 2 Gender Score: 1   CrCl 54mL/min Platelet count 223K  Agree 7 day DOAC hold is too long. Per office protocol, patient can hold Eliquis for 3 days prior to procedure. She should resume as soon as safely possible after given her elevated CV risk.

## 2022-01-04 NOTE — Telephone Encounter (Signed)
° °  Name: Katherine Powell  DOB: [birth year]  MRN: [id number]   Primary Cardiologist: None  Chart reviewed as part of pre-operative protocol coverage. Patient was contacted 01/04/2022 in reference to pre-operative risk assessment for pending surgery as outlined below.  Katherine Powell was last seen on 07/13/2021 by Dr. Daneen Schick.  Since that day, Katherine Powell has done well without chest pain or worsening dyspnea.  She has not noticed any recurrence of atrial fibrillation.  Therefore, based on ACC/AHA guidelines, the patient would be at acceptable risk for the planned procedure without further cardiovascular testing.   Our clinical pharmacist recommend holding Eliquis for 3 days prior to the procedure and restart as soon as possible afterward at the surgeon's discretion.  The patient was advised that if she develops new symptoms prior to surgery to contact our office to arrange for a follow-up visit, and she verbalized understanding.  I will route this recommendation to the requesting party via Epic fax function and remove from pre-op pool. Please call with questions.  [place], [place] 01/04/2022, 9:59 AM

## 2022-01-04 NOTE — Addendum Note (Signed)
Addended by: Adora Fridge on: 01/04/2022 04:44 PM   Modules accepted: Orders

## 2022-01-05 ENCOUNTER — Telehealth: Payer: Self-pay | Admitting: Family Medicine

## 2022-01-05 NOTE — Telephone Encounter (Signed)
I attempted to call patient and left a voice mail. She can decrease the prednisone to 1 tablet once a day for 5 days total.

## 2022-01-05 NOTE — Telephone Encounter (Signed)
Pt is wanting a call back concerning her concerns about taking predniSONE (DELTASONE) 10 MG tablet [[id number]]. She feels this is making her face red and she is out of town. She is wondering what she should do. Please advise pt at [phone]   she was seen by Lauren on 01/03/22.

## 2022-01-05 NOTE — Telephone Encounter (Signed)
Called pat back, pt stated redness in her face since starting prednisone, would like to know if dosage could be cut in half or an alternative sent in. Sw, cma

## 2022-01-06 LAB — COVID-19, FLU A+B AND RSV
Influenza A, NAA: NOT DETECTED
Influenza B, NAA: NOT DETECTED
RSV, NAA: NOT DETECTED
SARS-CoV-2, NAA: NOT DETECTED

## 2022-01-08 DIAGNOSIS — H179 Unspecified corneal scar and opacity: Secondary | ICD-10-CM | POA: Diagnosis not present

## 2022-01-08 DIAGNOSIS — Z947 Corneal transplant status: Secondary | ICD-10-CM | POA: Diagnosis not present

## 2022-01-08 DIAGNOSIS — H52213 Irregular astigmatism, bilateral: Secondary | ICD-10-CM | POA: Diagnosis not present

## 2022-01-08 NOTE — Telephone Encounter (Signed)
Called and lvm. Sw,cma ?

## 2022-01-08 NOTE — Progress Notes (Signed)
Called and left detailed vm. Sw, cma

## 2022-01-11 ENCOUNTER — Encounter: Payer: Self-pay | Admitting: Oncology

## 2022-01-11 ENCOUNTER — Other Ambulatory Visit: Payer: Self-pay

## 2022-01-11 ENCOUNTER — Telehealth: Payer: Self-pay | Admitting: Interventional Cardiology

## 2022-01-11 ENCOUNTER — Emergency Department (HOSPITAL_COMMUNITY): Payer: Medicare Other

## 2022-01-11 ENCOUNTER — Encounter (HOSPITAL_COMMUNITY): Payer: Self-pay | Admitting: Emergency Medicine

## 2022-01-11 ENCOUNTER — Emergency Department (HOSPITAL_COMMUNITY)
Admission: EM | Admit: 2022-01-11 | Discharge: 2022-01-11 | Disposition: A | Discharge status: Home / Self Care | Payer: Medicare Other | Attending: Emergency Medicine | Admitting: Emergency Medicine

## 2022-01-11 DIAGNOSIS — T887XXA Unspecified adverse effect of drug or medicament, initial encounter: Secondary | ICD-10-CM | POA: Diagnosis not present

## 2022-01-11 DIAGNOSIS — Z7901 Long term (current) use of anticoagulants: Secondary | ICD-10-CM | POA: Diagnosis not present

## 2022-01-11 DIAGNOSIS — I4891 Unspecified atrial fibrillation: Secondary | ICD-10-CM | POA: Diagnosis not present

## 2022-01-11 DIAGNOSIS — R918 Other nonspecific abnormal finding of lung field: Secondary | ICD-10-CM | POA: Diagnosis not present

## 2022-01-11 DIAGNOSIS — I10 Essential (primary) hypertension: Secondary | ICD-10-CM | POA: Insufficient documentation

## 2022-01-11 DIAGNOSIS — E860 Dehydration: Secondary | ICD-10-CM | POA: Diagnosis not present

## 2022-01-11 DIAGNOSIS — R42 Dizziness and giddiness: Secondary | ICD-10-CM | POA: Diagnosis not present

## 2022-01-11 DIAGNOSIS — R000 Tachycardia, unspecified: Secondary | ICD-10-CM | POA: Diagnosis not present

## 2022-01-11 DIAGNOSIS — Z7952 Long term (current) use of systemic steroids: Secondary | ICD-10-CM | POA: Diagnosis not present

## 2022-01-11 DIAGNOSIS — T50904A Poisoning by unspecified drugs, medicaments and biological substances, undetermined, initial encounter: Secondary | ICD-10-CM | POA: Diagnosis not present

## 2022-01-11 LAB — CBC WITH DIFFERENTIAL/PLATELET
Abs Immature Granulocytes: 0.15 10*3/uL — ABNORMAL HIGH (ref 0.00–0.07)
Basophils Absolute: 0 10*3/uL (ref 0.0–0.1)
Basophils Relative: 0 %
Eosinophils Absolute: 0.2 10*3/uL (ref 0.0–0.5)
Eosinophils Relative: 3 %
HCT: 39.8 % (ref 36.0–46.0)
Hemoglobin: 13 g/dL (ref 12.0–15.0)
Immature Granulocytes: 2 %
Lymphocytes Relative: 22 %
Lymphs Abs: 1.6 10*3/uL (ref 0.7–4.0)
MCH: 31.6 pg (ref 26.0–34.0)
MCHC: 32.7 g/dL (ref 30.0–36.0)
MCV: 96.8 fL (ref 80.0–100.0)
Monocytes Absolute: 0.8 10*3/uL (ref 0.1–1.0)
Monocytes Relative: 10 %
Neutro Abs: 4.6 10*3/uL (ref 1.7–7.7)
Neutrophils Relative %: 63 %
Platelets: 279 10*3/uL (ref 150–400)
RBC: 4.11 MIL/uL (ref 3.87–5.11)
RDW: 13.8 % (ref 11.5–15.5)
WBC: 7.4 10*3/uL (ref 4.0–10.5)
nRBC: 0 % (ref 0.0–0.2)

## 2022-01-11 LAB — COMPREHENSIVE METABOLIC PANEL
ALT: 18 U/L (ref 0–44)
AST: 17 U/L (ref 15–41)
Albumin: 3.4 g/dL — ABNORMAL LOW (ref 3.5–5.0)
Alkaline Phosphatase: 68 U/L (ref 38–126)
Anion gap: 6 (ref 5–15)
BUN: 28 mg/dL — ABNORMAL HIGH (ref 8–23)
CO2: 25 mmol/L (ref 22–32)
Calcium: 8.8 mg/dL — ABNORMAL LOW (ref 8.9–10.3)
Chloride: 99 mmol/L (ref 98–111)
Creatinine, Ser: 1.4 mg/dL — ABNORMAL HIGH (ref 0.44–1.00)
GFR, Estimated: 36 mL/min — ABNORMAL LOW (ref >60)
Glucose, Bld: 113 mg/dL — ABNORMAL HIGH (ref 70–99)
Potassium: 5.2 mmol/L — ABNORMAL HIGH (ref 3.5–5.1)
Sodium: 130 mmol/L — ABNORMAL LOW (ref 135–145)
Total Bilirubin: 0.6 mg/dL (ref 0.3–1.2)
Total Protein: 5.8 g/dL — ABNORMAL LOW (ref 6.5–8.1)

## 2022-01-11 LAB — TROPONIN I (HIGH SENSITIVITY): Troponin I (High Sensitivity): 4 ng/L (ref <18)

## 2022-01-11 LAB — BRAIN NATRIURETIC PEPTIDE: B Natriuretic Peptide: 256.4 pg/mL — ABNORMAL HIGH (ref 0.0–100.0)

## 2022-01-11 LAB — MAGNESIUM: Magnesium: 2.1 mg/dL (ref 1.7–2.4)

## 2022-01-11 MED ORDER — SODIUM CHLORIDE 0.9 % IV SOLN: INTRAVENOUS | Status: DC | PRN

## 2022-01-11 MED ORDER — DILTIAZEM HCL 60 MG PO TABS
60.0000 mg | ORAL_TABLET | Freq: Once | ORAL | Status: AC
  Administered 2022-01-11: 60 mg via ORAL
  Filled 2022-01-11: qty 1

## 2022-01-11 MED ORDER — DILTIAZEM HCL 25 MG/5ML IV SOLN
20.0000 mg | Freq: Once | INTRAVENOUS | Status: AC
Start: 2022-01-11 — End: 2022-01-11
  Administered 2022-01-11: 20 mg via INTRAVENOUS
  Filled 2022-01-11: qty 5

## 2022-01-11 MED ORDER — DILTIAZEM HCL ER COATED BEADS 180 MG PO CP24: 180.0000 mg | ORAL_CAPSULE | Freq: Every day | ORAL | 0 refills | Status: DC

## 2022-01-11 MED ORDER — SODIUM CHLORIDE 0.9 % IV BOLUS
500.0000 mL | Freq: Once | INTRAVENOUS | Status: AC
  Administered 2022-01-11: 500 mL via INTRAVENOUS

## 2022-01-11 MED ORDER — LEVOFLOXACIN 750 MG PO TABS: 750.0000 mg | ORAL_TABLET | ORAL | 0 refills | Status: DC

## 2022-01-11 MED ORDER — LEVOFLOXACIN 750 MG PO TABS: 750.0000 mg | ORAL_TABLET | Freq: Every day | ORAL | 0 refills | Status: DC

## 2022-01-11 NOTE — Telephone Encounter (Signed)
Spoke with pt and inquired about the number of Flecainide pills she took yesterday and today.  Pt states she took 4 total yesterday and took 2 first thing this morning.  Lightheadedness and dizziness with her AF.  HR was 107 yesterday.  Currently still feels like she is in AF.  Advised pt she needed to hang up with me and call 911 immediately so that she can go to the hospital because she has taken too much Flecainide and there is concern for cardiac issues with the amount she has taken in 24 hours.  Pt verbalized understanding and was in agreement with plan.

## 2022-01-11 NOTE — ED Triage Notes (Signed)
Pt here from home with c/o taking too much of her flicainide ( approx  600mg  over the last 24 hrs ) pt found to be in afib by ems and had a rub of Vtach , pt arrived to the ED alert and oriented

## 2022-01-11 NOTE — Telephone Encounter (Signed)
Patient c/o Palpitations:  High priority if patient c/o lightheadedness, shortness of breath, or chest pain  How long have you had palpitations/irregular HR/ Afib? Since yesterday morning  Are you having the symptoms now? Yes  Are you currently experiencing lightheadedness, SOB or CP? Lightheaded/dizzy  Do you have a history of afib (atrial fibrillation) or irregular heart rhythm? yes  Have you checked your BP or HR? (document readings if available):  HR 107 yesterday evening. Patient has not checked it today   Are you experiencing any other symptoms? No  Patient took extra flecainide yesterday and today. She said the extra medication usually helps but today it didn't   She said she hasn't had this happen since last year

## 2022-01-11 NOTE — Discharge Instructions (Addendum)
You have been seen and discharged from the emergency department.  Your blood work showed mild dehydration, most likely from your recent illness.  Chest X ray shows findings of possible pneumonia. You were given IV fluids, IV Cardizem and oral Cardizem.  I consulted with cardiology, they recommend that we increase your daily Cardizem dose, new prescription has been sent.  You are now to take 180 mg once a day and follow-up with cardiology. Stop your Augmentin, and take Levaquin for respiratory infection. You are to take 750 mg of Levaquin every other day starting today 01/11/22. Follow-up with your primary provider for further evaluation and further care. Take home medications as prescribed. If you have any worsening symptoms or further concerns for your health please return to an emergency department for further evaluation.

## 2022-01-11 NOTE — ED Provider Notes (Addendum)
[facility] EMERGENCY DEPARTMENT Provider Note   CSN: [id number] Arrival date & time: 01/11/22  1028     History  Chief Complaint  Patient presents with   Atrial Fibrillation    Katherine Powell is a 88 y.o. female.  HPI  88 year old female with past medical history of atrial fibrillation anticoagulated on Eliquis, HTN presents to the emergency department with concern for fast heart rate/palpitations.  Patient states that she takes her flecainide medication twice a day.  When she feels like her heart is beating too fast she takes an additional dose.  Yesterday she states she took 3 additional doses in the morning, 300 mg total and then took her night dose 100 mg.  When she woke up this morning the heart was still racing so she took an additional 2 pills of flecainide, 200 mg total.  This did not subside her symptoms so she came in for evaluation.  She denies any chest pain, back pain, shortness of breath.  No swelling of her lower extremities.  Did recently just suffer with a flulike illness, is currently on an antibiotic for presumed upper respiratory infection.  Denies any GI symptoms.  States she is otherwise been compliant with her medications.  Home Medications Prior to Admission medications   Medication Sig Start Date End Date Taking? Authorizing Provider  acetaminophen (TYLENOL) 650 MG CR tablet Take 650 mg by mouth every 8 (eight) hours as needed for pain. Reported on 01/19/2016    [provider]  ALPRAZolam Duanne Moron) 0.5 MG tablet TAKE 1/2 TO 1 TABLET BY MOUTH TWICE DAILY AS NEEDED FOR 30 DAYS 05/24/21   Haydee Salter, MD  amoxicillin-clavulanate (AUGMENTIN) 875-125 MG tablet Take 1 tablet by mouth 2 (two) times daily. 01/03/22   McElwee, Scheryl Darter, NP  apixaban (ELIQUIS) 5 MG TABS tablet TAKE 1 TABLET TWICE A DAY 05/18/21   Belva Crome, MD  benzonatate (TESSALON) 100 MG capsule Take 1 capsule (100 mg total) by mouth 2 (two) times daily as needed for  cough. 01/03/22   McElwee, Scheryl Darter, NP  Calcium-Phosphorus-Vitamin D (CITRACAL +D3) 119-147-829 MG-MG-UNIT CHEW     [provider]  cycloSPORINE (RESTASIS) 0.05 % ophthalmic emulsion 1 drop 2 (two) times daily.    [provider]  diltiazem (CARDIZEM CD) 120 MG 24 hr capsule TAKE 1 CAPSULE EVERY EVENING 11/14/21   Belva Crome, MD  diphenhydrAMINE (BENADRYL) 25 MG tablet Take 25 mg by mouth every 6 (six) hours as needed.    [provider]  fexofenadine (ALLEGRA) 180 MG tablet Take 180 mg by mouth daily.    [provider]  flecainide (TAMBOCOR) 100 MG tablet TAKE 1 TABLET TWICE A DAY 11/14/21   Belva Crome, MD  glucosamine-chondroitin 500-400 MG tablet Take 1 tablet by mouth 2 (two) times daily.    [provider]  IRON-VITAMIN C PO Take 1 tablet by mouth at bedtime.    [provider]  methocarbamol (ROBAXIN) 500 MG tablet TAKE 1 TABLET BY MOUTH EVERY 6 HOURS AS NEEDED FOR MUSCLE SPASMS OR TIGHTNESS 10/06/21   [provider]  Multiple Vitamins-Minerals (PRESERVISION AREDS) TABS Take by mouth 2 (two) times daily.    [provider]  Omega-3 Fatty Acids (EQL OMEGA 3 FISH OIL) 1400 MG CAPS Take 1 capsule by mouth daily.    [provider]  prednisoLONE acetate (PRED FORTE) 1 % ophthalmic suspension Apply 1 drop to eye 2 (two) times daily. 11/04/19  [provider]  predniSONE (DELTASONE) 10 MG tablet Take 6 tablets today, then 5 tablets tomorrow, then decrease by 1 tablet every day until gone 01/03/22   McElwee, Lauren A, NP  sodium chloride (OCEAN) 0.65 % nasal spray Place 1 spray into the nose as needed for congestion. Reported on 01/19/2016    [provider]  trimethoprim-polymyxin b (POLYTRIM) ophthalmic solution  10/06/19   [provider]  Zinc 50 MG TABS Take 50 mg by mouth daily.    [provider]      Allergies    Codeine and Gabapentin    Review of Systems   Review  of Systems  Constitutional:  Positive for fatigue. Negative for fever.  Respiratory:  Negative for chest tightness and shortness of breath.   Cardiovascular:  Positive for palpitations. Negative for chest pain and leg swelling.  Gastrointestinal:  Negative for abdominal pain, diarrhea and vomiting.  Skin:  Negative for rash.  Neurological:  Negative for headaches.   Physical Exam Updated Vital Signs BP 125/84    Pulse (!) 120    Temp 98.7 F (37.1 C)    Resp 18    SpO2 98%  Physical Exam Vitals and nursing note reviewed.  Constitutional:      General: She is not in acute distress.    Appearance: Normal appearance. She is not diaphoretic.  HENT:     Head: Normocephalic.     Mouth/Throat:     Mouth: Mucous membranes are moist.  Cardiovascular:     Rate and Rhythm: Tachycardia present. Rhythm irregular.  Pulmonary:     Effort: Pulmonary effort is normal. No respiratory distress.     Breath sounds: No rales.  Abdominal:     Palpations: Abdomen is soft.     Tenderness: There is no abdominal tenderness.  Musculoskeletal:        General: No swelling.  Skin:    General: Skin is warm.  Neurological:     Mental Status: She is alert and oriented to person, place, and time. Mental status is at baseline.  Psychiatric:        Mood and Affect: Mood normal.    ED Results / Procedures / Treatments   Labs (all labs ordered are listed, but only abnormal results are displayed) Labs Reviewed  CBC WITH DIFFERENTIAL/PLATELET - Abnormal; Notable for the following components:      Result Value   Abs Immature Granulocytes 0.15 (*)    All other components within normal limits  COMPREHENSIVE METABOLIC PANEL - Abnormal; Notable for the following components:   Sodium 130 (*)    Potassium 5.2 (*)    Glucose, Bld 113 (*)    BUN 28 (*)    Creatinine, Ser 1.40 (*)    Calcium 8.8 (*)    Total Protein 5.8 (*)    Albumin 3.4 (*)    GFR, Estimated 36 (*)    All other components within normal  limits  BRAIN NATRIURETIC PEPTIDE - Abnormal; Notable for the following components:   B Natriuretic Peptide 256.4 (*)    All other components within normal limits  MAGNESIUM  TROPONIN I (HIGH SENSITIVITY)  TROPONIN I (HIGH SENSITIVITY)    EKG EKG Interpretation  Date/Time:  Thursday January 11 2022 10:33:29 EST Ventricular Rate:  111 PR Interval:    QRS Duration: 140 QT Interval:  369 QTC Calculation: 502 R Axis:   183 Text Interpretation: Atrial fibrillation Right bundle branch block ST depression, consider ischemia, diffuse lds Confirmed  by Lavenia Atlas [phone]) on 01/11/2022 12:33:37 PM  Radiology DG Chest Port 1 View  Result Date: 01/11/2022 CLINICAL DATA:  Atrial fibrillation, dizziness EXAM: PORTABLE CHEST 1 VIEW COMPARISON:  Portable exam 1055 hours compared to 02/06/2007 FINDINGS: Normal heart size, mediastinal contours, and pulmonary vascularity. Hazy infiltrates bilaterally question multifocal infection. No pleural effusion or pneumothorax. Bones demineralized. IMPRESSION: Hazy BILATERAL pulmonary infiltrates question multifocal infection. Electronically Signed   By: Lavonia Dana M.D.   On: 01/11/2022 11:43    Procedures .Critical Care Performed by: Lorelle Gibbs, DO Authorized by: Lorelle Gibbs, DO   Critical care provider statement:    Critical care time (minutes):  45   Critical care time was exclusive of:  Separately billable procedures and treating other patients   Critical care was necessary to treat or prevent imminent or life-threatening deterioration of the following conditions:  Circulatory failure and dehydration   Critical care was time spent personally by me on the following activities:  Development of treatment plan with patient or surrogate, discussions with consultants, evaluation of patient's response to treatment, examination of patient, ordering and review of laboratory studies, ordering and review of radiographic studies, ordering and performing  treatments and interventions, pulse oximetry, re-evaluation of patient's condition and review of old charts    Medications Ordered in ED Medications  diltiazem (CARDIZEM) injection 20 mg (has no administration in time range)  0.9 %  sodium chloride infusion (has no administration in time range)    ED Course/ Medical Decision Making/ A&P                           Medical Decision Making Amount and/or Complexity of Data Reviewed Labs: ordered. Radiology: ordered.  Risk Prescription drug management.   This patient presents to the ED for concern of palpitations/racing heartbeat, this involves an extensive number of treatment options, and is a complaint that carries with it a high risk of complications and morbidity.  The differential diagnosis includes arrhythmia, atrial fibrillation RVR, ACS, PE   Additional history obtained: -Additional history obtained from husband at bedside -External records from outside source obtained and reviewed including: Chart review including previous notes, labs, imaging, consultation notes   Lab Tests: -I ordered, reviewed, and interpreted labs.  The pertinent results include: Mild AKI, mild hyperkalemia, normal troponin   EKG -Atrial fibrillation, tachycardia   Imaging Studies ordered: -I ordered imaging studies including chest x-ray -I independently visualized and interpreted imaging which showed bilateral hazy findings, possible multifocal pneumonia -I agree with the radiologist interpretation   Medicines ordered and prescription drug management: -I ordered medication including IV Cardizem, IV fluids, p.o. Cardizem for rate control and dehydration -Reevaluation of the patient after these medicines showed that the patient improved -I have reviewed the patients home medicines and have made adjustments as needed   Consultations Obtained: I requested consultation with the cardiology, Dr. Johney Frame,  and discussed lab and imaging findings as  well as pertinent plan - they recommend: Increasing her nightly Cardizem dose from 120 mg to 180 mg   ED Course: 88 year old female presents emergency department with palpitations/racing heartbeat.  Not resolved with taking additional home doses of her flecainide.  She is tachycardic on arrival, heart rates in the 120s, EKG shows atrial fibrillation without any other acute causes.  She is otherwise asymptomatic.  Stable blood pressure.  No chest pain, shortness of breath or back pain.  No swelling of her lower extremities or findings  of CHF.  Lung sounds are clear.  Chest x-ray shows possible multifocal pneumonia.  Patient suffered from a flulike illness last week, now has nonproductive cough, has been taking Augmentin for sinus/upper respiratory infection.  We will escalate these antibiotics to cover for respiratory infection/community-acquired pneumonia.  Due to her kidney function recommendation is Levaquin 750 mg every other day for 5 doses.  No signs of sepsis or severe infection.  No hypoxia or indication for emergent admission.  Low suspicion for PE given that she is compliant and anticoagulated on Eliquis.  Blood work shows mild AKI.  Most likely mild dehydration secondary to flulike illness last week, she is still currently on antibiotics for upper respiratory infection.  After IV fluids and IV Cardizem her rates have been controlled for the last 2 hours.  Blood pressure remained stable.  Consulted cardiology who recommends increasing her Cardizem.  Patient feels well and prefers to go home.  Patient will follow-up as an outpatient with cardiology.   Critical Interventions: IV fluids, IV Cardizem   Cardiac Monitoring: The patient was maintained on a cardiac monitor.  I personally viewed and interpreted the cardiac monitored which showed an underlying rhythm of: Atrial fibrillation   Reevaluation: After the interventions noted above, I reevaluated the patient and found that they have  :improved   Dispostion: Patient at this time appears safe and stable for discharge and close outpatient follow up. Discharge plan and strict return to ED precautions discussed, patient verbalizes understanding and agreement.        Final Clinical Impression(s) / ED Diagnoses Final diagnoses:  None    Rx / DC Orders ED Discharge Orders     None         Lorelle Gibbs, DO 01/11/22 1451    Latrecia Capito, Alvin Critchley, DO 01/11/22 1507

## 2022-01-12 NOTE — Telephone Encounter (Signed)
Patient went to the ED as advised by the RN. She wanted to let the office know that some of her medications were changed while she was at the ED.  She was told by the ED to follow up with Dr. Thompson Caul office.  Patient feels much better now

## 2022-01-15 NOTE — Telephone Encounter (Signed)
Left message to call back  

## 2022-01-15 NOTE — Telephone Encounter (Signed)
Spoke with pt and made her aware of recommendations.  She was agreeable to schedule appt at AF clinic for tomorrow at 3pm.  She will call back once she gets home if that time doesn't work for her.  She already has the parking code because her husband has appt Thursday.  Advised to call back as soon as she can if unable to come tomorrow so they can get spot filled.  Pt agreeable.

## 2022-01-16 ENCOUNTER — Ambulatory Visit (HOSPITAL_COMMUNITY): Payer: Medicare Other | Admitting: Nurse Practitioner

## 2022-01-16 ENCOUNTER — Encounter (HOSPITAL_COMMUNITY): Payer: Self-pay

## 2022-01-16 DIAGNOSIS — Z1231 Encounter for screening mammogram for malignant neoplasm of breast: Secondary | ICD-10-CM | POA: Diagnosis not present

## 2022-01-16 LAB — HM MAMMOGRAPHY

## 2022-01-18 ENCOUNTER — Encounter (HOSPITAL_COMMUNITY): Payer: Self-pay | Admitting: Nurse Practitioner

## 2022-01-18 ENCOUNTER — Encounter: Payer: Self-pay | Admitting: Family Medicine

## 2022-01-18 ENCOUNTER — Ambulatory Visit (HOSPITAL_COMMUNITY)
Admission: RE | Admit: 2022-01-18 | Discharge: 2022-01-18 | Disposition: A | Discharge status: Home / Self Care | Payer: Medicare Other | Source: Ambulatory Visit | Attending: Nurse Practitioner | Admitting: Nurse Practitioner

## 2022-01-18 ENCOUNTER — Other Ambulatory Visit: Payer: Self-pay

## 2022-01-18 VITALS — BP 148/70 | HR 56 | Ht 66.0 in | Wt 162.2 lb

## 2022-01-18 DIAGNOSIS — Z7901 Long term (current) use of anticoagulants: Secondary | ICD-10-CM | POA: Insufficient documentation

## 2022-01-18 DIAGNOSIS — D6869 Other thrombophilia: Secondary | ICD-10-CM

## 2022-01-18 DIAGNOSIS — I4891 Unspecified atrial fibrillation: Secondary | ICD-10-CM | POA: Diagnosis not present

## 2022-01-18 DIAGNOSIS — I480 Paroxysmal atrial fibrillation: Secondary | ICD-10-CM

## 2022-01-18 DIAGNOSIS — Z79899 Other long term (current) drug therapy: Secondary | ICD-10-CM | POA: Insufficient documentation

## 2022-01-18 MED ORDER — DILTIAZEM HCL 30 MG PO TABS: ORAL_TABLET | ORAL | 1 refills | Status: DC

## 2022-01-18 NOTE — Progress Notes (Addendum)
Primary Care Physician: Haydee Salter, MD Referring Physician:ED f/u Cardiology: Dr. Lina Sar Katherine is a 88 y.o. female with a h/o afib on flecainide that is in the afib clinic as a f/u from the ED. She went into aafib in the setiing of the FUl. Prior to going to the ED , she had taken 3 extra  100 mg flecainide one day and 200 mg extra flecainide the next day. She was felt to have CAP and was started on Levaquin and rates were slowed down to d/c home. Cardizem was increased to 180 mg daily.  She was asked to follow up in the afib clinic.   Today, she is in SR. She is feeling improved. We discussed how to treat afib if it reoccurs and taking extra flecainide is not recommended and can be dangerous.   Today, she denies symptoms of palpitations, chest pain, shortness of breath, orthopnea, PND, lower extremity edema, dizziness, presyncope, syncope, or neurologic sequela. The patient is tolerating medications without difficulties and is otherwise without complaint today.   Past Medical History:  Diagnosis Date   Acne rosacea 04/14/2021   Allergy    Anxiety    Arrhythmia    atrial fibrillation/  on    Arthritis    Atrial fibrillation ([place])    Breast cancer ([place]) 11/09/13   right upper outer   Contact lens/glasses fitting    contact rt eye   Hx of radiation therapy 12/15/13- 01/11/14   right breast 4250 cGy in 17 sessions, (hypo-fractionated), no boost   Hypertension    Postmenopausal    Rosacea    Seasonal allergies    Sleep disturbance    Thyroid disease    Urinary incontinence    Past Surgical History:  Procedure Laterality Date   ABDOMINAL HYSTERECTOMY  1973   endometriosis   APPENDECTOMY  1954   BACK SURGERY     lumb-2005   BILATERAL OOPHORECTOMY     benign tumor on ovary   BREAST LUMPECTOMY WITH NEEDLE LOCALIZATION Right 11/09/2013   Procedure: BREAST LUMPECTOMY WITH NEEDLE LOCALIZATION;  Surgeon: Harl Bowie, MD;  Location: [place];   Service: General;  Laterality: Right;   CATARACT EXTRACTION  2013   both   CYSTOCELE REPAIR  2008   EYE SURGERY     cataracts-plugs for eyes   RECTOCELE REPAIR  2008   TONSILLECTOMY      Current Outpatient Medications  Medication Sig Dispense Refill   acetaminophen (TYLENOL) 650 MG CR tablet Take 650 mg by mouth every 8 (eight) hours as needed for pain. Reported on 01/19/2016     ALPRAZolam (XANAX) 0.5 MG tablet TAKE 1/2 TO 1 TABLET BY MOUTH TWICE DAILY AS NEEDED FOR 30 DAYS (Patient taking differently: Take 0.25 mg by mouth 2 (two) times daily as needed for anxiety.) 30 tablet 1   apixaban (ELIQUIS) 5 MG TABS tablet TAKE 1 TABLET TWICE A DAY (Patient taking differently: Take 5 mg by mouth 2 (two) times daily.) 180 tablet 1   B Complex-C (SUPER B COMPLEX PO) Take 1 tablet by mouth every morning.     cycloSPORINE (RESTASIS) 0.05 % ophthalmic emulsion Place 1 drop into both eyes 2 (two) times daily as needed (dry eyes).     diltiazem (CARDIZEM CD) 180 MG 24 hr capsule Take 1 capsule (180 mg total) by mouth daily. 30 capsule 0   fexofenadine (ALLEGRA) 180 MG tablet Take 180 mg by mouth daily as  needed (seasonal allergies).     flecainide (TAMBOCOR) 100 MG tablet TAKE 1 TABLET TWICE A DAY 180 tablet 2   Glucosamine HCl-MSM (GLUCOSAMINE-MSM PO) Take 1 tablet by mouth 2 (two) times daily.     melatonin 5 MG TABS Take 5 mg by mouth at bedtime as needed (sleep).     Multiple Vitamins-Minerals (HAIR/SKIN/NAILS/BIOTIN) TABS Take 1 tablet by mouth 2 (two) times daily.     Multiple Vitamins-Minerals (PRESERVISION AREDS) TABS Take 1 tablet by mouth 2 (two) times daily.     OVER THE COUNTER MEDICATION Take 1 tablet by mouth every morning. Iron/Vitamin C/ niacin 125-40-3     OVER THE COUNTER MEDICATION Take 1 tablet by mouth at bedtime as needed (sleep). Sleep3 - melatonin plus herbals     prednisoLONE acetate (PRED FORTE) 1 % ophthalmic suspension Place 1 drop into the right eye 2 (two) times daily.      sodium chloride (OCEAN) 0.65 % nasal spray Place 1 spray into the nose 2 (two) times daily as needed for congestion. Reported on 01/19/2016     Zinc 50 MG TABS Take 50 mg by mouth at bedtime.     Omega-3 Fatty Acids (FISH OIL) 1200 MG CAPS Take by mouth. (Patient not taking: Reported on 01/18/2022)     No current facility-administered medications for this encounter.    Allergies  Allergen Reactions   Codeine Nausea Only   Gabapentin Other (See Comments)    Makes her very sleepy    Social History   Socioeconomic History   Marital status: Married    Spouse name: Not on file   Number of children: 3   Years of education: Not on file   Highest education level: Not on file  Occupational History   Not on file  Tobacco Use   Smoking status: Never   Smokeless tobacco: Never  Vaping Use   Vaping Use: Never used  Substance and Sexual Activity   Alcohol use: Yes    Comment: socially   Drug use: No   Sexual activity: Never    Comment: menarche age 6, P54, first live birth age 35,  HRT x 30 years  Other Topics Concern   Not on file  Social History Narrative   2nd marriage.   First husband is deceased.   7 grandchildren   7 great grandchildren   Social Determinants of Health   Financial Resource Strain: Not on file  Food Insecurity: Not on file  Transportation Needs: Not on file  Physical Activity: Not on file  Stress: Not on file  Social Connections: Not on file  Intimate Partner Violence: Not on file    Family History  Problem Relation Age of Onset   Alzheimer's disease Mother    Stroke Father    Heart attack Neg Hx     ROS- All systems are reviewed and negative except as per the HPI above  Physical Exam: Vitals:   01/18/22 1355  Pulse: (!) 56  Weight: 73.6 kg  Height: 5\' 6"  (1.676 m)   Wt Readings from Last 3 Encounters:  01/18/22 73.6 kg  01/03/22 71 kg  11/07/21 71.5 kg    Labs: Lab Results  Component Value Date   NA 130 (L) 01/11/2022   K 5.2 (H)  01/11/2022   CL 99 01/11/2022   CO2 25 01/11/2022   GLUCOSE 113 (H) 01/11/2022   BUN 28 (H) 01/11/2022   CREATININE 1.40 (H) 01/11/2022   CALCIUM 8.8 (L) 01/11/2022  MG 2.1 01/11/2022   Lab Results  Component Value Date   INR 1.05 11/09/2013   No results found for: CHOL, HDL, LDLCALC, TRIG   GEN- The patient is well appearing, alert and oriented x 3 today.   Head- normocephalic, atraumatic Eyes-  Sclera clear, conjunctiva pink Ears- hearing intact Oropharynx- clear Neck- supple, no JVP Lymph- no cervical lymphadenopathy Lungs- Clear to ausculation bilaterally, normal work of breathing Heart- Regular rate and rhythm, no murmurs, rubs or gallops, PMI not laterally displaced GI- soft, NT, ND, + BS Extremities- no clubbing, cyanosis, or edema MS- no significant deformity or atrophy Skin- no rash or lesion Psych- euthymic mood, full affect Neuro- strength and sensation are intact  EKG-PR interval 192 ms QRS duration 100 ms QT/QTcB 422/407 ms P-R-T axes 75 30 80 Sinus bradycardia Low voltage QRS Borderline ECG When compared with ECG of 11-Jan-2022 10:33,    Assessment and Plan:  1. Afib  Recent breakthrough of  afib  in the setting of the flu and CAP  Now back in rhythm  Continue Cardizem 180 mg daily  Discussed how flecainide  was not to be increased if in afib.  Continue flecainide 100 mg bid  I will give her 30 mg Cardizem to use if needed for breakthrough afib wih instructions in use  Continue eliquis 5 mg bid for a CHA2DS2VASc  score of 4 She is pending a back injection in the near future and has been notified of the timof she needs to be off anticoagulation.  F/u with Dr. Tamala Julian as scheduled Afib clinic as needed   Geroge Baseman. Tamekia Rotter, [facility] [address] [place], [place] [address] [phone]

## 2022-01-18 NOTE — Patient Instructions (Signed)
Cardizem 30mg  -- take 1 tablet every 4 hours AS NEEDED for heart rate >100 as long as top number of blood pressure >100.      When in afib -- take your normal doses of Flecainide as scheduled. Do NOT take any extra doses of flecainide.  Use as needed cardizem 30mg  tablets for afib

## 2022-01-19 ENCOUNTER — Other Ambulatory Visit: Payer: Self-pay

## 2022-01-19 MED ORDER — DILTIAZEM HCL ER COATED BEADS 180 MG PO CP24: 180.0000 mg | ORAL_CAPSULE | Freq: Every day | ORAL | 7 refills | Status: DC

## 2022-01-19 NOTE — Telephone Encounter (Signed)
Pt's medication was sent to pt's pharmacy as requested. Confirmation received.  °

## 2022-01-23 ENCOUNTER — Telehealth: Payer: Self-pay | Admitting: Interventional Cardiology

## 2022-01-23 NOTE — Telephone Encounter (Signed)
°*  STAT* If patient is at the pharmacy, call can be transferred to refill team.   1. Which medications need to be refilled? (please list name of each medication and dose if known) diltiazem (CARDIZEM CD) 180 MG 24 hr capsule  2. Which pharmacy/location (including street and city if local pharmacy) is medication to be sent to?[id number] [address], [address] AT [facility] [address]  Phone:  [phone] Fax:  [phone]   3. Do they need a 30 day or 90 day supply? 30 ds

## 2022-01-23 NOTE — Telephone Encounter (Signed)
Pt's medication was already sent to pt's pharmacy as requested. Confirmation received.  

## 2022-01-24 DIAGNOSIS — M47816 Spondylosis without myelopathy or radiculopathy, lumbar region: Secondary | ICD-10-CM | POA: Diagnosis not present

## 2022-01-31 ENCOUNTER — Telehealth: Payer: Self-pay | Admitting: *Deleted

## 2022-01-31 NOTE — Telephone Encounter (Signed)
? ?  Pre-operative Risk Assessment  ?  ?Patient Name: Katherine Powell  ?DOB: 08/21/1932 ?MRN: [id number]  ? ?  ? ?Request for Surgical Clearance   ? ?Procedure:   SPINAL INJECTION ? ?Date of Surgery:  Clearance TBD  URGENT PER CLEARANCE REQUEST                           ?   ?Surgeon:  DR. Marlaine Hind ?Surgeon's Group or Practice Name:  [facility] ?Phone number:  [phone] ?Fax number:  [phone] ?  ?Type of Clearance Requested:   ?- Medical  ?- Pharmacy:  Hold Apixaban (Eliquis) x 3 DAYS PRIOR ?  ?Type of Anesthesia:  None  ?  ?Additional requests/questions:   ? ?Signed, ?Julaine Hua   ?01/31/2022, 5:03 PM  ? ?

## 2022-02-01 NOTE — Telephone Encounter (Signed)
Patient with diagnosis of atrial fibrillation on Eliquis for anticoagulation.   ? ?Procedure: spinal injection ?Date of procedure: TBD ? ? ?CHA2DS2-VASc Score = 6  ? This indicates a 9.7% annual risk of stroke. ?The patient's score is based upon: ?CHF History: 0 ?HTN History: 1 ?Diabetes History: 0 ?Stroke History: 2 (TIA) ?Vascular Disease History: 0 ?Age Score: 2 ?Gender Score: 1 ?  ? ?CrCl 29 ?Platelet count 279 ? ?Per office protocol, patient can hold Eliquis for 3 days prior to procedure.   ?Patient will not need bridging with Lovenox (enoxaparin) around procedure. ? ?Per chart TIA was questionable - had word finding difficulty summer 2019, workup negative for stroke.  Please be sure to start Eliquis as soon as safely possible.   ? ?

## 2022-02-02 NOTE — Telephone Encounter (Signed)
? ?  Primary Cardiologist: Sinclair Grooms, MD ? ?Chart reviewed as part of pre-operative protocol coverage. Given past medical history and time since last visit, based on ACC/AHA guidelines, Katherine Powell would be at acceptable risk for the planned procedure without further cardiovascular testing.  ? ?Patient with diagnosis of atrial fibrillation on Eliquis for anticoagulation.   ?  ?Procedure: spinal injection ?Date of procedure: TBD ?  ?  ?CHA2DS2-VASc Score = 6  ? This indicates a 9.7% annual risk of stroke. ?The patient's score is based upon: ?CHF History: 0 ?HTN History: 1 ?Diabetes History: 0 ?Stroke History: 2 (TIA) ?Vascular Disease History: 0 ?Age Score: 2 ?Gender Score: 1 ?  ?  ?CrCl 29 ?Platelet count 279 ?  ?Per office protocol, patient can hold Eliquis for 3 days prior to procedure.   ?Patient will not need bridging with Lovenox (enoxaparin) around procedure. ?  ?Per chart TIA was questionable - had word finding difficulty summer 2019, workup negative for stroke.  Please be sure to start Eliquis as soon as safely possible. ? ?I will route this recommendation to the requesting party via Epic fax function and remove from pre-op pool. ? ?Please call with questions. ? ?Jossie Ng. Markcus Lazenby NP-C ? ?  ?02/02/2022, 7:32 AM ?[facility] ?[address] ?Office [phone] Fax [phone] ? ? ? ? ?

## 2022-02-04 ENCOUNTER — Other Ambulatory Visit: Payer: Self-pay | Admitting: Interventional Cardiology

## 2022-02-04 DIAGNOSIS — I480 Paroxysmal atrial fibrillation: Secondary | ICD-10-CM

## 2022-02-05 NOTE — Telephone Encounter (Signed)
Eliquis '5mg'$  refill request received. Patient is 88 years old, weight-73.6kg, Crea-1.40 on 01/11/2022, Diagnosis-Afib, and last seen by Roderic Palau on 01/18/2022. Dose is appropriate based on dosing criteria. Will send in refill to requested pharmacy.   ?

## 2022-02-07 ENCOUNTER — Other Ambulatory Visit: Payer: Self-pay

## 2022-02-07 MED ORDER — DILTIAZEM HCL ER COATED BEADS 180 MG PO CP24: 180.0000 mg | ORAL_CAPSULE | Freq: Every day | ORAL | 1 refills | Status: DC

## 2022-02-13 ENCOUNTER — Other Ambulatory Visit: Payer: Self-pay

## 2022-02-13 ENCOUNTER — Ambulatory Visit (INDEPENDENT_AMBULATORY_CARE_PROVIDER_SITE_OTHER): Payer: Medicare Other | Admitting: Family Medicine

## 2022-02-13 VITALS — BP 130/78 | HR 62 | Temp 97.7°F | Ht 66.0 in | Wt 158.2 lb

## 2022-02-13 DIAGNOSIS — I480 Paroxysmal atrial fibrillation: Secondary | ICD-10-CM | POA: Diagnosis not present

## 2022-02-13 DIAGNOSIS — M5416 Radiculopathy, lumbar region: Secondary | ICD-10-CM | POA: Diagnosis not present

## 2022-02-13 DIAGNOSIS — I10 Essential (primary) hypertension: Secondary | ICD-10-CM

## 2022-02-13 DIAGNOSIS — F419 Anxiety disorder, unspecified: Secondary | ICD-10-CM | POA: Diagnosis not present

## 2022-02-13 DIAGNOSIS — M47816 Spondylosis without myelopathy or radiculopathy, lumbar region: Secondary | ICD-10-CM | POA: Insufficient documentation

## 2022-02-13 LAB — COMPREHENSIVE METABOLIC PANEL
ALT: 14 U/L (ref 0–35)
AST: 20 U/L (ref 0–37)
Albumin: 4.5 g/dL (ref 3.5–5.2)
Alkaline Phosphatase: 78 U/L (ref 39–117)
BUN: 22 mg/dL (ref 6–23)
CO2: 29 mEq/L (ref 19–32)
Calcium: 9.6 mg/dL (ref 8.4–10.5)
Chloride: 99 mEq/L (ref 96–112)
Creatinine, Ser: 1.07 mg/dL (ref 0.40–1.20)
GFR: 46.71 mL/min — ABNORMAL LOW (ref >60.00)
Glucose, Bld: 93 mg/dL (ref 70–99)
Potassium: 4.1 mEq/L (ref 3.5–5.1)
Sodium: 134 mEq/L — ABNORMAL LOW (ref 135–145)
Total Bilirubin: 0.5 mg/dL (ref 0.2–1.2)
Total Protein: 6.7 g/dL (ref 6.0–8.3)

## 2022-02-13 MED ORDER — VENLAFAXINE HCL ER 37.5 MG PO CP24: 37.5000 mg | ORAL_CAPSULE | Freq: Every day | ORAL | 11 refills | Status: DC

## 2022-02-13 NOTE — Progress Notes (Signed)
?[facility] ?[facility] ?[place] ?[place] [address] ?Dept: [phone] ?Dept Fax: [phone] ? ?Chronic Care Office Visit ? ?Subjective:  ? ? Patient ID: Katherine Powell, female    DOB: 04-Feb-1932, 88 y.o..   MRN: [id number] ? ?Chief Complaint  ?Patient presents with  ? Follow-up  ?  6 month f/u.  Not fasting today.     ? ? ?History of Present Illness: ? ?Patient is in today for reassessment of chronic medical issues. ? ?Katherine Powell has a history of atrial fibrillation. She was seen at [facility] ED about a month ago with palpitations. Her pulse was mildly accelerated at 120.  Her diltiazem dose was increased to 180 mg daily. She continues on flecainide and Eliquis. She was seen in the cardiology clinic the following week and recommended to continue with the increased diltiazem dose. She was given an additional 30 mg diltiazem to use if she got an increased rate again. ?  ?Katherine Powell has a history of hypertension. She is managed on diltiazem. She monitors her blood pressure at home and it has been in good control overall. ? ?Katherine Powell has a history of anxiety. She takes a xanax about once a week. However, she feels her anxiety is with her most days. She wonders about being on a medication to help with this. ? ?Past Medical History: ?Patient Active Problem List  ? Diagnosis Date Noted  ? Arthropathy of lumbar facet joint 02/13/2022  ? Osteoarthritis of both hands 08/16/2021  ? Long term (current) use of anticoagulants 04/14/2021  ? Personal history of colonic polyps 04/14/2021  ? Mixed incontinence 04/14/2021  ? History of cornea transplant 04/14/2021  ? Essential hypertension 04/14/2021  ? Degeneration of lumbar intervertebral disc 04/14/2021  ? Corneal transplant failure, right eye 04/14/2021  ? Chronic kidney disease, stage 3a ([place]) 04/14/2021  ? Anxiety 04/14/2021  ? Allergic rhinitis 04/14/2021  ? Presbycusis of both ears 04/14/2021  ? Red blood cell  antibody positive 12/05/2018  ? Multinodular goiter (nontoxic) 02/11/2017  ? Spinal stenosis of lumbar region 10/31/2016  ? Degenerative spondylolisthesis 10/11/2016  ? Osteopenia 07/18/2016  ? Lumbar radiculopathy 01/05/2015  ? Paroxysmal atrial fibrillation ([place]) 12/29/2014  ? Postmenopausal estrogen deficiency 01/27/2014  ? Malignant neoplasm of upper-outer quadrant of right breast in female, estrogen receptor positive ([place]) 11/17/2013  ? ?Past Surgical History:  ?Procedure Laterality Date  ? ABDOMINAL HYSTERECTOMY  1973  ? endometriosis  ? APPENDECTOMY  1954  ? BACK SURGERY    ? lumb-2005  ? BILATERAL OOPHORECTOMY    ? benign tumor on ovary  ? BREAST LUMPECTOMY WITH NEEDLE LOCALIZATION Right 11/09/2013  ? Procedure: BREAST LUMPECTOMY WITH NEEDLE LOCALIZATION;  Surgeon: Harl Bowie, MD;  Location: [facility];  Service: General;  Laterality: Right;  ? CATARACT EXTRACTION  2013  ? both  ? CYSTOCELE REPAIR  2008  ? EYE SURGERY    ? cataracts-plugs for eyes  ? RECTOCELE REPAIR  2008  ? TONSILLECTOMY    ? ?Family History  ?Problem Relation Age of Onset  ? Alzheimer's disease Mother   ? Stroke Father   ? Heart attack Neg Hx   ? ?Outpatient Medications Prior to Visit  ?Medication Sig Dispense Refill  ? acetaminophen (TYLENOL) 650 MG CR tablet Take 650 mg by mouth every 8 (eight) hours as needed for pain. Reported on 01/19/2016    ? ALPRAZolam (XANAX) 0.5 MG tablet TAKE 1/2 TO 1 TABLET BY MOUTH TWICE  DAILY AS NEEDED FOR 30 DAYS (Patient taking differently: Take 0.25 mg by mouth 2 (two) times daily as needed for anxiety.) 30 tablet 1  ? apixaban (ELIQUIS) 5 MG TABS tablet TAKE 1 TABLET TWICE A DAY 180 tablet 3  ? B Complex-C (SUPER B COMPLEX PO) Take 1 tablet by mouth every morning.    ? cycloSPORINE (RESTASIS) 0.05 % ophthalmic emulsion Place 1 drop into both eyes 2 (two) times daily as needed (dry eyes).    ? diltiazem (CARDIZEM CD) 180 MG 24 hr capsule Take 1 capsule (180 mg total) by mouth daily.  90 capsule 1  ? diltiazem (CARDIZEM) 30 MG tablet Take 1 tablet every 4 hours AS NEEDED for heart rate >100 30 tablet 1  ? fexofenadine (ALLEGRA) 180 MG tablet Take 180 mg by mouth daily as needed (seasonal allergies).    ? flecainide (TAMBOCOR) 100 MG tablet TAKE 1 TABLET TWICE A DAY 180 tablet 2  ? Glucosamine HCl-MSM (GLUCOSAMINE-MSM PO) Take 1 tablet by mouth 2 (two) times daily.    ? melatonin 5 MG TABS Take 5 mg by mouth at bedtime as needed (sleep).    ? Multiple Vitamins-Minerals (HAIR/SKIN/NAILS/BIOTIN) TABS Take 1 tablet by mouth 2 (two) times daily.    ? Multiple Vitamins-Minerals (PRESERVISION AREDS) TABS Take 1 tablet by mouth 2 (two) times daily.    ? Omega-3 Fatty Acids (FISH OIL) 1200 MG CAPS Take by mouth.    ? OVER THE COUNTER MEDICATION Take 1 tablet by mouth every morning. Iron/Vitamin C/ niacin 125-40-3    ? OVER THE COUNTER MEDICATION Take 1 tablet by mouth at bedtime as needed (sleep). Sleep3 - melatonin plus herbals    ? prednisoLONE acetate (PRED FORTE) 1 % ophthalmic suspension Place 1 drop into the right eye 2 (two) times daily.    ? sodium chloride (OCEAN) 0.65 % nasal spray Place 1 spray into the nose 2 (two) times daily as needed for congestion. Reported on 01/19/2016    ? Zinc 50 MG TABS Take 50 mg by mouth at bedtime.    ? ?No facility-administered medications prior to visit.  ? ?Allergies  ?Allergen Reactions  ? Codeine Nausea Only  ? Gabapentin Other (See Comments)  ?  Makes her very sleepy  ?  ?Objective:  ? ?Today's Vitals  ? 02/13/22 1057  ?BP: 130/78  ?Pulse: 62  ?Temp: 97.7 ?F (36.5 ?C)  ?TempSrc: Temporal  ?SpO2: 96%  ?Weight: 158 lb 3.2 oz (71.8 kg)  ?Height: '5\' 6"'$  (1.676 m)  ? ?Body mass index is 25.53 kg/m?.  ? ?General: Well developed, well nourished. No acute distress. ?Psych: Alert and oriented. Normal mood and affect. ? ?There are no preventive care reminders to display for this patient. ?   ?Lab Results: ?CMP Latest Ref Rng & Units 01/11/2022 07/13/2021 06/21/2020  ?Glucose  70 - 99 mg/dL 113(H) 93 159(H)  ?BUN 8 - 23 mg/dL 28(H) 22 20  ?Creatinine 0.44 - 1.00 mg/dL 1.40(H) 0.99 1.07(H)  ?Sodium 135 - 145 mmol/L 130(L) 140 138  ?Potassium 3.5 - 5.1 mmol/L 5.2(H) 4.7 4.2  ?Chloride 98 - 111 mmol/L 99 102 101  ?CO2 22 - 32 mmol/L '25 23 25  '$ ?Calcium 8.9 - 10.3 mg/dL 8.8(L) 9.7 9.5  ?Total Protein 6.5 - 8.1 g/dL 5.8(L) - -  ?Total Bilirubin 0.3 - 1.2 mg/dL 0.6 - -  ?Alkaline Phos 38 - 126 U/L 68 - -  ?AST 15 - 41 U/L 17 - -  ?ALT 0 -  44 U/L 18 - -  ? ?Assessment & Plan:  ? ?1. Essential hypertension ?Blood pressure is at goal. Home blood pressures are averaging 133/73. Katherine Powell will continue her diltiazem 180 mg daily. ? ?- Comprehensive metabolic panel ? ?2. Paroxysmal atrial fibrillation (HCC) ?Reviewed ED note and cardiology consult note. Rate well controlled currently. There were multiple abnormal results on her CMP in the ED. I suspect she may have been a bit dehydrated. I will reassess these today. ? ?- Comprehensive metabolic panel ? ?3. Lumbar radiculopathy ?Recent ESIs. Has another planned this week. ? ?4. Anxiety ?We will start a SNRI at a low dose. I will plant o reassess this in 6 weeks. ? ?- venlafaxine XR (EFFEXOR XR) 37.5 MG 24 hr capsule; Take 1 capsule (37.5 mg total) by mouth daily with breakfast.  Dispense: 30 capsule; Refill: 11 ? ? ?Return in about 6 weeks (around 03/27/2022) for Reassessment.  ? ?Haydee Salter, MD ?

## 2022-02-14 DIAGNOSIS — M47816 Spondylosis without myelopathy or radiculopathy, lumbar region: Secondary | ICD-10-CM | POA: Diagnosis not present

## 2022-02-27 ENCOUNTER — Other Ambulatory Visit: Payer: Self-pay | Admitting: Family Medicine

## 2022-02-27 NOTE — Telephone Encounter (Signed)
Refil lrequest for  ?Alprazolam 0.5 mg ?LR 05/24/21, #30, 1 rf ?LOV 02/13/22 ?FOV 03/28/22 ? ?Please review advise  ?Thanks.Dm/cma ? ?

## 2022-03-05 ENCOUNTER — Telehealth: Payer: Self-pay | Admitting: Family Medicine

## 2022-03-05 DIAGNOSIS — H16223 Keratoconjunctivitis sicca, not specified as Sjogren's, bilateral: Secondary | ICD-10-CM | POA: Diagnosis not present

## 2022-03-05 DIAGNOSIS — Z947 Corneal transplant status: Secondary | ICD-10-CM | POA: Diagnosis not present

## 2022-03-05 DIAGNOSIS — H16141 Punctate keratitis, right eye: Secondary | ICD-10-CM | POA: Diagnosis not present

## 2022-03-05 DIAGNOSIS — H0102B Squamous blepharitis left eye, upper and lower eyelids: Secondary | ICD-10-CM | POA: Diagnosis not present

## 2022-03-05 DIAGNOSIS — H0102A Squamous blepharitis right eye, upper and lower eyelids: Secondary | ICD-10-CM | POA: Diagnosis not present

## 2022-03-05 NOTE — Telephone Encounter (Signed)
Left message for patient to call back and schedule Medicare Annual Wellness Visit (AWV) in office.  ? ?If not able to come in office, please offer to do virtually or by telephone.  Left office number and my jabber [phone]. ? ?AWVI eligible as of  01/03/2022  ? ?Please schedule at anytime with Nurse Health Advisor. ?  ?

## 2022-03-06 DIAGNOSIS — H179 Unspecified corneal scar and opacity: Secondary | ICD-10-CM | POA: Diagnosis not present

## 2022-03-06 DIAGNOSIS — H52213 Irregular astigmatism, bilateral: Secondary | ICD-10-CM | POA: Diagnosis not present

## 2022-03-06 DIAGNOSIS — Z947 Corneal transplant status: Secondary | ICD-10-CM | POA: Diagnosis not present

## 2022-03-12 DIAGNOSIS — H353131 Nonexudative age-related macular degeneration, bilateral, early dry stage: Secondary | ICD-10-CM | POA: Diagnosis not present

## 2022-03-12 DIAGNOSIS — H26492 Other secondary cataract, left eye: Secondary | ICD-10-CM | POA: Diagnosis not present

## 2022-03-12 DIAGNOSIS — H0102B Squamous blepharitis left eye, upper and lower eyelids: Secondary | ICD-10-CM | POA: Diagnosis not present

## 2022-03-12 DIAGNOSIS — H0102A Squamous blepharitis right eye, upper and lower eyelids: Secondary | ICD-10-CM | POA: Diagnosis not present

## 2022-03-12 DIAGNOSIS — Z961 Presence of intraocular lens: Secondary | ICD-10-CM | POA: Diagnosis not present

## 2022-03-12 DIAGNOSIS — H16141 Punctate keratitis, right eye: Secondary | ICD-10-CM | POA: Diagnosis not present

## 2022-03-12 DIAGNOSIS — H16223 Keratoconjunctivitis sicca, not specified as Sjogren's, bilateral: Secondary | ICD-10-CM | POA: Diagnosis not present

## 2022-03-12 DIAGNOSIS — Z947 Corneal transplant status: Secondary | ICD-10-CM | POA: Diagnosis not present

## 2022-03-14 ENCOUNTER — Telehealth: Payer: Self-pay | Admitting: Family Medicine

## 2022-03-14 DIAGNOSIS — F419 Anxiety disorder, unspecified: Secondary | ICD-10-CM

## 2022-03-14 MED ORDER — VENLAFAXINE HCL ER 37.5 MG PO CP24: 37.5000 mg | ORAL_CAPSULE | Freq: Every day | ORAL | 3 refills | Status: DC

## 2022-03-14 NOTE — Telephone Encounter (Signed)
Refill request for  ?Venlafaxine XR 37.5 mg ?LR 02/13/22, #30, 11 rf ([facility]) ?LOV 02/13/22 ?FOV 03/28/22 ? ?She would [phone] please and thank you.  Dm/cma  ?

## 2022-03-14 NOTE — Telephone Encounter (Signed)
Pt is wanting her venlafaxine XR (EFFEXOR XR) 37.5 MG 24 hr capsule [[id number]]  sent over to  ?[facility] [place]  ?[address] [place] [address]  ?Phone:  [phone]  Fax:  [phone] ? ?She will be able to get a 90 day supply.  ?

## 2022-03-15 NOTE — Telephone Encounter (Signed)
Patient notified VIA phone. Dm/cma  

## 2022-03-28 ENCOUNTER — Ambulatory Visit (INDEPENDENT_AMBULATORY_CARE_PROVIDER_SITE_OTHER): Payer: Medicare Other | Admitting: Family Medicine

## 2022-03-28 VITALS — BP 130/74 | HR 74 | Temp 97.2°F | Ht 66.0 in | Wt 155.8 lb

## 2022-03-28 DIAGNOSIS — F419 Anxiety disorder, unspecified: Secondary | ICD-10-CM | POA: Diagnosis not present

## 2022-03-28 NOTE — Progress Notes (Signed)
?[facility] ?[facility] ?[place] ?[place] [address] ?Dept: [phone] ?Dept Fax: [phone] ? ?Chronic Care Office Visit ? ?Subjective:  ? ? Patient ID: Katherine Powell, female    DOB: 06-19-1932, 88 y.o..   MRN: [id number] ? ?Chief Complaint  ?Patient presents with  ? Follow-up  ?  6 week f/u.  No concerns.   ? ? ?History of Present Illness: ? ?Patient is in today for reassessment of chronic medical issues. ? ?Katherine Powell has a history of anxiety. She takes a xanax about once a week. At her last visit, she noted feeling her anxiety is with her most days. We started her on Effexor 37.5 mg daily. She notes she was having a little drowsiness after taking her medication. She typically takes int he morning and was taking after breakfast. She has switched to taking this with breakfast and feels the drowsiness may be better. Overall, she feels her mood has improved and would like to continue the medication. ? ?Past Medical History: ?Patient Active Problem List  ? Diagnosis Date Noted  ? Arthropathy of lumbar facet joint 02/13/2022  ? Osteoarthritis of both hands 08/16/2021  ? Long term (current) use of anticoagulants 04/14/2021  ? Personal history of colonic polyps 04/14/2021  ? Mixed incontinence 04/14/2021  ? History of cornea transplant 04/14/2021  ? Essential hypertension 04/14/2021  ? Degeneration of lumbar intervertebral disc 04/14/2021  ? Corneal transplant failure, right eye 04/14/2021  ? Chronic kidney disease, stage 3a ([place]) 04/14/2021  ? Anxiety 04/14/2021  ? Allergic rhinitis 04/14/2021  ? Presbycusis of both ears 04/14/2021  ? Red blood cell antibody positive 12/05/2018  ? Multinodular goiter (nontoxic) 02/11/2017  ? Spinal stenosis of lumbar region 10/31/2016  ? Degenerative spondylolisthesis 10/11/2016  ? Osteopenia 07/18/2016  ? Lumbar radiculopathy 01/05/2015  ? Paroxysmal atrial fibrillation ([place]) 12/29/2014  ? Postmenopausal estrogen deficiency  01/27/2014  ? Malignant neoplasm of upper-outer quadrant of right breast in female, estrogen receptor positive ([place]) 11/17/2013  ? ?Past Surgical History:  ?Procedure Laterality Date  ? ABDOMINAL HYSTERECTOMY  1973  ? endometriosis  ? APPENDECTOMY  1954  ? BACK SURGERY    ? lumb-2005  ? BILATERAL OOPHORECTOMY    ? benign tumor on ovary  ? BREAST LUMPECTOMY WITH NEEDLE LOCALIZATION Right 11/09/2013  ? Procedure: BREAST LUMPECTOMY WITH NEEDLE LOCALIZATION;  Surgeon: Harl Bowie, MD;  Location: [place] [facility];  Service: General;  Laterality: Right;  ? CATARACT EXTRACTION  2013  ? both  ? CYSTOCELE REPAIR  2008  ? EYE SURGERY    ? cataracts-plugs for eyes  ? RECTOCELE REPAIR  2008  ? TONSILLECTOMY    ? ?Family History  ?Problem Relation Age of Onset  ? Alzheimer's disease Mother   ? Stroke Father   ? Heart attack Neg Hx   ? ?Outpatient Medications Prior to Visit  ?Medication Sig Dispense Refill  ? acetaminophen (TYLENOL) 650 MG CR tablet Take 650 mg by mouth every 8 (eight) hours as needed for pain. Reported on 01/19/2016    ? ALPRAZolam (XANAX) 0.5 MG tablet Take 0.5 tablets (0.25 mg total) by mouth 2 (two) times daily as needed for anxiety. 30 tablet 0  ? apixaban (ELIQUIS) 5 MG TABS tablet TAKE 1 TABLET TWICE A DAY 180 tablet 3  ? B Complex-C (SUPER B COMPLEX PO) Take 1 tablet by mouth every morning.    ? cycloSPORINE (RESTASIS) 0.05 % ophthalmic emulsion Place 1 drop into both eyes  2 (two) times daily as needed (dry eyes).    ? diltiazem (CARDIZEM CD) 180 MG 24 hr capsule Take 1 capsule (180 mg total) by mouth daily. 90 capsule 1  ? diltiazem (CARDIZEM) 30 MG tablet Take 1 tablet every 4 hours AS NEEDED for heart rate >100 30 tablet 1  ? fexofenadine (ALLEGRA) 180 MG tablet Take 180 mg by mouth daily as needed (seasonal allergies).    ? flecainide (TAMBOCOR) 100 MG tablet TAKE 1 TABLET TWICE A DAY 180 tablet 2  ? Glucosamine HCl-MSM (GLUCOSAMINE-MSM PO) Take 1 tablet by mouth 2 (two) times daily.     ? melatonin 5 MG TABS Take 5 mg by mouth at bedtime as needed (sleep).    ? Multiple Vitamins-Minerals (HAIR/SKIN/NAILS/BIOTIN) TABS Take 1 tablet by mouth 2 (two) times daily.    ? Multiple Vitamins-Minerals (PRESERVISION AREDS) TABS Take 1 tablet by mouth 2 (two) times daily.    ? Omega-3 Fatty Acids (FISH OIL) 1200 MG CAPS Take by mouth.    ? OVER THE COUNTER MEDICATION Take 1 tablet by mouth every morning. Iron/Vitamin C/ niacin 125-40-3    ? OVER THE COUNTER MEDICATION Take 1 tablet by mouth at bedtime as needed (sleep). Sleep3 - melatonin plus herbals    ? prednisoLONE acetate (PRED FORTE) 1 % ophthalmic suspension Place 1 drop into the right eye 2 (two) times daily.    ? sodium chloride (OCEAN) 0.65 % nasal spray Place 1 spray into the nose 2 (two) times daily as needed for congestion. Reported on 01/19/2016    ? venlafaxine XR (EFFEXOR XR) 37.5 MG 24 hr capsule Take 1 capsule (37.5 mg total) by mouth daily with breakfast. 90 capsule 3  ? Zinc 50 MG TABS Take 50 mg by mouth at bedtime.    ? ?No facility-administered medications prior to visit.  ? ?Allergies  ?Allergen Reactions  ? Codeine Nausea Only  ? Gabapentin Other (See Comments)  ?  Makes her very sleepy  ?  ?Objective:  ? ?Today's Vitals  ? 03/28/22 1021  ?BP: 130/74  ?Pulse: 74  ?Temp: (!) 97.2 ?F (36.2 ?C)  ?TempSrc: Temporal  ?SpO2: 95%  ?Weight: 155 lb 12.8 oz (70.7 kg)  ?Height: '5\' 6"'$  (1.676 m)  ? ?Body mass index is 25.15 kg/m?.  ? ?General: Well developed, well nourished. No acute distress. ?Psych: Alert and oriented. Normal mood and affect. ? ?There are no preventive care reminders to display for this patient. ?  ?Assessment & Plan:  ? ?1. Anxiety ?Patient is noting subjective improvement on venlafaxine 37.5 mg daily. We will continue this. I will reassess her in 3 months. ? ?Return in about 3 months (around 06/27/2022) for Reassessment.  ? ?Haydee Salter, MD ?

## 2022-04-19 ENCOUNTER — Telehealth: Payer: Self-pay | Admitting: Family Medicine

## 2022-04-19 NOTE — Telephone Encounter (Signed)
Left message for patient to call back and schedule Medicare Annual Wellness Visit (AWV). Please offer to do virtually or by telephone.  Left office number and my jabber #[phone]. ? ?Due for AWVI ? ?Please schedule at anytime with Nurse Health Advisor. ?  ?

## 2022-05-15 ENCOUNTER — Ambulatory Visit (INDEPENDENT_AMBULATORY_CARE_PROVIDER_SITE_OTHER): Payer: Medicare Other

## 2022-05-15 DIAGNOSIS — Z0000 Encounter for general adult medical examination without abnormal findings: Secondary | ICD-10-CM | POA: Diagnosis not present

## 2022-05-15 NOTE — Patient Instructions (Signed)
Katherine Powell , Thank you for taking time to come for your Medicare Wellness Visit. I appreciate your ongoing commitment to your health goals. Please review the following plan we discussed and let me know if I can assist you in the future.   Screening recommendations/referrals: Colonoscopy: no longer required  Mammogram: no longer required  Bone Density: 04/01/2018 Recommended yearly ophthalmology/optometry visit for glaucoma screening and checkup Recommended yearly dental visit for hygiene and checkup  Vaccinations: Influenza vaccine: completed  Pneumococcal vaccine: completed  Tdap vaccine: 01/05/2019 Shingles vaccine: completed     Advanced directives: yes   Conditions/risks identified: none   Next appointment: none    Preventive Care 16 Years and Older, Female Preventive care refers to lifestyle choices and visits with your health care provider that can promote health and wellness. What does preventive care include? A yearly physical exam. This is also called an annual well check. Dental exams once or twice a year. Routine eye exams. Ask your health care provider how often you should have your eyes checked. Personal lifestyle choices, including: Daily care of your teeth and gums. Regular physical activity. Eating a healthy diet. Avoiding tobacco and drug use. Limiting alcohol use. Practicing safe sex. Taking low-dose aspirin every day. Taking vitamin and mineral supplements as recommended by your health care provider. What happens during an annual well check? The services and screenings done by your health care provider during your annual well check will depend on your age, overall health, lifestyle risk factors, and family history of disease. Counseling  Your health care provider may ask you questions about your: Alcohol use. Tobacco use. Drug use. Emotional well-being. Home and relationship well-being. Sexual activity. Eating habits. History of falls. Memory and  ability to understand (cognition). Work and work Statistician. Reproductive health. Screening  You may have the following tests or measurements: Height, weight, and BMI. Blood pressure. Lipid and cholesterol levels. These may be checked every 5 years, or more frequently if you are over 27 years old. Skin check. Lung cancer screening. You may have this screening every year starting at age 61 if you have a 30-pack-year history of smoking and currently smoke or have quit within the past 15 years. Fecal occult blood test (FOBT) of the stool. You may have this test every year starting at age 25. Flexible sigmoidoscopy or colonoscopy. You may have a sigmoidoscopy every 5 years or a colonoscopy every 10 years starting at age 85. Hepatitis C blood test. Hepatitis B blood test. Sexually transmitted disease (STD) testing. Diabetes screening. This is done by checking your blood sugar (glucose) after you have not eaten for a while (fasting). You may have this done every 1-3 years. Bone density scan. This is done to screen for osteoporosis. You may have this done starting at age 45. Mammogram. This may be done every 1-2 years. Talk to your health care provider about how often you should have regular mammograms. Talk with your health care provider about your test results, treatment options, and if necessary, the need for more tests. Vaccines  Your health care provider may recommend certain vaccines, such as: Influenza vaccine. This is recommended every year. Tetanus, diphtheria, and acellular pertussis (Tdap, Td) vaccine. You may need a Td booster every 10 years. Zoster vaccine. You may need this after age 50. Pneumococcal 13-valent conjugate (PCV13) vaccine. One dose is recommended after age 21. Pneumococcal polysaccharide (PPSV23) vaccine. One dose is recommended after age 64. Talk to your health care provider about which screenings and vaccines  you need and how often you need them. This information is  not intended to replace advice given to you by your health care provider. Make sure you discuss any questions you have with your health care provider. Document Released: 12/16/2015 Document Revised: 08/08/2016 Document Reviewed: 09/20/2015 Elsevier Interactive Patient Education  2017 [employer] Prevention in the Home Falls can cause injuries. They can happen to people of all ages. There are many things you can do to make your home safe and to help prevent falls. What can I do on the outside of my home? Regularly fix the edges of walkways and driveways and fix any cracks. Remove anything that might make you trip as you walk through a door, such as a raised step or threshold. Trim any bushes or trees on the path to your home. Use bright outdoor lighting. Clear any walking paths of anything that might make someone trip, such as rocks or tools. Regularly check to see if handrails are loose or broken. Make sure that both sides of any steps have handrails. Any raised decks and porches should have guardrails on the edges. Have any leaves, snow, or ice cleared regularly. Use sand or salt on walking paths during winter. Clean up any spills in your garage right away. This includes oil or grease spills. What can I do in the bathroom? Use night lights. Install grab bars by the toilet and in the tub and shower. Do not use towel bars as grab bars. Use non-skid mats or decals in the tub or shower. If you need to sit down in the shower, use a plastic, non-slip stool. Keep the floor dry. Clean up any water that spills on the floor as soon as it happens. Remove soap buildup in the tub or shower regularly. Attach bath mats securely with double-sided non-slip rug tape. Do not have throw rugs and other things on the floor that can make you trip. What can I do in the bedroom? Use night lights. Make sure that you have a light by your bed that is easy to reach. Do not use any sheets or blankets that  are too big for your bed. They should not hang down onto the floor. Have a firm chair that has side arms. You can use this for support while you get dressed. Do not have throw rugs and other things on the floor that can make you trip. What can I do in the kitchen? Clean up any spills right away. Avoid walking on wet floors. Keep items that you use a lot in easy-to-reach places. If you need to reach something above you, use a strong step stool that has a grab bar. Keep electrical cords out of the way. Do not use floor polish or wax that makes floors slippery. If you must use wax, use non-skid floor wax. Do not have throw rugs and other things on the floor that can make you trip. What can I do with my stairs? Do not leave any items on the stairs. Make sure that there are handrails on both sides of the stairs and use them. Fix handrails that are broken or loose. Make sure that handrails are as long as the stairways. Check any carpeting to make sure that it is firmly attached to the stairs. Fix any carpet that is loose or worn. Avoid having throw rugs at the top or bottom of the stairs. If you do have throw rugs, attach them to the floor with carpet tape. Make sure  that you have a light switch at the top of the stairs and the bottom of the stairs. If you do not have them, ask someone to add them for you. What else can I do to help prevent falls? Wear shoes that: Do not have high heels. Have rubber bottoms. Are comfortable and fit you well. Are closed at the toe. Do not wear sandals. If you use a stepladder: Make sure that it is fully opened. Do not climb a closed stepladder. Make sure that both sides of the stepladder are locked into place. Ask someone to hold it for you, if possible. Clearly mark and make sure that you can see: Any grab bars or handrails. First and last steps. Where the edge of each step is. Use tools that help you move around (mobility aids) if they are needed. These  include: Canes. Walkers. Scooters. Crutches. Turn on the lights when you go into a dark area. Replace any light bulbs as soon as they burn out. Set up your furniture so you have a clear path. Avoid moving your furniture around. If any of your floors are uneven, fix them. If there are any pets around you, be aware of where they are. Review your medicines with your doctor. Some medicines can make you feel dizzy. This can increase your chance of falling. Ask your doctor what other things that you can do to help prevent falls. This information is not intended to replace advice given to you by your health care provider. Make sure you discuss any questions you have with your health care provider. Document Released: 09/15/2009 Document Revised: 04/26/2016 Document Reviewed: 12/24/2014 Elsevier Interactive Patient Education  2017 [employer].

## 2022-05-15 NOTE — Progress Notes (Signed)
Subjective:   Katherine Powell is a 88 y.o. female who presents for an Initial Medicare Annual Wellness Visit.   I connected with Katherine Powell today by telephone and verified that I am speaking with the correct person using two identifiers. Location patient: home Location provider: work Persons participating in the virtual visit: patient, provider.   I discussed the limitations, risks, security and privacy concerns of performing an evaluation and management service by telephone and the availability of in person appointments. I also discussed with the patient that there may be a patient responsible charge related to this service. The patient expressed understanding and verbally consented to this telephonic visit.    Interactive audio and video telecommunications were attempted between this provider and patient, however failed, due to patient having technical difficulties OR patient did not have access to video capability.  We continued and completed visit with audio only.    Review of Systems     Cardiac Risk Factors include: advanced age (>29mn, >>30women)     Objective:    Today's Vitals   There is no height or weight on file to calculate BMI.     05/15/2022   11:08 AM 02/03/2019    9:21 AM 12/24/2017    8:52 AM 01/17/2017    4:23 PM 08/29/2016    3:24 PM 07/18/2016    7:33 PM 01/27/2016   10:06 AM  Advanced Directives  Does Patient Have a Medical Advance Directive? Yes No No No No Yes No  Type of AParamedicof AParisLiving will        Copy of [place] Chart? No - copy requested        Would patient like information on creating a medical advance directive?  No - Patient declined   No - patient declined information  No - patient declined information    Current Medications (verified) Outpatient Encounter Medications as of 05/15/2022  Medication Sig   acetaminophen (TYLENOL) 650 MG CR tablet Take 650 mg by mouth every 8  (eight) hours as needed for pain. Reported on 01/19/2016   ALPRAZolam (XANAX) 0.5 MG tablet Take 0.5 tablets (0.25 mg total) by mouth 2 (two) times daily as needed for anxiety.   apixaban (ELIQUIS) 5 MG TABS tablet TAKE 1 TABLET TWICE A DAY   B Complex-C (SUPER B COMPLEX PO) Take 1 tablet by mouth every morning.   cycloSPORINE (RESTASIS) 0.05 % ophthalmic emulsion Place 1 drop into both eyes 2 (two) times daily as needed (dry eyes).   diltiazem (CARDIZEM CD) 180 MG 24 hr capsule Take 1 capsule (180 mg total) by mouth daily.   fexofenadine (ALLEGRA) 180 MG tablet Take 180 mg by mouth daily as needed (seasonal allergies).   flecainide (TAMBOCOR) 100 MG tablet TAKE 1 TABLET TWICE A DAY   Glucosamine HCl-MSM (GLUCOSAMINE-MSM PO) Take 1 tablet by mouth 2 (two) times daily.   melatonin 5 MG TABS Take 5 mg by mouth at bedtime as needed (sleep).   Multiple Vitamins-Minerals (HAIR/SKIN/NAILS/BIOTIN) TABS Take 1 tablet by mouth 2 (two) times daily.   Multiple Vitamins-Minerals (PRESERVISION AREDS) TABS Take 1 tablet by mouth 2 (two) times daily.   Omega-3 Fatty Acids (FISH OIL) 1200 MG CAPS Take by mouth.   OVER THE COUNTER MEDICATION Take 1 tablet by mouth every morning. Iron/Vitamin C/ niacin 125-40-3   prednisoLONE acetate (PRED FORTE) 1 % ophthalmic suspension Place 1 drop into the right eye 2 (two) times daily.  sodium chloride (OCEAN) 0.65 % nasal spray Place 1 spray into the nose 2 (two) times daily as needed for congestion. Reported on 01/19/2016   venlafaxine XR (EFFEXOR XR) 37.5 MG 24 hr capsule Take 1 capsule (37.5 mg total) by mouth daily with breakfast.   diltiazem (CARDIZEM) 30 MG tablet Take 1 tablet every 4 hours AS NEEDED for heart rate >100 (Patient not taking: Reported on 05/15/2022)   OVER THE COUNTER MEDICATION Take 1 tablet by mouth at bedtime as needed (sleep). Sleep3 - melatonin plus herbals (Patient not taking: Reported on 05/15/2022)   Zinc 50 MG TABS Take 50 mg by mouth at bedtime.  (Patient not taking: Reported on 05/15/2022)   No facility-administered encounter medications on file as of 05/15/2022.    Allergies (verified) Codeine and Gabapentin   History: Past Medical History:  Diagnosis Date   Acne rosacea 04/14/2021   Allergy    Anxiety    Arrhythmia    atrial fibrillation/  on    Arthritis    Atrial fibrillation (HCC)    Breast cancer ([place]) 11/09/13   right upper outer   Contact lens/glasses fitting    contact rt eye   Hx of radiation therapy 12/15/13- 01/11/14   right breast 4250 cGy in 17 sessions, (hypo-fractionated), no boost   Hypertension    Postmenopausal    Rosacea    Seasonal allergies    Sleep disturbance    Thyroid disease    Urinary incontinence    Past Surgical History:  Procedure Laterality Date   ABDOMINAL HYSTERECTOMY  1973   endometriosis   [place]     lumb-2005   BILATERAL OOPHORECTOMY     benign tumor on ovary   BREAST LUMPECTOMY WITH NEEDLE LOCALIZATION Right 11/09/2013   Procedure: BREAST LUMPECTOMY WITH NEEDLE LOCALIZATION;  Surgeon: Harl Bowie, MD;  Location: [facility];  Service: General;  Laterality: Right;   CATARACT EXTRACTION  2013   both   CYSTOCELE REPAIR  2008   EYE SURGERY     cataracts-plugs for eyes   RECTOCELE REPAIR  2008   TONSILLECTOMY     Family History  Problem Relation Age of Onset   Alzheimer's disease Mother    Stroke Father    Heart attack Neg Hx    Social History   Socioeconomic History   Marital status: Married    Spouse name: Not on file   Number of children: 3   Years of education: Not on file   Highest education level: Not on file  Occupational History   Not on file  Tobacco Use   Smoking status: Never   Smokeless tobacco: Never  Vaping Use   Vaping Use: Never used  Substance and Sexual Activity   Alcohol use: Yes    Comment: socially   Drug use: No   Sexual activity: Never    Comment: menarche age 26, P26, first live birth  age 21,  HRT x 30 years  Other Topics Concern   Not on file  Social History Narrative   2nd marriage.   First husband is deceased.   7 grandchildren   7 great grandchildren   Social Determinants of Health   Financial Resource Strain: Low Risk  (05/15/2022)   Overall Financial Resource Strain (CARDIA)    Difficulty of Paying Living Expenses: Not hard at all  Food Insecurity: No Food Insecurity (05/15/2022)   Hunger Vital Sign    Worried About Running Out  of Food in the Last Year: Never true    [place] in the Last Year: Never true  Transportation Needs: No Transportation Needs (05/15/2022)   PRAPARE - Hydrologist (Medical): No    Lack of Transportation (Non-Medical): No  Physical Activity: Insufficiently Active (05/15/2022)   Exercise Vital Sign    Days of Exercise per Week: 3 days    Minutes of Exercise per Session: 40 min  Stress: No Stress Concern Present (05/15/2022)   [place]    Feeling of Stress : Not at all  Social Connections: [place] (05/15/2022)   Social Connection and Isolation Panel [NHANES]    Frequency of Communication with Friends and Family: Three times a week    Frequency of Social Gatherings with Friends and Family: Three times a week    Attends Religious Services: More than 4 times per year    Active Member of Clubs or Organizations: Yes    Attends Music therapist: More than 4 times per year    Marital Status: Married    Tobacco Counseling Counseling given: Not Answered   Clinical Intake:  Pre-visit preparation completed: Yes  Pain : No/denies pain     Nutritional Risks: None Diabetes: No  How often do you need to have someone help you when you read instructions, pamphlets, or other written materials from your doctor or pharmacy?: 1 - Never What is the last grade level you completed in school?: Nursing  Diabetic?no    Interpreter Needed?: No  Information entered by :: [place]   Activities of Daily Living    05/15/2022   11:11 AM  In your present state of health, do you have any difficulty performing the following activities:  Hearing? 0  Vision? 0  Difficulty concentrating or making decisions? 0  Walking or climbing stairs? 0  Dressing or bathing? 0  Doing errands, shopping? 0  Preparing Food and eating ? N  Using the Toilet? N  In the past six months, have you accidently leaked urine? N  Do you have problems with loss of bowel control? N  Managing your Medications? N  Managing your Finances? N  Housekeeping or managing your Housekeeping? N    Patient Care Team: Haydee Salter, MD as PCP - General (Family Medicine) Belva Crome, MD as PCP - Cardiology (Cardiology) Magrinat, Virgie Dad, MD (Inactive) as Consulting Physician (Oncology) Coralie Keens, MD as Consulting Physician (General Surgery) Phil Dopp, MD as Referring Physician (Ophthalmology) Belva Crome, MD as Consulting Physician (Cardiology) Warden Fillers, MD as Consulting Physician (Ophthalmology)  Indicate any recent Medical Services you may have received from other than [facility] providers in the past year (date may be approximate).     Assessment:   This is a routine wellness examination for [place].  Hearing/Vision screen Vision Screening - Comments:: Annual eye exams wear glasses   Dietary issues and exercise activities discussed: Current Exercise Habits: Home exercise routine, Type of exercise: walking, Time (Minutes): 40, Frequency (Times/Week): 3, Weekly Exercise (Minutes/Week): 120, Intensity: Mild, Exercise limited by: None identified   Goals Addressed   None    Depression Screen    05/15/2022   11:09 AM 05/15/2022   11:07 AM 02/13/2022   10:53 AM 04/14/2021    9:06 AM  PHQ 2/9 Scores  PHQ - 2 Score 0 0 0 0    Fall Risk    05/15/2022   11:09  AM 02/13/2022   10:53 AM 08/16/2021     8:58 AM  Fall Risk   Falls in the past year? 0 0 0  Number falls in past yr: 0 0 0  Injury with Fall? 0 0 0  Risk for fall due to :  No Fall Risks No Fall Risks  Follow up Falls evaluation completed;Education provided Falls evaluation completed     [place]:  Any stairs in or around the home? No  If so, are there any without handrails? No  Home free of loose throw rugs in walkways, pet beds, electrical cords, etc? Yes  Adequate lighting in your home to reduce risk of falls? Yes   ASSISTIVE DEVICES UTILIZED TO PREVENT FALLS:  Life alert? No  Use of a cane, walker or w/c? No  Grab bars in the bathroom? Yes  Shower chair or bench in shower? Yes  Elevated toilet seat or a handicapped toilet? Yes   Cognitive Function:    Normal cognitive status assessed by telephone conversation by this Nurse Health Advisor. No abnormalities found.      Immunizations Immunization History  Administered Date(s) Administered   Fluad Quad(high Dose 65+) 08/16/2021   Influenza,inj,Quad PF,6+ Mos 10/12/2015   PFIZER(Purple Top)SARS-COV-2 Vaccination 12/23/2019, 01/13/2020, 08/29/2020, 10/20/2020, 03/13/2021   Pneumococcal Conjugate-13 10/05/2014   Pneumococcal Polysaccharide-23 08/22/2009   Tdap 09/13/2006, 01/05/2019   Zoster Recombinat (Shingrix) 09/02/2018, 01/23/2019   Zoster, Live 09/13/2006, 09/02/2018, 01/23/2019    TDAP status: Up to date  Flu Vaccine status: Up to date  Pneumococcal vaccine status: Up to date  Covid-19 vaccine status: Completed vaccines  Qualifies for Shingles Vaccine? Yes   Zostavax completed Yes   Shingrix Completed?: Yes  Screening Tests Health Maintenance  Topic Date Due   COVID-19 Vaccine (6 - Booster for Pfizer series) 05/08/2021   INFLUENZA VACCINE  07/03/2022   TETANUS/TDAP  01/05/2029   Pneumonia Vaccine 94+ Years old  Completed   DEXA SCAN  Completed   Zoster Vaccines- Shingrix  Completed   HPV VACCINES  Aged  Out    Health Maintenance  Health Maintenance Due  Topic Date Due   COVID-19 Vaccine (6 - Booster for [employer] series) 05/08/2021    Colorectal cancer screening: No longer required.   Mammogram status: No longer required due to age.  Bone Density status: Completed 04/01/2018. Results reflect: Bone density results: OSTEOPENIA. Repeat every 5 years.  Lung Cancer Screening: (Low Dose CT Chest recommended if Age 30-80 years, 30 pack-year currently smoking OR have quit w/in 15years.) does not qualify.   Lung Cancer Screening Referral: n/a  Additional Screening:  Hepatitis C Screening: does not qualify;   Vision Screening: Recommended annual ophthalmology exams for early detection of glaucoma and other disorders of the eye. Is the patient up to date with their annual eye exam?  Yes  Who is the provider or what is the name of the office in which the patient attends annual eye exams? Dr.Groat  If pt is not established with a provider, would they like to be referred to a provider to establish care? No .   Dental Screening: Recommended annual dental exams for proper oral hygiene  Community Resource Referral / Chronic Care Management: CRR required this visit?  No   CCM required this visit?  No      Plan:     I have personally reviewed and noted the following in the patient's chart:   Medical and social history Use of  alcohol, tobacco or illicit drugs  Current medications and supplements including opioid prescriptions. Patient is not currently taking opioid prescriptions. Functional ability and status Nutritional status Physical activity Advanced directives List of other physicians Hospitalizations, surgeries, and ER visits in previous 12 months Vitals Screenings to include cognitive, depression, and falls Referrals and appointments  In addition, I have reviewed and discussed with patient certain preventive protocols, quality metrics, and best practice recommendations. A  written personalized care plan for preventive services as well as general preventive health recommendations were provided to patient.     Randel Pigg, LPN   [id number]   Nurse Notes: none

## 2022-05-16 DIAGNOSIS — Z85828 Personal history of other malignant neoplasm of skin: Secondary | ICD-10-CM | POA: Diagnosis not present

## 2022-05-16 DIAGNOSIS — L821 Other seborrheic keratosis: Secondary | ICD-10-CM | POA: Diagnosis not present

## 2022-05-16 DIAGNOSIS — D225 Melanocytic nevi of trunk: Secondary | ICD-10-CM | POA: Diagnosis not present

## 2022-05-16 DIAGNOSIS — L718 Other rosacea: Secondary | ICD-10-CM | POA: Diagnosis not present

## 2022-05-23 DIAGNOSIS — Z961 Presence of intraocular lens: Secondary | ICD-10-CM | POA: Diagnosis not present

## 2022-05-23 DIAGNOSIS — H26492 Other secondary cataract, left eye: Secondary | ICD-10-CM | POA: Diagnosis not present

## 2022-05-23 DIAGNOSIS — H0102B Squamous blepharitis left eye, upper and lower eyelids: Secondary | ICD-10-CM | POA: Diagnosis not present

## 2022-05-23 DIAGNOSIS — H16141 Punctate keratitis, right eye: Secondary | ICD-10-CM | POA: Diagnosis not present

## 2022-05-23 DIAGNOSIS — H02042 Spastic entropion of right lower eyelid: Secondary | ICD-10-CM | POA: Diagnosis not present

## 2022-05-23 DIAGNOSIS — H0102A Squamous blepharitis right eye, upper and lower eyelids: Secondary | ICD-10-CM | POA: Diagnosis not present

## 2022-05-23 DIAGNOSIS — H353131 Nonexudative age-related macular degeneration, bilateral, early dry stage: Secondary | ICD-10-CM | POA: Diagnosis not present

## 2022-05-23 DIAGNOSIS — H43813 Vitreous degeneration, bilateral: Secondary | ICD-10-CM | POA: Diagnosis not present

## 2022-05-23 DIAGNOSIS — H16223 Keratoconjunctivitis sicca, not specified as Sjogren's, bilateral: Secondary | ICD-10-CM | POA: Diagnosis not present

## 2022-05-23 DIAGNOSIS — Z947 Corneal transplant status: Secondary | ICD-10-CM | POA: Diagnosis not present

## 2022-06-04 ENCOUNTER — Encounter (INDEPENDENT_AMBULATORY_CARE_PROVIDER_SITE_OTHER): Payer: Medicare Other | Admitting: Ophthalmology

## 2022-06-04 ENCOUNTER — Encounter: Payer: Self-pay | Admitting: Oncology

## 2022-06-04 DIAGNOSIS — I10 Essential (primary) hypertension: Secondary | ICD-10-CM | POA: Diagnosis not present

## 2022-06-04 DIAGNOSIS — H353114 Nonexudative age-related macular degeneration, right eye, advanced atrophic with subfoveal involvement: Secondary | ICD-10-CM

## 2022-06-04 DIAGNOSIS — H35033 Hypertensive retinopathy, bilateral: Secondary | ICD-10-CM | POA: Diagnosis not present

## 2022-06-04 DIAGNOSIS — H353122 Nonexudative age-related macular degeneration, left eye, intermediate dry stage: Secondary | ICD-10-CM | POA: Diagnosis not present

## 2022-06-04 DIAGNOSIS — H43813 Vitreous degeneration, bilateral: Secondary | ICD-10-CM | POA: Diagnosis not present

## 2022-06-27 ENCOUNTER — Encounter: Payer: Self-pay | Admitting: Family Medicine

## 2022-06-27 ENCOUNTER — Ambulatory Visit (INDEPENDENT_AMBULATORY_CARE_PROVIDER_SITE_OTHER): Payer: Medicare Other | Admitting: Family Medicine

## 2022-06-27 VITALS — BP 120/70 | HR 80 | Temp 97.1°F | Ht 66.0 in | Wt 157.4 lb

## 2022-06-27 DIAGNOSIS — K5900 Constipation, unspecified: Secondary | ICD-10-CM

## 2022-06-27 DIAGNOSIS — F419 Anxiety disorder, unspecified: Secondary | ICD-10-CM | POA: Diagnosis not present

## 2022-06-27 DIAGNOSIS — I10 Essential (primary) hypertension: Secondary | ICD-10-CM | POA: Diagnosis not present

## 2022-06-27 NOTE — Progress Notes (Signed)
[facility] [address] [address] Dept: [phone] Dept Fax: [phone]  Chronic Care Office Visit  Subjective:    Patient ID: Katherine Powell, female    DOB: 10/22/1932, 88 y.o..   MRN: [id number]  Chief Complaint  Patient presents with   Follow-up    3 month f/u.  No concerns.   Not fasting today.     History of Present Illness:  Patient is in today for reassessment of chronic medical issues.  [name] has a history of hypertension. She is managed on diltiazem CD 180 mg daily . She monitors her blood pressure at home and it has been in good control overall. She feels her treatment for anxiety has helped her BP do better.   [name] has a history of anxiety. She was previously taking only Xanax about once a week for this. At her last visit, we added a low dose of venlafaxine. She feels this is doing better overall.  [name] notes over the past 1-2 weeks she has been having issues with constipation. She consumes a fair amount of dietary fiber. She has tried using a Dulcolax suppository, but hasn't found that this helped a great deal. She does feel she is hydrating adequately.  Past Medical History: Patient Active Problem List   Diagnosis Date Noted   Arthropathy of lumbar facet joint 02/13/2022   Osteoarthritis of both hands 08/16/2021   Long term (current) use of anticoagulants 04/14/2021   Personal history of colonic polyps 04/14/2021   Mixed incontinence 04/14/2021   History of cornea transplant 04/14/2021   Essential hypertension 04/14/2021   Degeneration of lumbar intervertebral disc 04/14/2021   Corneal transplant failure, right eye 04/14/2021   Chronic kidney disease, stage 3a ([place]) 04/14/2021   Anxiety 04/14/2021   Allergic rhinitis 04/14/2021   Presbycusis of both ears 04/14/2021   Red blood cell antibody positive 12/05/2018   Multinodular goiter (nontoxic) 02/11/2017   Spinal  stenosis of lumbar region 10/31/2016   Degenerative spondylolisthesis 10/11/2016   Osteopenia 07/18/2016   Lumbar radiculopathy 01/05/2015   Paroxysmal atrial fibrillation ([place]) 12/29/2014   Postmenopausal estrogen deficiency 01/27/2014   Malignant neoplasm of upper-outer quadrant of right breast in female, estrogen receptor positive ([place]) 11/17/2013   Past Surgical History:  Procedure Laterality Date   ABDOMINAL HYSTERECTOMY  1973   endometriosis   APPENDECTOMY  1954   BACK SURGERY     lumb-2005   BILATERAL OOPHORECTOMY     benign tumor on ovary   BREAST LUMPECTOMY WITH NEEDLE LOCALIZATION Right 11/09/2013   Procedure: BREAST LUMPECTOMY WITH NEEDLE LOCALIZATION;  Surgeon: Harl Bowie, MD;  Location: [facility];  Service: General;  Laterality: Right;   CATARACT EXTRACTION  2013   both   CYSTOCELE REPAIR  2008   EYE SURGERY     cataracts-plugs for eyes   RECTOCELE REPAIR  2008   TONSILLECTOMY     Family History  Problem Relation Age of Onset   Alzheimer's disease Mother    Stroke Father    Heart attack Neg Hx    Outpatient Medications Prior to Visit  Medication Sig Dispense Refill   acetaminophen (TYLENOL) 650 MG CR tablet Take 650 mg by mouth every 8 (eight) hours as needed for pain. Reported on 01/19/2016     ALPRAZolam (XANAX) 0.5 MG tablet Take 0.5 tablets (0.25 mg total) by mouth 2 (two) times daily as needed for anxiety. 30 tablet 0  apixaban (ELIQUIS) 5 MG TABS tablet TAKE 1 TABLET TWICE A DAY 180 tablet 3   B Complex-C (SUPER B COMPLEX PO) Take 1 tablet by mouth every morning.     cycloSPORINE (RESTASIS) 0.05 % ophthalmic emulsion Place 1 drop into both eyes 2 (two) times daily as needed (dry eyes).     diltiazem (CARDIZEM CD) 180 MG 24 hr capsule Take 1 capsule (180 mg total) by mouth daily. 90 capsule 1   fexofenadine (ALLEGRA) 180 MG tablet Take 180 mg by mouth daily as needed (seasonal allergies).     flecainide (TAMBOCOR) 100 MG tablet TAKE 1  TABLET TWICE A DAY 180 tablet 2   Glucosamine HCl-MSM (GLUCOSAMINE-MSM PO) Take 1 tablet by mouth 2 (two) times daily.     melatonin 5 MG TABS Take 5 mg by mouth at bedtime as needed (sleep).     Multiple Vitamins-Minerals (HAIR/SKIN/NAILS/BIOTIN) TABS Take 1 tablet by mouth 2 (two) times daily.     Multiple Vitamins-Minerals (PRESERVISION AREDS) TABS Take 1 tablet by mouth 2 (two) times daily.     Omega-3 Fatty Acids (FISH OIL) 1200 MG CAPS Take by mouth.     OVER THE COUNTER MEDICATION Take 1 tablet by mouth every morning. Iron/Vitamin C/ niacin 125-40-3     OVER THE COUNTER MEDICATION Take 1 tablet by mouth at bedtime as needed (sleep). Sleep3 - melatonin plus herbals     prednisoLONE acetate (PRED FORTE) 1 % ophthalmic suspension Place 1 drop into the right eye 2 (two) times daily.     sodium chloride (OCEAN) 0.65 % nasal spray Place 1 spray into the nose 2 (two) times daily as needed for congestion. Reported on 01/19/2016     venlafaxine XR (EFFEXOR XR) 37.5 MG 24 hr capsule Take 1 capsule (37.5 mg total) by mouth daily with breakfast. 90 capsule 3   Zinc 50 MG TABS Take 50 mg by mouth at bedtime.     diltiazem (CARDIZEM) 30 MG tablet Take 1 tablet every 4 hours AS NEEDED for heart rate >100 30 tablet 1   No facility-administered medications prior to visit.   Allergies  Allergen Reactions   Codeine Nausea Only   Gabapentin Other (See Comments)    Makes her very sleepy    Objective:   Today's Vitals   06/27/22 1033  BP: 120/70  Pulse: 80  Temp: (!) 97.1 F (36.2 C)  TempSrc: Temporal  SpO2: 97%  Weight: 157 lb 6.4 oz (71.4 kg)  Height: '5\' 6"'$  (1.676 m)   Body mass index is 25.41 kg/m.   General: Well developed, well nourished. No acute distress. Psych: Alert and oriented. Normal mood and affect.  Health Maintenance Due  Topic Date Due   COVID-19 Vaccine (6 - Booster for Pfizer series) 05/08/2021     Assessment & Plan:   1. Essential hypertension Blood pressure is in  good control. Continue diltiazem CD 180 mg daily.  2. Anxiety Improved on venlafaxine 37.5 mg daily.  3. Constipation, unspecified constipation type Support her efforts at dietary fiber and adequate hydration. I recommended she try Miralax and see if this works better for her than the stimulant laxatives.  Return in about 6 months (around 12/28/2022) for Reassessment.   Haydee Salter, MD

## 2022-07-10 DIAGNOSIS — H52213 Irregular astigmatism, bilateral: Secondary | ICD-10-CM | POA: Diagnosis not present

## 2022-07-10 DIAGNOSIS — H179 Unspecified corneal scar and opacity: Secondary | ICD-10-CM | POA: Diagnosis not present

## 2022-07-19 ENCOUNTER — Telehealth: Payer: Self-pay | Admitting: Interventional Cardiology

## 2022-07-19 NOTE — Telephone Encounter (Signed)
*  STAT* If patient is at the pharmacy, call can be transferred to refill team.   1. Which medications need to be refilled? (please list name of each medication and dose if known)  diltiazem (CARDIZEM CD) 180 MG 24 hr capsule  2. Which pharmacy/location (including street and city if local pharmacy) is medication to be sent to? [facility], [place]  3. Do they need a 30 day or 90 day supply? 90 day supply

## 2022-07-20 MED ORDER — DILTIAZEM HCL ER COATED BEADS 180 MG PO CP24: 180.0000 mg | ORAL_CAPSULE | Freq: Every day | ORAL | 0 refills | Status: DC

## 2022-07-20 NOTE — Telephone Encounter (Signed)
Pt's medication was sent to pt's pharmacy as requested. Confirmation received.  °

## 2022-07-31 NOTE — Progress Notes (Unsigned)
Cardiology Office Note:    Date:  08/01/2022   ID:  Katherine Powell, DOB 06/17/1932, MRN [id number]  PCP:  Haydee Salter, MD  Cardiologist:  Sinclair Grooms, MD   Referring MD: Haydee Salter, MD   Chief Complaint  Patient presents with   Atrial Fibrillation   Hypertension   Hyperlipidemia    History of Present Illness:    Katherine Powell is a 88 y.o. female with a hx of paroxysmal atrial fibrillation, rhythm control with flecainide therapy, and chronic anticoagulation using Eliquis.   He is asymptomatic relative to atrial fibrillation.  Minimal episodes.  Has some bruising on Eliquis.  Otherwise doing okay.  Denies orthopnea, PND, syncope, and angina.  No falls or head trauma.  Past Medical History:  Diagnosis Date   Acne rosacea 04/14/2021   Allergy    Anxiety    Arrhythmia    atrial fibrillation/  on    Arthritis    Atrial fibrillation ([place])    Breast cancer ([place]) 11/09/13   right upper outer   Contact lens/glasses fitting    contact rt eye   Hx of radiation therapy 12/15/13- 01/11/14   right breast 4250 cGy in 17 sessions, (hypo-fractionated), no boost   Hypertension    Postmenopausal    Rosacea    Seasonal allergies    Sleep disturbance    Thyroid disease    Urinary incontinence     Past Surgical History:  Procedure Laterality Date   ABDOMINAL HYSTERECTOMY  1973   endometriosis   APPENDECTOMY  1954   BACK SURGERY     lumb-2005   BILATERAL OOPHORECTOMY     benign tumor on ovary   BREAST LUMPECTOMY WITH NEEDLE LOCALIZATION Right 11/09/2013   Procedure: BREAST LUMPECTOMY WITH NEEDLE LOCALIZATION;  Surgeon: Harl Bowie, MD;  Location: [facility];  Service: General;  Laterality: Right;   CATARACT EXTRACTION  2013   both   CYSTOCELE REPAIR  2008   EYE SURGERY     cataracts-plugs for eyes   RECTOCELE REPAIR  2008   TONSILLECTOMY      Current Medications: Current Meds  Medication Sig   acetaminophen (TYLENOL) 650 MG CR  tablet Take 650 mg by mouth every 8 (eight) hours as needed for pain. Reported on 01/19/2016   ALPRAZolam (XANAX) 0.5 MG tablet Take 0.5 tablets (0.25 mg total) by mouth 2 (two) times daily as needed for anxiety.   apixaban (ELIQUIS) 5 MG TABS tablet TAKE 1 TABLET TWICE A DAY   B Complex-C (SUPER B COMPLEX PO) Take 1 tablet by mouth every morning.   cycloSPORINE (RESTASIS) 0.05 % ophthalmic emulsion Place 1 drop into both eyes 2 (two) times daily as needed (dry eyes).   fexofenadine (ALLEGRA) 180 MG tablet Take 180 mg by mouth daily as needed (seasonal allergies).   Glucosamine HCl-MSM (GLUCOSAMINE-MSM PO) Take 1 tablet by mouth 2 (two) times daily.   melatonin 5 MG TABS Take 5 mg by mouth at bedtime as needed (sleep).   Multiple Vitamins-Minerals (HAIR/SKIN/NAILS/BIOTIN) TABS Take 1 tablet by mouth 2 (two) times daily.   Multiple Vitamins-Minerals (PRESERVISION AREDS) TABS Take 1 tablet by mouth 2 (two) times daily.   Omega-3 Fatty Acids (FISH OIL) 1200 MG CAPS Take by mouth.   OVER THE COUNTER MEDICATION Take 1 tablet by mouth at bedtime as needed (sleep). Sleep3 - melatonin plus herbals   prednisoLONE acetate (PRED FORTE) 1 % ophthalmic suspension Place 1 drop into the  right eye 2 (two) times daily.   sodium chloride (OCEAN) 0.65 % nasal spray Place 1 spray into the nose 2 (two) times daily as needed for congestion. Reported on 01/19/2016   venlafaxine XR (EFFEXOR XR) 37.5 MG 24 hr capsule Take 1 capsule (37.5 mg total) by mouth daily with breakfast.   Zinc 50 MG TABS Take 50 mg by mouth at bedtime.   [DISCONTINUED] diltiazem (CARDIZEM CD) 180 MG 24 hr capsule Take 1 capsule (180 mg total) by mouth daily.   [DISCONTINUED] flecainide (TAMBOCOR) 100 MG tablet TAKE 1 TABLET TWICE A DAY     Allergies:   Codeine and Gabapentin   Social History   Socioeconomic History   Marital status: Married    Spouse name: Not on file   Number of children: 3   Years of education: Not on file   Highest  education level: Not on file  Occupational History   Not on file  Tobacco Use   Smoking status: Never   Smokeless tobacco: Never  Vaping Use   Vaping Use: Never used  Substance and Sexual Activity   Alcohol use: Yes    Comment: socially   Drug use: No   Sexual activity: Never    Comment: menarche age 61, P38, first live birth age 62,  HRT x 30 years  Other Topics Concern   Not on file  Social History Narrative   2nd marriage.   First husband is deceased.   7 grandchildren   7 great grandchildren   Social Determinants of Health   Financial Resource Strain: Low Risk  (05/15/2022)   Overall Financial Resource Strain (CARDIA)    Difficulty of Paying Living Expenses: Not hard at all  Food Insecurity: No Food Insecurity (05/15/2022)   Hunger Vital Sign    Worried About Running Out of Food in the Last Year: Never true    Ran Out of Food in the Last Year: Never true  Transportation Needs: No Transportation Needs (05/15/2022)   PRAPARE - Hydrologist (Medical): No    Lack of Transportation (Non-Medical): No  Physical Activity: Insufficiently Active (05/15/2022)   Exercise Vital Sign    Days of Exercise per Week: 3 days    Minutes of Exercise per Session: 40 min  Stress: No Stress Concern Present (05/15/2022)   [place]    Feeling of Stress : Not at all  Social Connections: [place] (05/15/2022)   Social Connection and Isolation Panel [NHANES]    Frequency of Communication with Friends and Family: Three times a week    Frequency of Social Gatherings with Friends and Family: Three times a week    Attends Religious Services: More than 4 times per year    Active Member of Clubs or Organizations: Yes    Attends Music therapist: More than 4 times per year    Marital Status: Married     Family History: The patient's family history includes Alzheimer's disease in her  mother; Stroke in her father. There is no history of Heart attack.  ROS:   Please see the history of present illness.    Has superficial varicosities, particularly behind the right knee.  And venous insufficiency with swelling of the left lower extremity.  Some bleeding from superficial veins behind the knee.  All other systems reviewed and are negative.  EKGs/Labs/Other Studies Reviewed:    The following studies were reviewed today: No new data  EKG:  EKG form February 2023 did not reveal any particular abnormality other than a PVC.  Recent Labs: 08/16/2021: TSH 0.98 01/11/2022: B Natriuretic Peptide 256.4; Hemoglobin 13.0; Magnesium 2.1; Platelets 279 02/13/2022: ALT 14; BUN 22; Creatinine, Ser 1.07; Potassium 4.1; Sodium 134  Recent Lipid Panel No results found for: "CHOL", "TRIG", "HDL", "CHOLHDL", "VLDL", "LDLCALC", "LDLDIRECT"  Physical Exam:    VS:  BP 136/70   Pulse 64   Ht '5\' 6"'$  (1.676 m)   Wt 158 lb 12.8 oz (72 kg)   SpO2 98%   BMI 25.63 kg/m     Wt Readings from Last 3 Encounters:  08/01/22 158 lb 12.8 oz (72 kg)  06/27/22 157 lb 6.4 oz (71.4 kg)  03/28/22 155 lb 12.8 oz (70.7 kg)     GEN: Appears younger than stated age.. No acute distress HEENT: Normal NECK: No JVD. LYMPHATICS: No lymphadenopathy CARDIAC: No murmur. RRR no gallop, or edema. VASCULAR:  Normal Pulses. No bruits. RESPIRATORY:  Clear to auscultation without rales, wheezing or rhonchi  ABDOMEN: Soft, non-tender, non-distended, No pulsatile mass, MUSCULOSKELETAL: No deformity  SKIN: Warm and dry NEUROLOGIC:  Alert and oriented x 3 PSYCHIATRIC:  Normal affect   ASSESSMENT:    1. Paroxysmal atrial fibrillation (H[facility])   2. Secondary hypercoagulable state ([place])   3. Essential hypertension   4. Chronic anticoagulation   5. TIA (transient ischemic attack)   6. Lower extremity edema   7. Spider varicose veins    PLAN:    In order of problems listed above:  Controlled on Tambocor. No  significant bleeding on Eliquis. Controlled blood pressure. Has been having some increased bruising on Eliquis. No recurrent neurological symptoms Venous insufficiency worse on right than left. Significant superficial spider veins of venous lakes behind both knees with bleeding in a patient on anticoagulation.  Refer to Vascular and Vein Specialist for consideration of ablative therapy.  1 year follow-up with Dr. Johney Frame.  Medication Adjustments/Labs and Tests Ordered: Current medicines are reviewed at length with the patient today.  Concerns regarding medicines are outlined above.  Orders Placed This Encounter  Procedures   Ambulatory referral to Vascular Surgery   Meds ordered this encounter  Medications   diltiazem (CARDIZEM CD) 180 MG 24 hr capsule    Sig: Take 1 capsule (180 mg total) by mouth daily.    Dispense:  90 capsule    Refill:  3   flecainide (TAMBOCOR) 100 MG tablet    Sig: Take 1 tablet (100 mg total) by mouth 2 (two) times daily.    Dispense:  180 tablet    Refill:  3    Patient Instructions  Medication Instructions:  Your physician recommends that you continue on your current medications as directed. Please refer to the Current Medication list given to you today.  *If you need a refill on your cardiac medications before your next appointment, please call your pharmacy*  Lab Work: NONE  Testing/Procedures: NONE  Follow-Up: At [facility], you and your health needs are our priority.  As part of our continuing mission to provide you with exceptional heart care, we have created designated Provider Care Teams.  These Care Teams include your primary Cardiologist (physician) and Advanced Practice Providers (APPs -  Physician Assistants and Nurse Practitioners) who all work together to provide you with the care you need, when you need it.  Your next appointment:   1 year(s)  The format for your next appointment:   In Person  Provider:  Sinclair Grooms, MD     Other Instructions Your physician has referred you to [facility] for evaluation of lower extremity swelling and bleeding. Their scheduler will call you to setup an appointment. Please allow 2-4 weeks for them to call you.  Important Information About Sugar         Signed, Sinclair Grooms, MD  08/01/2022 11:36 AM    [facility]

## 2022-08-01 ENCOUNTER — Ambulatory Visit: Payer: Medicare Other | Attending: Interventional Cardiology | Admitting: Interventional Cardiology

## 2022-08-01 ENCOUNTER — Telehealth: Payer: Self-pay | Admitting: Family Medicine

## 2022-08-01 ENCOUNTER — Encounter: Payer: Self-pay | Admitting: Interventional Cardiology

## 2022-08-01 VITALS — BP 136/70 | HR 64 | Ht 66.0 in | Wt 158.8 lb

## 2022-08-01 DIAGNOSIS — Z7901 Long term (current) use of anticoagulants: Secondary | ICD-10-CM | POA: Diagnosis not present

## 2022-08-01 DIAGNOSIS — G459 Transient cerebral ischemic attack, unspecified: Secondary | ICD-10-CM | POA: Diagnosis not present

## 2022-08-01 DIAGNOSIS — D6869 Other thrombophilia: Secondary | ICD-10-CM

## 2022-08-01 DIAGNOSIS — R600 Localized edema: Secondary | ICD-10-CM | POA: Diagnosis not present

## 2022-08-01 DIAGNOSIS — I868 Varicose veins of other specified sites: Secondary | ICD-10-CM | POA: Diagnosis not present

## 2022-08-01 DIAGNOSIS — I10 Essential (primary) hypertension: Secondary | ICD-10-CM

## 2022-08-01 DIAGNOSIS — I480 Paroxysmal atrial fibrillation: Secondary | ICD-10-CM

## 2022-08-01 MED ORDER — FLECAINIDE ACETATE 100 MG PO TABS: 100.0000 mg | ORAL_TABLET | Freq: Two times a day (BID) | ORAL | 3 refills | Status: DC

## 2022-08-01 MED ORDER — DILTIAZEM HCL ER COATED BEADS 180 MG PO CP24: 180.0000 mg | ORAL_CAPSULE | Freq: Every day | ORAL | 3 refills | Status: DC

## 2022-08-01 NOTE — Telephone Encounter (Signed)
Lft VM to rtn call. Dm/cma  

## 2022-08-01 NOTE — Patient Instructions (Signed)
Medication Instructions:  Your physician recommends that you continue on your current medications as directed. Please refer to the Current Medication list given to you today.  *If you need a refill on your cardiac medications before your next appointment, please call your pharmacy*  Lab Work: NONE  Testing/Procedures: NONE  Follow-Up: At [facility], you and your health needs are our priority.  As part of our continuing mission to provide you with exceptional heart care, we have created designated Provider Care Teams.  These Care Teams include your primary Cardiologist (physician) and Advanced Practice Providers (APPs -  Physician Assistants and Nurse Practitioners) who all work together to provide you with the care you need, when you need it.  Your next appointment:   1 year(s)  The format for your next appointment:   In Person  Provider:   Sinclair Grooms, MD     Other Instructions Your physician has referred you to [facility] for evaluation of lower extremity swelling and bleeding. Their scheduler will call you to setup an appointment. Please allow 2-4 weeks for them to call you.  Important Information About Sugar

## 2022-08-01 NOTE — Telephone Encounter (Signed)
Pt would like to know if Dr Gena Fray promotes the RSV vaccinations. Please call.

## 2022-08-03 NOTE — Telephone Encounter (Signed)
Lft VM to rtn call. Dm/cma  

## 2022-08-08 ENCOUNTER — Other Ambulatory Visit: Payer: Self-pay | Admitting: *Deleted

## 2022-08-08 DIAGNOSIS — I83899 Varicose veins of unspecified lower extremities with other complications: Secondary | ICD-10-CM

## 2022-08-08 NOTE — Progress Notes (Signed)
Requested by:  Haydee Salter, MD [place],  [place] [address]  Reason for consultation: varicose veins    History of Present Illness   Katherine Powell is a 88 y.o. (1932/11/29) female who presents for evaluation of varicose veins. She says she has had noticeable veins for about 20 + years. However, over past approximately 6 years she has noticed a large bulging vein behind her left knee. She had mentioned this to her Cardiologist who recommended she have them evaluated with concerns of bleeding. She denies any aching, bleeding, itching, burning, stinging, pain throbbing, heaviness or tiredness in her legs. She does occasionally get a little swelling around her ankles and feet but this is improved with elevation. She also wears knee high compression stockings frequently which helps control the swelling. She has family history of venous disease in her father. No history of DVT. 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 She still walks and exercises regularly.  Venous symptoms include: spider veins, swelling Onset/duration:  20+ years  Occupation:  retired Marine scientist Aggravating factors:  standing, prolonged ambulation Alleviating factors: elevation, compression  Compression:  yes Helps:  yes Pain medications:  none Previous vein procedures:  none History of DVT:  no  Past Medical History:  Diagnosis Date   Acne rosacea 04/14/2021   Allergy    Anxiety    Arrhythmia    atrial fibrillation/  on    Arthritis    Atrial fibrillation ([facility])    Breast cancer ([facility]) 11/09/13   right upper outer   Contact lens/glasses fitting    contact rt eye   Hx of radiation therapy 12/15/13- 01/11/14   right breast 4250 cGy in 17 sessions, (hypo-fractionated), no boost   Hypertension    Postmenopausal    Rosacea    Seasonal allergies    Sleep disturbance    Thyroid disease    Urinary incontinence     Past Surgical History:  Procedure  Laterality Date   ABDOMINAL HYSTERECTOMY  1973   endometriosis   [place]     lumb-2005   BILATERAL OOPHORECTOMY     benign tumor on ovary   BREAST LUMPECTOMY WITH NEEDLE LOCALIZATION Right 11/09/2013   Procedure: BREAST LUMPECTOMY WITH NEEDLE LOCALIZATION;  Surgeon: Harl Bowie, MD;  Location: [facility];  Service: General;  Laterality: Right;   CATARACT EXTRACTION  2013   both   CYSTOCELE REPAIR  2008   EYE SURGERY     cataracts-plugs for eyes   RECTOCELE REPAIR  2008   TONSILLECTOMY      Social History   Socioeconomic History   Marital status: Married    Spouse name: Not on file   Number of children: 3   Years of education: Not on file   Highest education level: Not on file  Occupational History   Not on file  Tobacco Use   Smoking status: Never   Smokeless tobacco: Never  Vaping Use   Vaping Use: Never used  Substance and Sexual Activity   Alcohol use: Yes    Comment: socially   Drug use: No   Sexual activity: Never    Comment: menarche age 78, P78, first live birth age 38,  HRT x 30 years  Other Topics Concern   Not on file  Social History Narrative   2nd marriage.   First husband is deceased.  7 grandchildren   7 great grandchildren   Social Determinants of Health   Financial Resource Strain: Low Risk  (05/15/2022)   Overall Financial Resource Strain (CARDIA)    Difficulty of Paying Living Expenses: Not hard at all  Food Insecurity: No Food Insecurity (05/15/2022)   Hunger Vital Sign    Worried About Running Out of Food in the Last Year: Never true    Ran Out of Food in the Last Year: Never true  Transportation Needs: No Transportation Needs (05/15/2022)   PRAPARE - Hydrologist (Medical): No    Lack of Transportation (Non-Medical): No  Physical Activity: Insufficiently Active (05/15/2022)   Exercise Vital Sign    Days of Exercise per Week: 3 days    Minutes of Exercise per  Session: 40 min  Stress: No Stress Concern Present (05/15/2022)   [place]    Feeling of Stress : Not at all  Social Connections: [place] (05/15/2022)   Social Connection and Isolation Panel [NHANES]    Frequency of Communication with Friends and Family: Three times a week    Frequency of Social Gatherings with Friends and Family: Three times a week    Attends Religious Services: More than 4 times per year    Active Member of Clubs or Organizations: Yes    Attends Archivist Meetings: More than 4 times per year    Marital Status: Married  Human resources officer Violence: Not At Risk (05/15/2022)   Humiliation, Afraid, Rape, and Kick questionnaire    Fear of Current or Ex-Partner: No    Emotionally Abused: No    Physically Abused: No    Sexually Abused: No    Family History  Problem Relation Age of Onset   Alzheimer's disease Mother    Stroke Father    Heart attack Neg Hx     Current Outpatient Medications  Medication Sig Dispense Refill   acetaminophen (TYLENOL) 650 MG CR tablet Take 650 mg by mouth every 8 (eight) hours as needed for pain. Reported on 01/19/2016     ALPRAZolam (XANAX) 0.5 MG tablet Take 0.5 tablets (0.25 mg total) by mouth 2 (two) times daily as needed for anxiety. 30 tablet 0   apixaban (ELIQUIS) 5 MG TABS tablet TAKE 1 TABLET TWICE A DAY 180 tablet 3   B Complex-C (SUPER B COMPLEX PO) Take 1 tablet by mouth every morning.     cycloSPORINE (RESTASIS) 0.05 % ophthalmic emulsion Place 1 drop into both eyes 2 (two) times daily as needed (dry eyes).     diltiazem (CARDIZEM CD) 180 MG 24 hr capsule Take 1 capsule (180 mg total) by mouth daily. 90 capsule 3   fexofenadine (ALLEGRA) 180 MG tablet Take 180 mg by mouth daily as needed (seasonal allergies).     flecainide (TAMBOCOR) 100 MG tablet Take 1 tablet (100 mg total) by mouth 2 (two) times daily. 180 tablet 3   Glucosamine HCl-MSM  (GLUCOSAMINE-MSM PO) Take 1 tablet by mouth 2 (two) times daily.     melatonin 5 MG TABS Take 5 mg by mouth at bedtime as needed (sleep).     Multiple Vitamins-Minerals (HAIR/SKIN/NAILS/BIOTIN) TABS Take 1 tablet by mouth 2 (two) times daily.     Multiple Vitamins-Minerals (PRESERVISION AREDS) TABS Take 1 tablet by mouth 2 (two) times daily.     prednisoLONE acetate (PRED FORTE) 1 % ophthalmic suspension Place 1 drop into the right eye 2 (two)  times daily.     sodium chloride (OCEAN) 0.65 % nasal spray Place 1 spray into the nose 2 (two) times daily as needed for congestion. Reported on 01/19/2016     venlafaxine XR (EFFEXOR XR) 37.5 MG 24 hr capsule Take 1 capsule (37.5 mg total) by mouth daily with breakfast. 90 capsule 3   Zinc 50 MG TABS Take 50 mg by mouth at bedtime.     No current facility-administered medications for this visit.    Allergies  Allergen Reactions   Codeine Nausea Only   Gabapentin Other (See Comments)    Makes her very sleepy    REVIEW OF SYSTEMS (negative unless checked):   Cardiac:  '[]'$  Chest pain or chest pressure? '[]'$  Shortness of breath upon activity? '[]'$  Shortness of breath when lying flat? '[]'$  Irregular heart rhythm?  Vascular:  '[]'$  Pain in calf, thigh, or hip brought on by walking? '[]'$  Pain in feet at night that wakes you up from your sleep? '[]'$  Blood clot in your veins? '[]'$  Leg swelling?  Pulmonary:  '[]'$  Oxygen at home? '[]'$  Productive cough? '[]'$  Wheezing?  Neurologic:  '[]'$  Sudden weakness in arms or legs? '[]'$  Sudden numbness in arms or legs? '[]'$  Sudden onset of difficult speaking or slurred speech? '[]'$  Temporary loss of vision in one eye? '[]'$  Problems with dizziness?  Gastrointestinal:  '[]'$  Blood in stool? '[]'$  Vomited blood?  Genitourinary:  '[]'$  Burning when urinating? '[]'$  Blood in urine?  Psychiatric:  '[]'$  Major depression  Hematologic:  '[]'$  Bleeding problems? '[]'$  Problems with blood clotting?  Dermatologic:  '[]'$  Rashes or  ulcers?  Constitutional:  '[]'$  Fever or chills?  Ear/Nose/Throat:  '[]'$  Change in hearing? '[]'$  Nose bleeds? '[]'$  Sore throat?  Musculoskeletal:  '[]'$  Back pain? '[]'$  Joint pain? '[]'$  Muscle pain?   Physical Examination     Vitals:   08/09/22 1335  BP: (!) 151/84  Pulse: 78  Temp: 97.9 F (36.6 C)  TempSrc: Temporal  SpO2: 98%  Weight: 157 lb 1.6 oz (71.3 kg)  Height: '5\' 6"'$  (1.676 m)   Body mass index is 25.36 kg/m.  General:  WDWN in NAD; vital signs documented above Gait: Normal HENT: WNL, normocephalic Pulmonary: normal non-labored breathing , without wheezing Cardiac: regular HR, without  Murmurs  without carotid bruit Vascular Exam/Pulses: 2+ radial pulses, 2+ popliteal, DP and PT pulses bilaterally Extremities: with varicose veins, with reticular veins, without edema, without stasis pigmentation, without lipodermatosclerosis, without ulcers   Musculoskeletal: no muscle wasting or atrophy  Neurologic: A&O X 3;  No focal weakness or paresthesias are detected Psychiatric:  The pt has Normal affect.  Non-invasive Vascular Imaging   BLE Venous Insufficiency Duplex (08/09/22):  LLE: No DVT and SVT GSV reflux at  [facility] GSV diameter 0.17-0.38 cm SSV reflux popliteal fossa and mid calf CFV, FV, popliteal deep venous reflux   Medical Decision Making   Katherine Powell is a 88 y.o. female who presents with: RLE chronic venous insufficiency with varicose veins. She has a cluster of varicose veins behind left knee that are at risk for ulceration/ bleeding. Her duplex today shows Deep and superficial venous insufficiency. No DVT or SVT. Her veins overall are very small so she is not a candidate for venous ablation. Based on the patient's history and examination, I recommend: daily elevation, knee high compression stockings, exercise, refraining from prolonged sitting or standing. I discussed with the patient the use of her 15-20 mm knee high compression stockings. She was  measured and purchased  several pairs at today's visit I discussed possible surgical excision/ stripping of the varicose veins in her left popliteal fossa vs sclerotherapy. At this time she would like to just consider the injections. She understands that these are considered cosmetic and will be out of pocket expense.  I provided her with RN Ernesta Amble card to contact regarding arranging sclerotherapy injections She can otherwise follow up with [place] as needed if she has any new or concerning symptoms Thank you for allowing [place] to participate in this patient's care.   Karoline Caldwell, PA-C [facility] Office: [phone]  08/09/2022, 2:21 PM  Clinic MD: Scot Dock

## 2022-08-09 ENCOUNTER — Ambulatory Visit (INDEPENDENT_AMBULATORY_CARE_PROVIDER_SITE_OTHER): Payer: Medicare Other | Admitting: Physician Assistant

## 2022-08-09 ENCOUNTER — Ambulatory Visit (HOSPITAL_COMMUNITY)
Admission: RE | Admit: 2022-08-09 | Discharge: 2022-08-09 | Disposition: A | Discharge status: Home / Self Care | Payer: Medicare Other | Source: Ambulatory Visit | Attending: Vascular Surgery | Admitting: Vascular Surgery

## 2022-08-09 VITALS — BP 151/84 | HR 78 | Temp 97.9°F | Ht 66.0 in | Wt 157.1 lb

## 2022-08-09 DIAGNOSIS — I872 Venous insufficiency (chronic) (peripheral): Secondary | ICD-10-CM | POA: Diagnosis not present

## 2022-08-09 DIAGNOSIS — I83899 Varicose veins of unspecified lower extremities with other complications: Secondary | ICD-10-CM

## 2022-08-10 ENCOUNTER — Telehealth: Payer: Self-pay

## 2022-08-10 NOTE — Telephone Encounter (Signed)
Pt called about sclerotherapy injection after seeing APP in office yesterday. She has an area behind her knee that she states is about 2 inches and she feels it is shrinking. It is unclear what the area might be, however, she feels it is vein and is interested in injecting it. The area has never bled. She is going to schedule an appt with her dermatologist to see if they are able to determine what it is and will f/u with [place] after this appt.

## 2022-08-15 NOTE — Telephone Encounter (Signed)
Lft VM to rtn call. Dm/cma  

## 2022-08-23 DIAGNOSIS — Z23 Encounter for immunization: Secondary | ICD-10-CM | POA: Diagnosis not present

## 2022-08-27 ENCOUNTER — Other Ambulatory Visit: Payer: Self-pay

## 2022-08-27 MED ORDER — DILTIAZEM HCL ER COATED BEADS 180 MG PO CP24: 180.0000 mg | ORAL_CAPSULE | Freq: Every day | ORAL | 3 refills | Status: DC

## 2022-08-27 MED ORDER — DILTIAZEM HCL ER COATED BEADS 180 MG PO CP24: 180.0000 mg | ORAL_CAPSULE | Freq: Every day | ORAL | 0 refills | Status: DC

## 2022-08-27 NOTE — Telephone Encounter (Signed)
Pt's medication was sent to pt's pharmacy as requested. Confirmation received.  °

## 2022-08-30 DIAGNOSIS — Z85828 Personal history of other malignant neoplasm of skin: Secondary | ICD-10-CM | POA: Diagnosis not present

## 2022-08-30 DIAGNOSIS — L821 Other seborrheic keratosis: Secondary | ICD-10-CM | POA: Diagnosis not present

## 2022-09-21 ENCOUNTER — Ambulatory Visit (INDEPENDENT_AMBULATORY_CARE_PROVIDER_SITE_OTHER): Payer: Medicare Other | Admitting: Nurse Practitioner

## 2022-09-21 ENCOUNTER — Encounter: Payer: Self-pay | Admitting: Nurse Practitioner

## 2022-09-21 ENCOUNTER — Ambulatory Visit (INDEPENDENT_AMBULATORY_CARE_PROVIDER_SITE_OTHER): Payer: Medicare Other

## 2022-09-21 VITALS — BP 126/60 | HR 71 | Temp 97.4°F | Ht 66.0 in | Wt 165.6 lb

## 2022-09-21 DIAGNOSIS — W101XXA Fall (on)(from) sidewalk curb, initial encounter: Secondary | ICD-10-CM

## 2022-09-21 DIAGNOSIS — S299XXA Unspecified injury of thorax, initial encounter: Secondary | ICD-10-CM | POA: Diagnosis not present

## 2022-09-21 DIAGNOSIS — M79621 Pain in right upper arm: Secondary | ICD-10-CM

## 2022-09-21 DIAGNOSIS — R0789 Other chest pain: Secondary | ICD-10-CM

## 2022-09-21 DIAGNOSIS — J984 Other disorders of lung: Secondary | ICD-10-CM | POA: Diagnosis not present

## 2022-09-21 DIAGNOSIS — M419 Scoliosis, unspecified: Secondary | ICD-10-CM | POA: Diagnosis not present

## 2022-09-21 NOTE — Progress Notes (Unsigned)
Initial visit for left upper chest wall pain Bb marks most painful area. Pt did state her soreness was getting better

## 2022-09-21 NOTE — Progress Notes (Unsigned)
Established Patient Visit  Patient: Katherine Powell   DOB: March 06, 1932   88 y.o. Female  MRN: [id number] Visit Date: 09/21/2022  Subjective:    Chief Complaint  Patient presents with   Acute Visit    Had a Fall twice while on a trip first fall: 09/05/22, second fall 09/17/22. Has been having some chest soreness/ back pain due to the fall  No other concerns    Fall The accident occurred 5 to 7 days ago. The fall occurred while walking. She fell from an unknown height. She landed on [place]. There was no blood loss. Point of impact: left chest wall and right upper arm. Pain location: sternum, left chest wall and right upper arm. The pain is moderate. The symptoms are aggravated by pressure on injury. Associated symptoms include numbness. Pertinent negatives include no abdominal pain, bowel incontinence, fever, headaches, hearing loss, hematuria, loss of consciousness, nausea, tingling, visual change or vomiting. She has tried nothing for the symptoms.  [place] the airport: tripped over her luggage, impact on left side, left chest wall pain since then 2nd fall 09/17/22, stepped on a cub while leaning over, felt dizzy and fell on right side, left upper arm pain and bruising. Also has a small skin tear on right forearm. She also reports fall at home prior to her trip to [place], she tripped on stairs at home. [place] forward on both hands, denies any injury from this fall. Reports hx of intermittent dizziness when she leans forward. Ongoing for several months. Denies any vertigo.she states she stay active by participating in the [facility] exercise program 3x/week to strengthen legs, core strength and gait. She decline need for a Cane  Reviewed medical, surgical, and social history today  Medications: Outpatient Medications Prior to Visit  Medication Sig   acetaminophen (TYLENOL) 650 MG CR tablet Take 650 mg by mouth every 8 (eight) hours as needed for pain. Reported on  01/19/2016   ALPRAZolam (XANAX) 0.5 MG tablet Take 0.5 tablets (0.25 mg total) by mouth 2 (two) times daily as needed for anxiety.   apixaban (ELIQUIS) 5 MG TABS tablet TAKE 1 TABLET TWICE A DAY   B Complex-C (SUPER B COMPLEX PO) Take 1 tablet by mouth every morning.   cycloSPORINE (RESTASIS) 0.05 % ophthalmic emulsion Place 1 drop into both eyes 2 (two) times daily as needed (dry eyes).   diltiazem (CARDIZEM CD) 180 MG 24 hr capsule Take 1 capsule (180 mg total) by mouth daily.   fexofenadine (ALLEGRA) 180 MG tablet Take 180 mg by mouth daily as needed (seasonal allergies).   flecainide (TAMBOCOR) 100 MG tablet Take 1 tablet (100 mg total) by mouth 2 (two) times daily.   Glucosamine HCl-MSM (GLUCOSAMINE-MSM PO) Take 1 tablet by mouth 2 (two) times daily.   melatonin 5 MG TABS Take 5 mg by mouth at bedtime as needed (sleep).   Multiple Vitamins-Minerals (HAIR/SKIN/NAILS/BIOTIN) TABS Take 1 tablet by mouth 2 (two) times daily.   Multiple Vitamins-Minerals (PRESERVISION AREDS) TABS Take 1 tablet by mouth 2 (two) times daily.   prednisoLONE acetate (PRED FORTE) 1 % ophthalmic suspension Place 1 drop into the right eye 2 (two) times daily.   sodium chloride (OCEAN) 0.65 % nasal spray Place 1 spray into the nose 2 (two) times daily as needed for congestion. Reported on 01/19/2016   venlafaxine XR (EFFEXOR XR) 37.5 MG 24 hr capsule Take 1 capsule (  37.5 mg total) by mouth daily with breakfast.   Zinc 50 MG TABS Take 50 mg by mouth at bedtime.   No facility-administered medications prior to visit.   Reviewed past medical and social history.   ROS per HPI above  {Show previous labs (optional):23779}    Objective:  BP 126/60 (BP Location: Right Arm, Patient Position: Sitting, Cuff Size: Normal)   Pulse 71   Temp (!) 97.4 F (36.3 C) (Temporal)   Ht '5\' 6"'$  (1.676 m)   Wt 165 lb 9.6 oz (75.1 kg)   SpO2 96%   BMI 26.73 kg/m      Physical Exam Cardiovascular:     Rate and Rhythm: Normal rate.      Pulses: Normal pulses.  Pulmonary:     Effort: Pulmonary effort is normal.     Breath sounds: Normal breath sounds.  Chest:     Chest wall: Tenderness present. No crepitus.  Musculoskeletal:        General: Tenderness present.     Right shoulder: Normal.     Left shoulder: Normal.     Right upper arm: Tenderness and bony tenderness present. No swelling.     Left upper arm: Normal.     Right elbow: Normal.     Left elbow: Normal.     Right forearm: Normal.     Left forearm: Normal.     Right wrist: Normal.     Right hand: Normal.     Left hand: Normal.     Cervical back: Normal, normal range of motion and neck supple.     Thoracic back: Normal.  Skin:    Findings: Bruising present.  Neurological:     Mental Status: She is alert and oriented to person, place, and time.     Cranial Nerves: No cranial nerve deficit.     Motor: No weakness.     Gait: Gait normal.     No results found for any visits on 09/21/22.    Assessment & Plan:    Problem List Items Addressed This Visit   None Visit Diagnoses     Fall (on)(from) sidewalk curb, initial encounter    -  Primary   Relevant Orders   DG Humerus Right   DG Ribs Unilateral W/Chest Left   Left-sided chest wall pain       Relevant Orders   DG Ribs Unilateral W/Chest Left   Pain in right upper arm       Relevant Orders   DG Humerus Right      Return if symptoms worsen or fail to improve.     Wilfred Lacy, NP

## 2022-09-21 NOTE — Patient Instructions (Signed)
Go to lab. I recommend use of cane to stabilize gait. Avoid bending forward and change positions slowly.  Fall Prevention in the Home, Adult Falls can cause injuries and can happen to people of all ages. There are many things you can do to make your home safe and to help prevent falls. Ask for help when making these changes. What actions can I take to prevent falls? General Instructions Use good lighting in all rooms. Replace any light bulbs that burn out. Turn on the lights in dark areas. Use night-lights. Keep items that you use often in easy-to-reach places. Lower the shelves around your home if needed. Set up your furniture so you have a clear path. Avoid moving your furniture around. Do not have throw rugs or other things on the floor that can make you trip. Avoid walking on wet floors. If any of your floors are uneven, fix them. Add color or contrast paint or tape to clearly mark and help you see: Grab bars or handrails. First and last steps of staircases. Where the edge of each step is. If you use a stepladder: Make sure that it is fully opened. Do not climb a closed stepladder. Make sure the sides of the stepladder are locked in place. Ask someone to hold the stepladder while you use it. Know where your pets are when moving through your home. What can I do in the bathroom?     Keep the floor dry. Clean up any water on the floor right away. Remove soap buildup in the tub or shower. Use nonskid mats or decals on the floor of the tub or shower. Attach bath mats securely with double-sided, nonslip rug tape. If you need to sit down in the shower, use a plastic, nonslip stool. Install grab bars by the toilet and in the tub and shower. Do not use towel bars as grab bars. What can I do in the bedroom? Make sure that you have a light by your bed that is easy to reach. Do not use any sheets or blankets for your bed that hang to the floor. Have a firm chair with side arms that you  can use for support when you get dressed. What can I do in the kitchen? Clean up any spills right away. If you need to reach something above you, use a step stool with a grab bar. Keep electrical cords out of the way. Do not use floor polish or wax that makes floors slippery. What can I do with my stairs? Do not leave any items on the stairs. Make sure that you have a light switch at the top and the bottom of the stairs. Make sure that there are handrails on both sides of the stairs. Fix handrails that are broken or loose. Install nonslip stair treads on all your stairs. Avoid having throw rugs at the top or bottom of the stairs. Choose a carpet that does not hide the edge of the steps on the stairs. Check carpeting to make sure that it is firmly attached to the stairs. Fix carpet that is loose or worn. What can I do on the outside of my home? Use bright outdoor lighting. Fix the edges of walkways and driveways and fix any cracks. Remove anything that might make you trip as you walk through a door, such as a raised step or threshold. Trim any bushes or trees on paths to your home. Check to see if handrails are loose or broken and that both sides of  all steps have handrails. Install guardrails along the edges of any raised decks and porches. Clear paths of anything that can make you trip, such as tools or rocks. Have leaves, snow, or ice cleared regularly. Use sand or salt on paths during winter. Clean up any spills in your garage right away. This includes grease or oil spills. What other actions can I take? Wear shoes that: Have a low heel. Do not wear high heels. Have rubber bottoms. Feel good on your feet and fit well. Are closed at the toe. Do not wear open-toe sandals. Use tools that help you move around if needed. These include: Canes. Walkers. Scooters. Crutches. Review your medicines with your doctor. Some medicines can make you feel dizzy. This can increase your chance of  falling. Ask your doctor what else you can do to help prevent falls. Where to find more information Centers for Disease Control and Prevention, [employer]: http://www.wolf.info/ National Institute on Aging: http://kim-miller.com/ Contact a doctor if: You are afraid of falling at home. You feel weak, drowsy, or dizzy at home. You fall at home. Summary There are many simple things that you can do to make your home safe and to help prevent falls. Ways to make your home safe include removing things that can make you trip and installing grab bars in the bathroom. Ask for help when making these changes in your home. This information is not intended to replace advice given to you by your health care provider. Make sure you discuss any questions you have with your health care provider. Document Revised: 08/21/2021 Document Reviewed: 06/22/2020 Elsevier Patient Education  [place].

## 2022-09-21 NOTE — Progress Notes (Unsigned)
Initial visit for RT upper arm pain after a fall 1 week ago.

## 2022-09-25 ENCOUNTER — Telehealth: Payer: Self-pay | Admitting: Family Medicine

## 2022-09-25 NOTE — Telephone Encounter (Signed)
LVM , adv pt to call back

## 2022-09-25 NOTE — Telephone Encounter (Signed)
Pt is wanting a cb concerning her most recent x-rays taken. Please advise pt @ [phone].

## 2022-09-26 ENCOUNTER — Telehealth: Payer: Self-pay | Admitting: Family Medicine

## 2022-09-26 NOTE — Telephone Encounter (Signed)
Pt dropped by office with papers in a folder to give to Dr. Gena Fray, she stated there was nothing to fill out. I opened it when she left and there is an [employer] form to fill out. Please advise pt when complete @ [phone]. I will attach green form, but she did not fill it out. I placed in Dr. Juliane Lack folder up front. I called pt and left a vm letting her know there may be a charge included.

## 2022-09-26 NOTE — Telephone Encounter (Signed)
Form placed on providers desk to fill out upon returning on 10/01/22.  Dm/cma

## 2022-10-02 NOTE — Telephone Encounter (Signed)
Spoke to patient, she will cal [place] back to schedule a f/u appointment with Dr Gena Fray so that the form can be filled out.  Dm/cma

## 2022-10-03 NOTE — Telephone Encounter (Signed)
Scheduled for 10/11/22.  Form placed on providers desk.  Dm/cma

## 2022-10-07 ENCOUNTER — Other Ambulatory Visit: Payer: Self-pay | Admitting: Interventional Cardiology

## 2022-10-11 ENCOUNTER — Ambulatory Visit (INDEPENDENT_AMBULATORY_CARE_PROVIDER_SITE_OTHER): Payer: Medicare Other | Admitting: Family Medicine

## 2022-10-11 ENCOUNTER — Encounter: Payer: Self-pay | Admitting: Family Medicine

## 2022-10-11 VITALS — BP 130/74 | HR 68 | Temp 97.6°F | Ht 66.0 in | Wt 159.0 lb

## 2022-10-11 DIAGNOSIS — I951 Orthostatic hypotension: Secondary | ICD-10-CM

## 2022-10-11 DIAGNOSIS — R296 Repeated falls: Secondary | ICD-10-CM

## 2022-10-11 DIAGNOSIS — M159 Polyosteoarthritis, unspecified: Secondary | ICD-10-CM

## 2022-10-11 NOTE — Progress Notes (Signed)
[facility] [address] [place] [address] Dept: [phone] Dept Fax: [phone]  Office Visit  Subjective:    Patient ID: Katherine Powell, female    DOB: 20-Jan-1932, 88 y.o..   MRN: [id number]  Chief Complaint  Patient presents with   Follow-up    F/u to fill out paper work.      History of Present Illness:  Patient is in today to discuss recent falls. She notes that she has had three falls in the past 2 weeks. The first occurred at the airport in [place]. She and her husband were there having been on a [place] cruise. She notes she got tangled up in her luggage. She ended up with a large bruise to her right upper arm from her fall. She had two additional falls after returning home, which impacted caused some abrasions. She has a history of lumbar arthritis and degenerative disc disease and osteoarthritis of multiple joints, including her knees. As well, she is on diltiazem for treatment of hypertension and control of ehr heart rate due to atrial fibrillation. She does not some "foggy" feeling when she bends over and straightens up.  Past Medical History: Patient Active Problem List   Diagnosis Date Noted   Arthropathy of lumbar facet joint 02/13/2022   Osteoarthritis of both hands 08/16/2021   Long term (current) use of anticoagulants 04/14/2021   Personal history of colonic polyps 04/14/2021   Mixed incontinence 04/14/2021   History of cornea transplant 04/14/2021   Essential hypertension 04/14/2021   Degeneration of lumbar intervertebral disc 04/14/2021   Corneal transplant failure, right eye 04/14/2021   Chronic kidney disease, stage 3a ([place]) 04/14/2021   Anxiety 04/14/2021   Allergic rhinitis 04/14/2021   Presbycusis of both ears 04/14/2021   Red blood cell antibody positive 12/05/2018   Multinodular goiter (nontoxic) 02/11/2017   Spinal stenosis of lumbar region 10/31/2016   Degenerative  spondylolisthesis 10/11/2016   Osteopenia 07/18/2016   Lumbar radiculopathy 01/05/2015   Paroxysmal atrial fibrillation ([place]) 12/29/2014   Postmenopausal estrogen deficiency 01/27/2014   Malignant neoplasm of upper-outer quadrant of right breast in female, estrogen receptor positive ([place]) 11/17/2013   Past Surgical History:  Procedure Laterality Date   ABDOMINAL HYSTERECTOMY  1973   endometriosis   APPENDECTOMY  1954   BACK SURGERY     lumb-2005   BILATERAL OOPHORECTOMY     benign tumor on ovary   BREAST LUMPECTOMY WITH NEEDLE LOCALIZATION Right 11/09/2013   Procedure: BREAST LUMPECTOMY WITH NEEDLE LOCALIZATION;  Surgeon: Harl Bowie, MD;  Location: [facility];  Service: General;  Laterality: Right;   CATARACT EXTRACTION  2013   both   CYSTOCELE REPAIR  2008   EYE SURGERY     cataracts-plugs for eyes   RECTOCELE REPAIR  2008   TONSILLECTOMY     Family History  Problem Relation Age of Onset   Alzheimer's disease Mother    Stroke Father    Heart attack Neg Hx    Outpatient Medications Prior to Visit  Medication Sig Dispense Refill   acetaminophen (TYLENOL) 650 MG CR tablet Take 650 mg by mouth every 8 (eight) hours as needed for pain. Reported on 01/19/2016     ALPRAZolam (XANAX) 0.5 MG tablet Take 0.5 tablets (0.25 mg total) by mouth 2 (two) times daily as needed for anxiety. 30 tablet 0   apixaban (ELIQUIS) 5 MG TABS tablet TAKE 1 TABLET TWICE A DAY 180 tablet 3  B Complex-C (SUPER B COMPLEX PO) Take 1 tablet by mouth every morning.     cycloSPORINE (RESTASIS) 0.05 % ophthalmic emulsion Place 1 drop into both eyes 2 (two) times daily as needed (dry eyes).     diltiazem (CARDIZEM CD) 180 MG 24 hr capsule Take 1 capsule (180 mg total) by mouth daily. 30 capsule 0   fexofenadine (ALLEGRA) 180 MG tablet Take 180 mg by mouth daily as needed (seasonal allergies).     flecainide (TAMBOCOR) 100 MG tablet TAKE 1 TABLET TWICE A DAY 180 tablet 3   Glucosamine  HCl-MSM (GLUCOSAMINE-MSM PO) Take 1 tablet by mouth 2 (two) times daily.     melatonin 5 MG TABS Take 5 mg by mouth at bedtime as needed (sleep).     Multiple Vitamins-Minerals (HAIR/SKIN/NAILS/BIOTIN) TABS Take 1 tablet by mouth 2 (two) times daily.     Multiple Vitamins-Minerals (PRESERVISION AREDS) TABS Take 1 tablet by mouth 2 (two) times daily.     prednisoLONE acetate (PRED FORTE) 1 % ophthalmic suspension Place 1 drop into the right eye 2 (two) times daily.     sodium chloride (OCEAN) 0.65 % nasal spray Place 1 spray into the nose 2 (two) times daily as needed for congestion. Reported on 01/19/2016     venlafaxine XR (EFFEXOR XR) 37.5 MG 24 hr capsule Take 1 capsule (37.5 mg total) by mouth daily with breakfast. 90 capsule 3   Zinc 50 MG TABS Take 50 mg by mouth at bedtime.     No facility-administered medications prior to visit.   Allergies  Allergen Reactions   Codeine Nausea Only   Gabapentin Other (See Comments)    Makes her very sleepy   Objective:   Today's Vitals   10/11/22 1043  BP: 130/74  Pulse: 68  Temp: 97.6 F (36.4 C)  TempSrc: Temporal  SpO2: 92%  Weight: 159 lb (72.1 kg)  Height: '5\' 6"'$  (1.676 m)   Body mass index is 25.66 kg/m.   General: Well developed, well nourished. No acute distress. Psych: Alert and oriented. Normal mood and affect.  There are no preventive care reminders to display for this patient.    Assessment & Plan:   1. Recurrent falls [name] has had several recent falls. The etiology of this is likely multifactorial, due to lumbar and knee arthritis, age-related physical deconditioning, and orthostasis with underlying heart disease. I feel it is very prudent for her to decide not to go on her scheduled cruise to [place]. Ship environments can be difficult due to pitch and roll of the ship as well as increased trip hazards (bulkheads, uneven surfaces, etc.). I completed the form she brought with her related to this.  2. Primary  osteoarthritis involving multiple joints Stable. We would consider PT for strengthening if she continues to have issues.  3. Orthostasis Blood pressure is in an acceptable range at present. I will have her continue her diltiazemCD 180 mg daily. I encouraged her to keep well hydrated. She should continue to pause after standing up to make sure she is steady before walking off.   Return for As scheduled.   Haydee Salter, MD

## 2022-10-11 NOTE — Telephone Encounter (Signed)
Form filled out and given to patient at the end of today's appointment. Dm/cma

## 2022-11-12 DIAGNOSIS — H52213 Irregular astigmatism, bilateral: Secondary | ICD-10-CM | POA: Diagnosis not present

## 2022-11-12 DIAGNOSIS — H179 Unspecified corneal scar and opacity: Secondary | ICD-10-CM | POA: Diagnosis not present

## 2022-11-12 DIAGNOSIS — H18211 Corneal edema secondary to contact lens, right eye: Secondary | ICD-10-CM | POA: Diagnosis not present

## 2022-11-20 ENCOUNTER — Other Ambulatory Visit: Payer: Self-pay | Admitting: Family Medicine

## 2022-12-27 ENCOUNTER — Ambulatory Visit (INDEPENDENT_AMBULATORY_CARE_PROVIDER_SITE_OTHER): Payer: Medicare Other | Admitting: Family Medicine

## 2022-12-27 ENCOUNTER — Encounter: Payer: Self-pay | Admitting: Family Medicine

## 2022-12-27 VITALS — BP 146/70 | HR 63 | Temp 97.7°F | Ht 66.0 in | Wt 162.6 lb

## 2022-12-27 DIAGNOSIS — N1831 Chronic kidney disease, stage 3a: Secondary | ICD-10-CM | POA: Diagnosis not present

## 2022-12-27 DIAGNOSIS — M19042 Primary osteoarthritis, left hand: Secondary | ICD-10-CM | POA: Diagnosis not present

## 2022-12-27 DIAGNOSIS — F419 Anxiety disorder, unspecified: Secondary | ICD-10-CM | POA: Diagnosis not present

## 2022-12-27 DIAGNOSIS — I10 Essential (primary) hypertension: Secondary | ICD-10-CM | POA: Diagnosis not present

## 2022-12-27 DIAGNOSIS — T868411 Corneal transplant failure, right eye: Secondary | ICD-10-CM

## 2022-12-27 DIAGNOSIS — M19041 Primary osteoarthritis, right hand: Secondary | ICD-10-CM

## 2022-12-27 LAB — BASIC METABOLIC PANEL
BUN: 22 mg/dL (ref 6–23)
CO2: 29 mEq/L (ref 19–32)
Calcium: 9.6 mg/dL (ref 8.4–10.5)
Chloride: 101 mEq/L (ref 96–112)
Creatinine, Ser: 0.96 mg/dL (ref 0.40–1.20)
GFR: 52.88 mL/min — ABNORMAL LOW (ref >60.00)
Glucose, Bld: 92 mg/dL (ref 70–99)
Potassium: 4.6 mEq/L (ref 3.5–5.1)
Sodium: 138 mEq/L (ref 135–145)

## 2022-12-27 MED ORDER — VENLAFAXINE HCL ER 75 MG PO CP24: 75.0000 mg | ORAL_CAPSULE | Freq: Every day | ORAL | 3 refills | Status: DC

## 2022-12-27 NOTE — Assessment & Plan Note (Signed)
Blood pressure is mildly elevated today. I recommend she monitor this at home over the next week. if this stays persistently high, we would consider a medication change. For now, she will continue diltiazem CD 180 mg daily

## 2022-12-27 NOTE — Assessment & Plan Note (Signed)
I will increase her venlafaxine to 75 mg daily.

## 2022-12-27 NOTE — Progress Notes (Signed)
[facility] [address] [place] [address] Dept: [phone] Dept Fax: [phone]  Chronic Care Office Visit  Subjective:    Patient ID: Katherine Powell, female    DOB: Nov 23, 1932, 89 y.o..   MRN: [id number]  Chief Complaint  Patient presents with   Follow-up    6 month F/U, no concerns    History of Present Illness:  Patient is in today for reassessment of chronic medical issues.  Ms. Charon has a history of hypertension. She is managed on diltiazem CD 180 mg daily . She als has some moderate CKD. She monitors her blood pressure at home and it has been in good control overall. She is not sure why it would be higher today.   Ms. Gardin has a history of anxiety. She was previously taking only Xanax about once a week for this. She is now managed on venlafaxine 37.5 mg daily. She feels this is has improved her anxiety, but wonders about stepping the dose up, due to some persistent symptoms.   Ms. Bassinger has a history of osteoarthritis of the joints of her hands. She notes that her left 3rd finger seems to pop a great deal and she does have some difficulty with ROM.  Ms. Vetter has had a prior right corneal transplant. This has apparently worn out and she is being considered for a repeat exam. She notes it does cause some blurry vision for her. she continues to drive and feels her sight in her left eye is good. She does avoid nighttime driving.  Past Medical History: Patient Active Problem List   Diagnosis Date Noted   Arthropathy of lumbar facet joint 02/13/2022   Osteoarthritis of both hands 08/16/2021   Long term (current) use of anticoagulants 04/14/2021   Personal history of colonic polyps 04/14/2021   Mixed incontinence 04/14/2021   History of cornea transplant 04/14/2021   Essential hypertension 04/14/2021   Degeneration of lumbar intervertebral disc 04/14/2021   Corneal transplant failure, right eye  04/14/2021   Chronic kidney disease, stage 3a ([place]) 04/14/2021   Anxiety 04/14/2021   Allergic rhinitis 04/14/2021   Presbycusis of both ears 04/14/2021   Red blood cell antibody positive 12/05/2018   Multinodular goiter (nontoxic) 02/11/2017   Spinal stenosis of lumbar region 10/31/2016   Degenerative spondylolisthesis 10/11/2016   Osteopenia 07/18/2016   Lumbar radiculopathy 01/05/2015   Paroxysmal atrial fibrillation ([place]) 12/29/2014   Postmenopausal estrogen deficiency 01/27/2014   Malignant neoplasm of upper-outer quadrant of right breast in female, estrogen receptor positive ([place]) 11/17/2013   Past Surgical History:  Procedure Laterality Date   ABDOMINAL HYSTERECTOMY  1973   endometriosis   APPENDECTOMY  1954   BACK SURGERY     lumb-2005   BILATERAL OOPHORECTOMY     benign tumor on ovary   BREAST LUMPECTOMY WITH NEEDLE LOCALIZATION Right 11/09/2013   Procedure: BREAST LUMPECTOMY WITH NEEDLE LOCALIZATION;  Surgeon: Harl Bowie, MD;  Location: [facility];  Service: General;  Laterality: Right;   CATARACT EXTRACTION  2013   both   CYSTOCELE REPAIR  2008   EYE SURGERY     cataracts-plugs for eyes   RECTOCELE REPAIR  2008   TONSILLECTOMY     Family History  Problem Relation Age of Onset   Alzheimer's disease Mother    Stroke Father    Heart attack Neg Hx    Outpatient Medications Prior to Visit  Medication Sig Dispense Refill   acetaminophen (TYLENOL)  650 MG CR tablet Take 650 mg by mouth every 8 (eight) hours as needed for pain. Reported on 01/19/2016     ALPRAZolam (XANAX) 0.5 MG tablet TAKE 1/2 TABLET(0.25 MG) BY MOUTH TWICE DAILY AS NEEDED FOR ANXIETY 30 tablet 2   apixaban (ELIQUIS) 5 MG TABS tablet TAKE 1 TABLET TWICE A DAY 180 tablet 3   B Complex-C (SUPER B COMPLEX PO) Take 1 tablet by mouth every morning.     diltiazem (CARDIZEM CD) 180 MG 24 hr capsule Take 1 capsule (180 mg total) by mouth daily. 30 capsule 0   fexofenadine (ALLEGRA) 180  MG tablet Take 180 mg by mouth daily as needed (seasonal allergies).     flecainide (TAMBOCOR) 100 MG tablet TAKE 1 TABLET TWICE A DAY 180 tablet 3   Glucosamine HCl-MSM (GLUCOSAMINE-MSM PO) Take 1 tablet by mouth 2 (two) times daily.     melatonin 5 MG TABS Take 5 mg by mouth at bedtime as needed (sleep).     Multiple Vitamins-Minerals (HAIR/SKIN/NAILS/BIOTIN) TABS Take 1 tablet by mouth 2 (two) times daily.     Multiple Vitamins-Minerals (PRESERVISION AREDS) TABS Take 1 tablet by mouth 2 (two) times daily.     prednisoLONE acetate (PRED FORTE) 1 % ophthalmic suspension Place 1 drop into the right eye 2 (two) times daily.     sodium chloride (OCEAN) 0.65 % nasal spray Place 1 spray into the nose 2 (two) times daily as needed for congestion. Reported on 01/19/2016     venlafaxine XR (EFFEXOR XR) 37.5 MG 24 hr capsule Take 1 capsule (37.5 mg total) by mouth daily with breakfast. 90 capsule 3   cycloSPORINE (RESTASIS) 0.05 % ophthalmic emulsion Place 1 drop into both eyes 2 (two) times daily as needed (dry eyes). (Patient not taking: Reported on 12/27/2022)     Zinc 50 MG TABS Take 50 mg by mouth at bedtime. (Patient not taking: Reported on 12/27/2022)     No facility-administered medications prior to visit.   Allergies  Allergen Reactions   Codeine Nausea Only   Gabapentin Other (See Comments)    Makes her very sleepy     Objective:   Today's Vitals   12/27/22 1032  BP: (!) 136/92  Pulse: 63  Temp: 97.7 F (36.5 C)  TempSrc: Tympanic  SpO2: 94%  Weight: 162 lb 9.6 oz (73.8 kg)  Height: '5\' 6"'$  (1.676 m)   Body mass index is 26.24 kg/m.   General: Well developed, well nourished. No acute distress. Extremities: Extensive nodular changes to the DUP and PIP joints of both hands, with some deformity and   limited flexion. Psych: Alert and oriented. Normal mood and affect.  Health Maintenance Due  Topic Date Due   COVID-19 Vaccine (9 - 2023-24 season) 11/28/2022     Assessment &  Plan:   Problem List Items Addressed This Visit       Cardiovascular and Mediastinum   Essential hypertension - Primary    Blood pressure is mildly elevated today. I recommend she monitor this at home over the next week. if this stays persistently high, we would consider a medication change. For now, she will continue diltiazem CD 180 mg daily      Relevant Orders   Basic metabolic panel     Musculoskeletal and Integument   Osteoarthritis of both hands    Ms. Horsey has significant OA of both hands, I recommend we have her see Dr. Amedeo Plenty (hand surgery) for evaluation of potential options ofr management.  I did discuss with her about use of a paraffin hand bath for comfort.      Relevant Orders   Ambulatory referral to Orthopedic Surgery     Genitourinary   Chronic kidney disease, stage 3a ([place])    BP is up today. We will reassess annual kidney function.      Relevant Orders   Basic metabolic panel     Other   Corneal transplant failure, right eye    Continue to follow with transplant doctor. We did discuss driving safety. I encouraged her to be mindful of this related to her vision.      Anxiety    I will increase her venlafaxine to 75 mg daily.      Relevant Medications   venlafaxine XR (EFFEXOR XR) 75 MG 24 hr capsule    Return in about 6 months (around 06/27/2023) for Reassessment.   Haydee Salter, MD

## 2022-12-27 NOTE — Assessment & Plan Note (Signed)
Continue to follow with transplant doctor. We did discuss driving safety. I encouraged her to be mindful of this related to her vision.

## 2022-12-27 NOTE — Assessment & Plan Note (Signed)
BP is up today. We will reassess annual kidney function.

## 2022-12-27 NOTE — Assessment & Plan Note (Signed)
Katherine Powell has significant OA of both hands, I recommend we have her see Dr. Amedeo Plenty (hand surgery) for evaluation of potential options ofr management. I did discuss with her about use of a paraffin hand bath for comfort.

## 2023-01-04 DIAGNOSIS — M79642 Pain in left hand: Secondary | ICD-10-CM | POA: Diagnosis not present

## 2023-01-10 ENCOUNTER — Encounter (HOSPITAL_COMMUNITY): Payer: Self-pay | Admitting: *Deleted

## 2023-01-14 DIAGNOSIS — Z947 Corneal transplant status: Secondary | ICD-10-CM | POA: Diagnosis not present

## 2023-01-14 DIAGNOSIS — H179 Unspecified corneal scar and opacity: Secondary | ICD-10-CM | POA: Diagnosis not present

## 2023-01-22 DIAGNOSIS — Z1231 Encounter for screening mammogram for malignant neoplasm of breast: Secondary | ICD-10-CM | POA: Diagnosis not present

## 2023-01-22 LAB — HM MAMMOGRAPHY

## 2023-01-23 ENCOUNTER — Encounter: Payer: Self-pay | Admitting: Family Medicine

## 2023-02-04 ENCOUNTER — Other Ambulatory Visit: Payer: Self-pay | Admitting: *Deleted

## 2023-02-04 DIAGNOSIS — I480 Paroxysmal atrial fibrillation: Secondary | ICD-10-CM

## 2023-02-04 MED ORDER — APIXABAN 5 MG PO TABS: 5.0000 mg | ORAL_TABLET | Freq: Two times a day (BID) | ORAL | 1 refills | Status: DC

## 2023-02-04 NOTE — Telephone Encounter (Signed)
Eliquis '5mg'$  refill request received. Patient is 89 years old, weight-73.8kg, Crea-0.96 on 12/27/22, Diagnosis-Afib, and last seen by Dr. Tamala Julian on 08/01/22 & pending appt with Dr. Johney Frame on 08/06/23. Dose is appropriate based on dosing criteria. Will send in refill to requested pharmacy.

## 2023-02-05 ENCOUNTER — Telehealth: Payer: Self-pay | Admitting: Cardiology

## 2023-02-05 NOTE — Telephone Encounter (Signed)
Patient c/o Palpitations:  High priority if patient c/o lightheadedness, shortness of breath, or chest pain  How long have you had palpitations/irregular HR/ Afib? Are you having the symptoms now?  Afib on and off for the past few weeks on and off No symptoms currently  Are you currently experiencing lightheadedness, SOB or CP?  No   Do you have a history of afib (atrial fibrillation) or irregular heart rhythm?  Yes   Have you checked your BP or HR? (document readings if available):  HR is 76, patient has not checked BP  Are you experiencing any other symptoms?  No

## 2023-02-05 NOTE — Telephone Encounter (Signed)
Pt was calling in to make an appointment for sometime next week to see an APP or Dr. Johney Frame.  Pt is a former Dr. Tamala Julian pt.  Pt states she has a history of PAF.   She states about 2 weeks ago she noticed she went back into afib for 2 consecutive days in a row and then she could feel she went back into sinus rhythm thereafter.  She states during those 2 days she really didn't have any cardiac complaints like chest pain, sob, doe, dizziness, pre-syncopal or syncopal episodes, but she could feel the irregularity in her heart rhythm and slight palpitations.    She states her rates stay controlled and usually in the 70s.  She doesn't monitor her pressures at home.  She states she hasn't had any further episodes.   She states she is compliant with taking all her cardiac medications.   Pt is currently asymptomatic at this time and will be leaving for the beach tomorrow and will return early next week.   Pt voiced she is aware that her afib can come and go, and for the most part, her rates are stable, but she would like to make an appt for sometime next week to further discuss management of her afib, if it returns or worsens.  Pt states she does drink a lot of caffeine, green teas, and coffee, and is going to work on modifying and reducing this daily, for that seems to be a trigger.   Spoke briefly with Dr. Johney Frame about the pt and she agreed to bring her into see an Extender in the next week or so.   Endorsed this to the pt and she stated this works out perfectly, for she will be leaving for [employer] [place] [employer] and will return early next week.   Scheduled the pt to see Richardson Dopp PA-C for next Wednesday 3/13 at 2:45 pm.  Advised her to arrive 15 mins prior to that appt.   Advised the pt to limit her caffeine intake and increase her po water intake.  Advised her to make sure she takes all her cardiac meds with her on vacation.   Advised her continue monitoring her rates.   Advised her to  wear compressions on her travels.   ED precautions provided to the pt if afib returns/worsens/persist between now and her appt next week.   Pt verbalized understanding and agrees with this plan.  Will make Dr. Johney Frame aware of this plan as well.

## 2023-02-13 ENCOUNTER — Encounter: Payer: Self-pay | Admitting: Oncology

## 2023-02-13 ENCOUNTER — Ambulatory Visit: Payer: Medicare Other | Admitting: Student

## 2023-02-13 ENCOUNTER — Ambulatory Visit: Payer: Medicare Other | Attending: Physician Assistant | Admitting: Physician Assistant

## 2023-02-13 ENCOUNTER — Encounter: Payer: Self-pay | Admitting: Physician Assistant

## 2023-02-13 VITALS — BP 110/60 | HR 62 | Ht 66.0 in | Wt 164.2 lb

## 2023-02-13 DIAGNOSIS — R072 Precordial pain: Secondary | ICD-10-CM

## 2023-02-13 DIAGNOSIS — I10 Essential (primary) hypertension: Secondary | ICD-10-CM | POA: Diagnosis not present

## 2023-02-13 DIAGNOSIS — R079 Chest pain, unspecified: Secondary | ICD-10-CM | POA: Diagnosis not present

## 2023-02-13 DIAGNOSIS — I480 Paroxysmal atrial fibrillation: Secondary | ICD-10-CM | POA: Diagnosis not present

## 2023-02-13 MED ORDER — METOPROLOL TARTRATE 25 MG PO TABS: 25.0000 mg | ORAL_TABLET | Freq: Every day | ORAL | 1 refills | Status: AC | PRN

## 2023-02-13 NOTE — Assessment & Plan Note (Signed)
She notes a recent episode of atrial fibrillation with rapid rate.  During this, she had chest discomfort.  She notes that this was unusual for her.  She has also noted fatigue which is unusual as well as shortness of breath with activity.  She is 89 years old and remains on flecainide.  As class Ic agents are contraindicated in patients with obstructive coronary artery disease, I have recommended proceeding with coronary CTA to rule out significant CAD.  I have also recommended proceeding with an echocardiogram to rule out structural heart disease. Arrange CCTA with FFR Arrange echocardiogram Follow-up in September as planned with Dr. Johney Frame or sooner if above testing abnormal

## 2023-02-13 NOTE — Patient Instructions (Signed)
Medication Instructions:  Your physician has recommended you make the following change in your medication:   START Metoprolol 25 mg taking 1 daily only as needed for afib  *If you need a refill on your cardiac medications before your next appointment, please call your pharmacy*   Lab Work: TODAY:  BMET, CBC, & TSH   If you have labs (blood work) drawn today and your tests are completely normal, you will receive your results only by: [place] (if you have MyChart) OR A paper copy in the mail If you have any lab test that is abnormal or we need to change your treatment, we will call you to review the results.   Testing/Procedures: Your physician has requested that you have an echocardiogram. Echocardiography is a painless test that uses sound waves to create images of your heart. It provides your doctor with information about the size and shape of your heart and how well your heart's chambers and valves are working. This procedure takes approximately one hour. There are no restrictions for this procedure. Please do NOT wear cologne, perfume, aftershave, or lotions (deodorant is allowed). Please arrive 15 minutes prior to your appointment time.   Your physician has requested that you have cardiac CT. Cardiac computed tomography (CT) is a painless test that uses an x-ray machine to take clear, detailed pictures of your heart. For further information please visit [email or web address]. Please follow instruction sheet as BELOW:    Your cardiac CT will be scheduled at one of the below locations:   [facility] [address] [place],  [address] [phone]  Please arrive at the [facility] and Children's Entrance (Entrance C2) of [facility] 30 minutes prior to test start time. You can use the FREE valet parking offered at entrance C (encouraged to control the heart rate for the test)  Proceed to the [facility] Radiology Department (first floor) to check-in  and test prep.  All radiology patients and guests should use entrance C2 at [facility], accessed from [facility], even though the hospital's physical address listed is [address].     Please follow these instructions carefully (unless otherwise directed):   On the Night Before the Test: Be sure to Drink plenty of water. Do not consume any caffeinated/decaffeinated beverages or chocolate 12 hours prior to your test. Do not take any antihistamines 12 hours prior to your test.   On the Day of the Test: Drink plenty of water until 1 hour prior to the test. Do not eat any food 1 hour prior to test. You may take your regular medications prior to the test.  Take metoprolol 25 MG  two hours prior to test. If you take Furosemide/Hydrochlorothiazide/Spironolactone, please HOLD on the morning of the test. FEMALES- please wear underwire-free bra if available, avoid dresses & tight clothing      After the Test: Drink plenty of water. After receiving IV contrast, you may experience a mild flushed feeling. This is normal. On occasion, you may experience a mild rash up to 24 hours after the test. This is not dangerous. If this occurs, you can take Benadryl 25 mg and increase your fluid intake. If you experience trouble breathing, this can be serious. If it is severe call 911 IMMEDIATELY. If it is mild, please call our office.  We will call to schedule your test 2-4 weeks out understanding that some insurance companies will need an authorization prior to the service being performed.  For non-scheduling related questions, please contact the cardiac imaging nurse navigator should you have any questions/concerns: Marchia Bond, Cardiac Imaging Nurse Navigator Gordy Clement, Cardiac Imaging Nurse Navigator [facility] Direct Office Dial: [phone]   For scheduling needs, including cancellations and rescheduling, please call [place],  [phone].    Follow-Up: At [facility], you and your health needs are our priority.  As part of our continuing mission to provide you with exceptional heart care, we have created designated Provider Care Teams.  These Care Teams include your primary Cardiologist (physician) and Advanced Practice Providers (APPs -  Physician Assistants and Nurse Practitioners) who all work together to provide you with the care you need, when you need it.  We recommend signing up for the patient portal called "MyChart".  Sign up information is provided on this After Visit Summary.  MyChart is used to connect with patients for Virtual Visits (Telemedicine).  Patients are able to view lab/test results, encounter notes, upcoming appointments, etc.  Non-urgent messages can be sent to your provider as well.   To learn more about what you can do with MyChart, go to [email or web address].    Your next appointment:   As scheduled   Provider:   Gwyndolyn Kaufman, MD     Other Instructions

## 2023-02-13 NOTE — Progress Notes (Signed)
Cardiology Office Note:    Date:  02/13/2023  ID:  Katherine Powell, DOB November 06, 1932, MRN [id number] [facility] Cardiologist:  Freada Bergeron, MD      Patient Profile:   Paroxysmal atrial fibrillation Flecainide Rx TTE 05/20/2018: Mild focal basal septal hypertrophy, EF 60-65, no RWMA, GR 2 DD, mild AI, mild LAE Myoview 07/08/2020: EF 74, no ischemia or infarction, normal Hypertension Breast CA ?Hx of TIA    History of Present Illness:   Katherine Powell is a 89 y.o. female who returns for evaluation of recurrent atrial fibrillation.  She was last seen by Dr. Tamala Julian 08/01/2022.  She called in last week with recurrent episodes of atrial fibrillation.  She is here alone.  She had atrial fibrillation over 2 days.  She took an extra dose of flecainide 100 mg.  She converted to sinus rhythm.  She notes that her heart rate was significantly elevated during this episode.  She did have chest pain with it.  She has also noted fatigue and worsening shortness of breath with exertion.  She still exercises on a regular basis.  She has to sleep on an incline and has done so recently.  She has not had syncope or leg edema.  She has not had significant weight changes.  Review of Systems  Gastrointestinal:  Negative for hematochezia and melena.  Genitourinary:  Negative for hematuria.   see the HPI    Studies Reviewed:    EKG: NSR, HR 62, normal axis, incomplete right bundle branch block, QRS 102 (unchanged from 01/23/2021), QTc 424, no change from prior tracings  Risk Assessment/Calculations:    CHA2DS2-VASc Score = 6   This indicates a 9.7% annual risk of stroke. The patient's score is based upon: CHF History: 0 HTN History: 1 Diabetes History: 0 Stroke History: 2 Vascular Disease History: 0 Age Score: 2 Gender Score: 1            Physical Exam:   VS:  BP 110/60   Pulse 62   Ht '5\' 6"'$  (1.676 m)   Wt 164 lb 3.2 oz (74.5 kg)   SpO2 94%   BMI 26.50 kg/m    Wt  Readings from Last 3 Encounters:  02/13/23 164 lb 3.2 oz (74.5 kg)  12/27/22 162 lb 9.6 oz (73.8 kg)  10/11/22 159 lb (72.1 kg)    Constitutional:      Appearance: Healthy appearance. Not in distress.  Neck:     Vascular: JVD normal.  Pulmonary:     Breath sounds: Normal breath sounds. No wheezing. No rales.  Cardiovascular:     Normal rate. Regular rhythm. Normal S1. Normal S2.      Murmurs: There is no murmur.  Edema:    Peripheral edema present.    Ankle: bilateral trace edema of the ankle. Abdominal:     Palpations: Abdomen is soft.       ASSESSMENT AND PLAN:   Precordial chest pain She notes a recent episode of atrial fibrillation with rapid rate.  During this, she had chest discomfort.  She notes that this was unusual for her.  She has also noted fatigue which is unusual as well as shortness of breath with activity.  She is 89 years old and remains on flecainide.  As class Ic agents are contraindicated in patients with obstructive coronary artery disease, I have recommended proceeding with coronary CTA to rule out significant CAD.  I have also recommended proceeding with an echocardiogram to rule  out structural heart disease. Arrange CCTA with FFR Arrange echocardiogram Follow-up in September as planned with Dr. Johney Frame or sooner if above testing abnormal  Paroxysmal atrial fibrillation As noted, she had a recurrent episode of atrial fibrillation with rapid ventricular rate recently.  She did take an extra dose of flecainide with conversion to sinus rhythm.  I have advised her to not take an extra dose of flecainide in the future.  Continue flecainide 100 mg twice daily, Cardizem CD 180 mg daily.  Her weight is above 60 kg and creatinine has remained less than 1.5.  Continue Eliquis 5 mg twice daily.  Obtain follow-up CBC, TSH today.  Prescription given for metoprolol tartrate 25 mg daily as needed for recurrent atrial fibrillation.  Consider EP referral if she continues to have  recurrent episodes of atrial fibrillation or if we need to take her off of flecainide.  Essential hypertension The patient's blood pressure is controlled on her current regimen.  Continue current therapy.      Dispo:  Return in about 6 months (around 08/16/2023) for Routine Follow Up, w/ Dr. Johney Frame.  Signed, Richardson Dopp, PA-C

## 2023-02-13 NOTE — Assessment & Plan Note (Signed)
The patient's blood pressure is controlled on her current regimen.  Continue current therapy.   

## 2023-02-13 NOTE — Assessment & Plan Note (Signed)
As noted, she had a recurrent episode of atrial fibrillation with rapid ventricular rate recently.  She did take an extra dose of flecainide with conversion to sinus rhythm.  I have advised her to not take an extra dose of flecainide in the future.  Continue flecainide 100 mg twice daily, Cardizem CD 180 mg daily.  Her weight is above 60 kg and creatinine has remained less than 1.5.  Continue Eliquis 5 mg twice daily.  Obtain follow-up CBC, TSH today.  Prescription given for metoprolol tartrate 25 mg daily as needed for recurrent atrial fibrillation.  Consider EP referral if she continues to have recurrent episodes of atrial fibrillation or if we need to take her off of flecainide.

## 2023-02-14 DIAGNOSIS — M4310 Spondylolisthesis, site unspecified: Secondary | ICD-10-CM | POA: Diagnosis not present

## 2023-02-14 DIAGNOSIS — M47816 Spondylosis without myelopathy or radiculopathy, lumbar region: Secondary | ICD-10-CM | POA: Diagnosis not present

## 2023-02-14 DIAGNOSIS — M9983 Other biomechanical lesions of lumbar region: Secondary | ICD-10-CM | POA: Diagnosis not present

## 2023-02-14 LAB — CBC
Hematocrit: 35.5 % (ref 34.0–46.6)
Hemoglobin: 11.6 g/dL (ref 11.1–15.9)
MCH: 32.1 pg (ref 26.6–33.0)
MCHC: 32.7 g/dL (ref 31.5–35.7)
MCV: 98 fL — ABNORMAL HIGH (ref 79–97)
Platelets: 227 10*3/uL (ref 150–450)
RBC: 3.61 x10E6/uL — ABNORMAL LOW (ref 3.77–5.28)
RDW: 13.1 % (ref 11.7–15.4)
WBC: 5.7 10*3/uL (ref 3.4–10.8)

## 2023-02-14 LAB — BASIC METABOLIC PANEL
BUN/Creatinine Ratio: 26 (ref 12–28)
BUN: 27 mg/dL (ref 8–27)
CO2: 23 mmol/L (ref 20–29)
Calcium: 9.4 mg/dL (ref 8.7–10.3)
Chloride: 102 mmol/L (ref 96–106)
Creatinine, Ser: 1.05 mg/dL — ABNORMAL HIGH (ref 0.57–1.00)
Glucose: 90 mg/dL (ref 70–99)
Potassium: 4.7 mmol/L (ref 3.5–5.2)
Sodium: 139 mmol/L (ref 134–144)
eGFR: 51 mL/min/{1.73_m2} — ABNORMAL LOW (ref >59)

## 2023-02-14 LAB — TSH: TSH: 0.906 u[IU]/mL (ref 0.450–4.500)

## 2023-02-18 ENCOUNTER — Other Ambulatory Visit: Payer: Self-pay

## 2023-02-18 MED ORDER — DILTIAZEM HCL ER COATED BEADS 180 MG PO CP24: 180.0000 mg | ORAL_CAPSULE | Freq: Every day | ORAL | 3 refills | Status: DC

## 2023-02-18 NOTE — Progress Notes (Signed)
Pt has been made aware of normal result and verbalized understanding.  jw

## 2023-02-25 DIAGNOSIS — H52213 Irregular astigmatism, bilateral: Secondary | ICD-10-CM | POA: Diagnosis not present

## 2023-02-25 DIAGNOSIS — H179 Unspecified corneal scar and opacity: Secondary | ICD-10-CM | POA: Diagnosis not present

## 2023-02-25 DIAGNOSIS — Z947 Corneal transplant status: Secondary | ICD-10-CM | POA: Diagnosis not present

## 2023-02-28 ENCOUNTER — Encounter (HOSPITAL_COMMUNITY): Payer: Self-pay

## 2023-02-28 ENCOUNTER — Telehealth (HOSPITAL_COMMUNITY): Payer: Self-pay | Admitting: *Deleted

## 2023-02-28 NOTE — Telephone Encounter (Signed)
Reaching out to patient to schedule her cardiac CT. Instructions also reviewed with patient. She will take her daily medications and denies an allergy to CT dye.  She is aware to arrive at 2:00 pm. She has a history of PAF and is aware that if she is in Afib, we may not be to complete her scan.  Will be sending Mychart instructions.  Gordy Clement RN Navigator Cardiac Imaging [facility] [phone] Office [phone] Cell

## 2023-03-04 ENCOUNTER — Ambulatory Visit (HOSPITAL_COMMUNITY)
Admission: RE | Admit: 2023-03-04 | Discharge: 2023-03-04 | Disposition: A | Discharge status: Home / Self Care | Payer: Medicare Other | Source: Ambulatory Visit | Attending: Physician Assistant | Admitting: Physician Assistant

## 2023-03-04 ENCOUNTER — Encounter: Payer: Self-pay | Admitting: Physician Assistant

## 2023-03-04 DIAGNOSIS — R072 Precordial pain: Secondary | ICD-10-CM | POA: Diagnosis not present

## 2023-03-04 DIAGNOSIS — I480 Paroxysmal atrial fibrillation: Secondary | ICD-10-CM | POA: Diagnosis not present

## 2023-03-04 DIAGNOSIS — I2510 Atherosclerotic heart disease of native coronary artery without angina pectoris: Secondary | ICD-10-CM

## 2023-03-04 HISTORY — DX: Atherosclerotic heart disease of native coronary artery without angina pectoris: I25.10

## 2023-03-04 MED ORDER — IOHEXOL 350 MG/ML SOLN
100.0000 mL | Freq: Once | INTRAVENOUS | Status: AC | PRN
  Administered 2023-03-04: 100 mL via INTRAVENOUS

## 2023-03-04 MED ORDER — NITROGLYCERIN 0.4 MG SL SUBL
0.8000 mg | SUBLINGUAL_TABLET | Freq: Once | SUBLINGUAL | Status: AC
  Administered 2023-03-04: 0.8 mg via SUBLINGUAL

## 2023-03-04 MED ORDER — NITROGLYCERIN 0.4 MG SL SUBL
SUBLINGUAL_TABLET | SUBLINGUAL | Status: AC
  Filled 2023-03-04: qty 2

## 2023-03-11 NOTE — Therapy (Unsigned)
OUTPATIENT PHYSICAL THERAPY THORACOLUMBAR EVALUATION   Patient Name: Katherine Powell MRN: [id number] DOB:1932-01-26, 89 y.o., female Today's Date: 03/12/2023  END OF SESSION:  PT End of Session - 03/12/23 1234     Visit Number 1    Date for PT Re-Evaluation 05/21/23    PT Start Time 0845    PT Stop Time 0913    PT Time Calculation (min) 28 min    Activity Tolerance Patient tolerated treatment well    Behavior During Therapy [facility] for tasks assessed/performed             Past Medical History:  Diagnosis Date   Acne rosacea 04/14/2021   Allergy    Anxiety    Arrhythmia    atrial fibrillation/  on    Arthritis    Atrial fibrillation    Breast cancer 11/09/2013   right upper outer   Contact lens/glasses fitting    contact rt eye   Coronary artery calcification 03/04/2023   CCTA 03/04/23: CAC score 27.2; total plaque volume is mild (8th percentile); min plaque LAD (<25)   Hx of radiation therapy 12/15/13- 01/11/14   right breast 4250 cGy in 17 sessions, (hypo-fractionated), no boost   Hypertension    Postmenopausal    Rosacea    Seasonal allergies    Sleep disturbance    Thyroid disease    Urinary incontinence    Past Surgical History:  Procedure Laterality Date   ABDOMINAL HYSTERECTOMY  1973   endometriosis   APPENDECTOMY  1954   BACK SURGERY     lumb-2005   BILATERAL OOPHORECTOMY     benign tumor on ovary   BREAST LUMPECTOMY WITH NEEDLE LOCALIZATION Right 11/09/2013   Procedure: BREAST LUMPECTOMY WITH NEEDLE LOCALIZATION;  Surgeon: [name], MD;  Location: [facility];  Service: General;  Laterality: Right;   CATARACT EXTRACTION  2013   both   CYSTOCELE REPAIR  2008   EYE SURGERY     cataracts-plugs for eyes   RECTOCELE REPAIR  2008   TONSILLECTOMY     Patient Active Problem List   Diagnosis Date Noted   Coronary artery calcification 03/04/2023   Precordial chest pain 02/13/2023   Arthropathy of lumbar facet joint 02/13/2022    Osteoarthritis of both hands 08/16/2021   Long term (current) use of anticoagulants 04/14/2021   Personal history of colonic polyps 04/14/2021   Mixed incontinence 04/14/2021   History of cornea transplant 04/14/2021   Essential hypertension 04/14/2021   Degeneration of lumbar intervertebral disc 04/14/2021   Corneal transplant failure, right eye 04/14/2021   Chronic kidney disease, stage 3a 04/14/2021   Anxiety 04/14/2021   Allergic rhinitis 04/14/2021   Presbycusis of both ears 04/14/2021   Red blood cell antibody positive 12/05/2018   Multinodular goiter (nontoxic) 02/11/2017   Spinal stenosis of lumbar region 10/31/2016   Degenerative spondylolisthesis 10/11/2016   Osteopenia 07/18/2016   Lumbar radiculopathy 01/05/2015   Paroxysmal atrial fibrillation 12/29/2014   Postmenopausal estrogen deficiency 01/27/2014   Malignant neoplasm of upper-outer quadrant of right breast in female, estrogen receptor positive 11/17/2013    PCP: [facility] Mast  REFERRING PROVIDER: Floreen Comber, NP  REFERRING DIAG: M43.10 (ICD-10-CM) - Spondylolisthesis, site unspecified   Rationale for Evaluation and Treatment: Rehabilitation  THERAPY DIAG:  Abnormal posture  Difficulty in walking, not elsewhere classified  Muscle weakness (generalized)  Acute left-sided low back pain without sciatica  ONSET DATE: 02/14/23  SUBJECTIVE:  SUBJECTIVE STATEMENT: Patient reports LBP which has gradually gotten worse. Bending over causes it to catch. She will normally hang from the door frame to get relief, but yesterday it did not help and her pain remains.  PERTINENT HISTORY:  Per referring clinician on 02/14/23 Patient bent to retrieve item out of refrigerator, with immediate onset of LBP. Pain has improved  significantly, 3/10. She is concerned it may go out again. Back surgery to place titanium rod in lumbar spine.  PAIN:  Are you having pain? Yes: NPRS scale: 10/10 Pain location: L low back Pain description: usually local, but yesterday she felt it in her leg Aggravating factors: bending over Relieving factors: ice, hanging to stretch trunk.  PRECAUTIONS: None  WEIGHT BEARING RESTRICTIONS: No  FALLS:  Has patient fallen in last 6 months? Yes. Number of falls 1, caught herself with her hands  LIVING ENVIRONMENT: Lives with: lives with their family Lives in: House/apartment Stairs: Yes: External: 2 steps; none Has following equipment at home: None  OCCUPATION: N/A-Goes to gym 3-4 x/week, house work.  PLOF: Independent  PATIENT GOALS: Get rid of her LBP  NEXT MD VISIT: End of April  OBJECTIVE:   DIAGNOSTIC FINDINGS:  Lumbar x rays 02/14/23-generalized degenerative changes, normal alignment, no instability, loss of disc height L4-5, L5-S1 Lumbar MRI 06/23/22- disc rupture at L4-5 with L5 nerve root impingement.  SCREENING FOR RED FLAGS: Bowel or bladder incontinence: No Spinal tumors: No Cauda equina syndrome: No Compression fracture: No Abdominal aneurysm: No  COGNITION: Overall cognitive status: Within functional limits for tasks assessed     SENSATION: WFL  MUSCLE LENGTH: Hamstrings:WFL B Thomas test: mildly tight  POSTURE: decreased lumbar lordosis, decreased thoracic kyphosis, right pelvic obliquity, and scoliosis  PALPATION: TTP L gluts along iliac crest, l SI joint, L piriformis  LUMBAR ROM:   AROM eval  Flexion 50%, P  Extension WFL  Right lateral flexion WFL  Left lateral flexion 80%  Right rotation WFL  Left rotation WFL   (Blank rows = not tested)  LOWER EXTREMITY ROM:   B hip ER and IR limited  LOWER EXTREMITY MMT:  R LE 5/5, L hip 3+/5 limited by pain, otherwise 4-5/5   LUMBAR SPECIAL TESTS:  Straight leg raise test: Negative and Slump  test: Negative  FUNCTIONAL TESTS:  5 times sit to stand: 12.8 sec, but painful throughout  GAIT: Distance walked: 100' Assistive device utilized: None Level of assistance: Complete Independence Comments: Antalgic gait, at times, walks in flexed posture due to back pain.  TODAY'S TREATMENT:                                                                                                                              DATE:  03/12/23  Education  PATIENT EDUCATION:  Education details: POC Person educated: Patient Education method: Explanation Education comprehension: verbalized understanding  HOME EXERCISE PROGRAM: Verbally instructed to continue with her hip stretching gently as tolerated since  she reports this gives relief.  ASSESSMENT:  CLINICAL IMPRESSION: Patient is a 89 y.o.  who was seen today for physical therapy evaluation and treatment for LBP. She reports onset about a month ago which initially improved, but continues to return. She reports pain in her L prox gluts, piriformis, and later in evaluation noted pain in L SI joint. She has weakness in her L hip due to the pain which impedes her functional mobility and balance as well. She will benefit from further assessment of SI function to determine whether this is contributing to her pain. She will also benefit from PT for STM, stretch, and strengthening to her trunk and lower body to improve her pain and stability.   OBJECTIVE IMPAIRMENTS: decreased activity tolerance, decreased balance, decreased coordination, decreased endurance, decreased mobility, difficulty walking, decreased ROM, decreased strength, increased muscle spasms, impaired flexibility, improper body mechanics, postural dysfunction, and pain.   ACTIVITY LIMITATIONS: carrying, lifting, bending, sleeping, stairs, and locomotion level  PARTICIPATION LIMITATIONS: meal prep, cleaning, laundry, and community activity  PERSONAL FACTORS: Age and Past/current experiences  are also affecting patient's functional outcome.   REHAB POTENTIAL: Good  CLINICAL DECISION MAKING: Stable/uncomplicated  EVALUATION COMPLEXITY: Low   GOALS: Goals reviewed with patient? Yes  SHORT TERM GOALS: Target date: 01/29/23  I with initial HEP Baseline: Goal status: INITIAL  LONG TERM GOALS: Target date: 05/21/23  I with final HEP Baseline:  Goal status: INITIAL  2.  5 x STS in < 12 sec with reports of no back pain, regular height surface Baseline: 12.8-pain throughout, elevated surface Goal status: INITIAL  3.  Patient will score at least > 41/56 on BERG balance test to demonstrate low fall risk Baseline: TBD Goal status: INITIAL  4.  Patient will walk at least 400' on all surfaces, I, no C/O back pain Baseline:  Goal status: INITIAL  5.  Patient will be able to perform her normal daily activities x 1 week with back pain < 3/10 Baseline:  Goal status: INITIAL  PLAN:  PT FREQUENCY: 1-2x/week  PT DURATION: 10 weeks  PLANNED INTERVENTIONS: Therapeutic exercises, Therapeutic activity, Neuromuscular re-education, Balance training, Gait training, Patient/Family education, Self Care, Joint mobilization, Dry Needling, Electrical stimulation, Cryotherapy, Moist heat, Ionotophoresis 4mg /ml Dexamethasone, and Manual therapy.  PLAN FOR NEXT SESSION: Further SI assessment, BERG, HEP   Iona BeardSusan M Amontae Ng, DPT 03/12/2023, 12:38 PM

## 2023-03-12 ENCOUNTER — Encounter: Payer: Self-pay | Admitting: Physical Therapy

## 2023-03-12 ENCOUNTER — Ambulatory Visit: Payer: Medicare Other | Attending: Student | Admitting: Physical Therapy

## 2023-03-12 DIAGNOSIS — R293 Abnormal posture: Secondary | ICD-10-CM | POA: Insufficient documentation

## 2023-03-12 DIAGNOSIS — M545 Low back pain, unspecified: Secondary | ICD-10-CM | POA: Insufficient documentation

## 2023-03-12 DIAGNOSIS — R262 Difficulty in walking, not elsewhere classified: Secondary | ICD-10-CM | POA: Diagnosis not present

## 2023-03-12 DIAGNOSIS — M6281 Muscle weakness (generalized): Secondary | ICD-10-CM | POA: Diagnosis not present

## 2023-03-14 ENCOUNTER — Ambulatory Visit: Payer: Medicare Other | Admitting: Physical Therapy

## 2023-03-15 ENCOUNTER — Encounter: Payer: Self-pay | Admitting: Physician Assistant

## 2023-03-15 ENCOUNTER — Ambulatory Visit (HOSPITAL_COMMUNITY): Payer: Medicare Other | Attending: Physician Assistant

## 2023-03-15 DIAGNOSIS — R072 Precordial pain: Secondary | ICD-10-CM | POA: Insufficient documentation

## 2023-03-15 DIAGNOSIS — I340 Nonrheumatic mitral (valve) insufficiency: Secondary | ICD-10-CM | POA: Insufficient documentation

## 2023-03-15 DIAGNOSIS — I480 Paroxysmal atrial fibrillation: Secondary | ICD-10-CM

## 2023-03-15 HISTORY — DX: Nonrheumatic mitral (valve) insufficiency: I34.0

## 2023-03-15 LAB — ECHOCARDIOGRAM COMPLETE
AV Vena cont: 0.4 cm
Area-P 1/2: 3.03 cm2
Calc EF: 57.2 %
P 1/2 time: 662 msec
S' Lateral: 3.6 cm
Single Plane A2C EF: 59.7 %
Single Plane A4C EF: 56.3 %

## 2023-03-18 ENCOUNTER — Telehealth: Payer: Self-pay | Admitting: Cardiology

## 2023-03-18 ENCOUNTER — Ambulatory Visit: Payer: Medicare Other | Admitting: Physical Therapy

## 2023-03-18 ENCOUNTER — Encounter: Payer: Self-pay | Admitting: Physical Therapy

## 2023-03-18 DIAGNOSIS — M6281 Muscle weakness (generalized): Secondary | ICD-10-CM | POA: Diagnosis not present

## 2023-03-18 DIAGNOSIS — M545 Low back pain, unspecified: Secondary | ICD-10-CM | POA: Diagnosis not present

## 2023-03-18 DIAGNOSIS — R293 Abnormal posture: Secondary | ICD-10-CM

## 2023-03-18 DIAGNOSIS — R262 Difficulty in walking, not elsewhere classified: Secondary | ICD-10-CM

## 2023-03-18 NOTE — Telephone Encounter (Signed)
Patient is returning call to discuss echo results. °

## 2023-03-18 NOTE — Telephone Encounter (Signed)
Pt aware of her echocardiogram results / recommendations and verbalized understanding

## 2023-03-18 NOTE — Therapy (Signed)
OUTPATIENT PHYSICAL THERAPY THORACOLUMBAR TREATMENT   Patient Name: Katherine Powell MRN: [id number] DOB:Apr 20, 1932, 89 y.o., female Today's Date: 03/18/2023  END OF SESSION:  PT End of Session - 03/18/23 1150     Visit Number 2    Date for PT Re-Evaluation 05/21/23    PT Start Time 1150    PT Stop Time 1230    PT Time Calculation (min) 40 min    Activity Tolerance Patient tolerated treatment well    Behavior During Therapy [facility] for tasks assessed/performed             Past Medical History:  Diagnosis Date   Acne rosacea 04/14/2021   Allergy    Anxiety    Arrhythmia    atrial fibrillation/  on    Arthritis    Atrial fibrillation    Breast cancer 11/09/2013   right upper outer   Contact lens/glasses fitting    contact rt eye   Coronary artery calcification 03/04/2023   CCTA 03/04/23: CAC score 27.2; total plaque volume is mild (8th percentile); min plaque LAD (<25)   Hx of radiation therapy 12/15/13- 01/11/14   right breast 4250 cGy in 17 sessions, (hypo-fractionated), no boost   Hypertension    Mitral regurgitation 03/15/2023   TTE 03/15/2023: EF 55-60, no RWMA, GLS -20.8, normal RVSF, normal PASP, RVSP 35.5, mild MR, mild AI, AV sclerosis, aortic root 4 cm, ascending aorta 3.7 cm (normal range), RAP 8   Postmenopausal    Rosacea    Seasonal allergies    Sleep disturbance    Thyroid disease    Urinary incontinence    Past Surgical History:  Procedure Laterality Date   ABDOMINAL HYSTERECTOMY  1973   endometriosis   APPENDECTOMY  1954   BACK SURGERY     lumb-2005   BILATERAL OOPHORECTOMY     benign tumor on ovary   BREAST LUMPECTOMY WITH NEEDLE LOCALIZATION Right 11/09/2013   Procedure: BREAST LUMPECTOMY WITH NEEDLE LOCALIZATION;  Surgeon: [name], MD;  Location: [facility];  Service: General;  Laterality: Right;   CATARACT EXTRACTION  2013   both   CYSTOCELE REPAIR  2008   EYE SURGERY     cataracts-plugs for eyes   RECTOCELE REPAIR   2008   TONSILLECTOMY     Patient Active Problem List   Diagnosis Date Noted   Mitral regurgitation 03/15/2023   Coronary artery calcification 03/04/2023   Precordial chest pain 02/13/2023   Arthropathy of lumbar facet joint 02/13/2022   Osteoarthritis of both hands 08/16/2021   Long term (current) use of anticoagulants 04/14/2021   Personal history of colonic polyps 04/14/2021   Mixed incontinence 04/14/2021   History of cornea transplant 04/14/2021   Essential hypertension 04/14/2021   Degeneration of lumbar intervertebral disc 04/14/2021   Corneal transplant failure, right eye 04/14/2021   Chronic kidney disease, stage 3a 04/14/2021   Anxiety 04/14/2021   Allergic rhinitis 04/14/2021   Presbycusis of both ears 04/14/2021   Red blood cell antibody positive 12/05/2018   Multinodular goiter (nontoxic) 02/11/2017   Spinal stenosis of lumbar region 10/31/2016   Degenerative spondylolisthesis 10/11/2016   Osteopenia 07/18/2016   Lumbar radiculopathy 01/05/2015   Paroxysmal atrial fibrillation 12/29/2014   Postmenopausal estrogen deficiency 01/27/2014   Malignant neoplasm of upper-outer quadrant of right breast in female, estrogen receptor positive 11/17/2013    PCP: [facility] Mast  REFERRING PROVIDER: Floreen Comber, NP  REFERRING DIAG: M43.10 (ICD-10-CM) - Spondylolisthesis, site unspecified  Rationale for Evaluation and Treatment: Rehabilitation  THERAPY DIAG:  Abnormal posture  Muscle weakness (generalized)  Difficulty in walking, not elsewhere classified  Acute left-sided low back pain without sciatica  ONSET DATE: 02/14/23  SUBJECTIVE:                                                                                                                                                                                           SUBJECTIVE STATEMENT: "I think I found the reason" Pt stated she walks upright she does not have to pain.   PERTINENT HISTORY:  Per  referring clinician on 02/14/23 Patient bent to retrieve item out of refrigerator, with immediate onset of LBP. Pain has improved significantly, 3/10. She is concerned it may go out again. Back surgery to place titanium rod in lumbar spine.  PAIN:  Are you having pain? Yes: NPRS scale: 0/10 Pain location: L low back Pain description: usually local, but yesterday she felt it in her leg Aggravating factors: bending over Relieving factors: ice, hanging to stretch trunk.  PRECAUTIONS: None  WEIGHT BEARING RESTRICTIONS: No  FALLS:  Has patient fallen in last 6 months? Yes. Number of falls 1, caught herself with her hands  LIVING ENVIRONMENT: Lives with: lives with their family Lives in: House/apartment Stairs: Yes: External: 2 steps; none Has following equipment at home: None  OCCUPATION: N/A-Goes to gym 3-4 x/week, house work.  PLOF: Independent  PATIENT GOALS: Get rid of her LBP  NEXT MD VISIT: End of April  OBJECTIVE:   DIAGNOSTIC FINDINGS:  Lumbar x rays 02/14/23-generalized degenerative changes, normal alignment, no instability, loss of disc height L4-5, L5-S1 Lumbar MRI 06/23/22- disc rupture at L4-5 with L5 nerve root impingement.  SCREENING FOR RED FLAGS: Bowel or bladder incontinence: No Spinal tumors: No Cauda equina syndrome: No Compression fracture: No Abdominal aneurysm: No  COGNITION: Overall cognitive status: Within functional limits for tasks assessed     SENSATION: WFL  MUSCLE LENGTH: Hamstrings:WFL B Thomas test: mildly tight  POSTURE: decreased lumbar lordosis, decreased thoracic kyphosis, right pelvic obliquity, and scoliosis  PALPATION: TTP L gluts along iliac crest, l SI joint, L piriformis  LUMBAR ROM:   AROM eval  Flexion 50%, P  Extension WFL  Right lateral flexion WFL  Left lateral flexion 80%  Right rotation WFL  Left rotation WFL   (Blank rows = not tested)  LOWER EXTREMITY ROM:   B hip ER and IR limited  LOWER EXTREMITY  MMT:  R LE 5/5, L hip 3+/5 limited by pain, otherwise 4-5/5   LUMBAR SPECIAL TESTS:  Straight leg raise test: Negative and Slump test: Negative  FUNCTIONAL TESTS:  5 times sit to stand: 12.8 sec, but painful throughout  GAIT: Distance walked: 100' Assistive device utilized: None Level of assistance: Complete Independence Comments: Antalgic gait, at times, walks in flexed posture due to back pain.  TODAY'S TREATMENT:                                                                                                                              DATE:  03/18/23 NuStep L5 x 6 min S2S x10  Rows green 2x10 Shoulder Ext green 2x10 Hamstring curls 20lb 2x10 Leg Ext 5lb 2x10 Bridges LE on Pball bridges, Obq, K2C Passive HS, piriformis, K2C stretch.   03/12/23  Education  PATIENT EDUCATION:  Education details: POC Person educated: Patient Education method: Explanation Education comprehension: verbalized understanding  HOME EXERCISE PROGRAM: Verbally instructed to continue with her hip stretching gently as tolerated since she reports this gives relief.  ASSESSMENT:  CLINICAL IMPRESSION: Patient is a 89 y.o.  who was seen today for physical therapy treatment for LBP. She reports improvement when she ambulates maintaining an upright posture. Cues for pacing needed with sit to stand. Postural cues required with standing rows and extensions. Some tightness present with HS stretch. She will also benefit from PT for STM, stretch, and strengthening to her trunk and lower body to improve her pain and stability.   OBJECTIVE IMPAIRMENTS: decreased activity tolerance, decreased balance, decreased coordination, decreased endurance, decreased mobility, difficulty walking, decreased ROM, decreased strength, increased muscle spasms, impaired flexibility, improper body mechanics, postural dysfunction, and pain.   ACTIVITY LIMITATIONS: carrying, lifting, bending, sleeping, stairs, and locomotion  level  PARTICIPATION LIMITATIONS: meal prep, cleaning, laundry, and community activity  PERSONAL FACTORS: Age and Past/current experiences are also affecting patient's functional outcome.   REHAB POTENTIAL: Good  CLINICAL DECISION MAKING: Stable/uncomplicated  EVALUATION COMPLEXITY: Low   GOALS: Goals reviewed with patient? Yes  SHORT TERM GOALS: Target date: 01/29/23  I with initial HEP Baseline: Goal status: INITIAL  LONG TERM GOALS: Target date: 05/21/23  I with final HEP Baseline:  Goal status: INITIAL  2.  5 x STS in < 12 sec with reports of no back pain, regular height surface Baseline: 12.8-pain throughout, elevated surface Goal status: INITIAL  3.  Patient will score at least > 41/56 on BERG balance test to demonstrate low fall risk Baseline: TBD Goal status: INITIAL  4.  Patient will walk at least 400' on all surfaces, I, no C/O back pain Baseline:  Goal status: INITIAL  5.  Patient will be able to perform her normal daily activities x 1 week with back pain < 3/10 Baseline:  Goal status: INITIAL  PLAN:  PT FREQUENCY: 1-2x/week  PT DURATION: 10 weeks  PLANNED INTERVENTIONS: Therapeutic exercises, Therapeutic activity, Neuromuscular re-education, Balance training, Gait training, Patient/Family education, Self Care, Joint mobilization, Dry Needling, Electrical stimulation, Cryotherapy, Moist heat, Ionotophoresis 4mg /ml Dexamethasone, and Manual therapy.  PLAN FOR NEXT SESSION: Further SI assessment, BERG,  HEP   Iona Beard, DPT 03/18/2023, 11:51 AM

## 2023-03-20 ENCOUNTER — Ambulatory Visit: Payer: Medicare Other | Admitting: Physical Therapy

## 2023-03-20 ENCOUNTER — Encounter: Payer: Self-pay | Admitting: Physical Therapy

## 2023-03-20 DIAGNOSIS — M6281 Muscle weakness (generalized): Secondary | ICD-10-CM

## 2023-03-20 DIAGNOSIS — R293 Abnormal posture: Secondary | ICD-10-CM | POA: Diagnosis not present

## 2023-03-20 DIAGNOSIS — R262 Difficulty in walking, not elsewhere classified: Secondary | ICD-10-CM | POA: Diagnosis not present

## 2023-03-20 DIAGNOSIS — M545 Low back pain, unspecified: Secondary | ICD-10-CM | POA: Diagnosis not present

## 2023-03-20 NOTE — Therapy (Signed)
OUTPATIENT PHYSICAL THERAPY THORACOLUMBAR TREATMENT   Patient Name: Katherine Powell MRN: [id number] DOB:December 20, 1931, 89 y.o., female Today's Date: 03/20/2023  END OF SESSION:  PT End of Session - 03/20/23 1551     Visit Number 3    Date for PT Re-Evaluation 05/21/23    PT Start Time 1547    PT Stop Time 1625    PT Time Calculation (min) 38 min    Activity Tolerance Patient tolerated treatment well    Behavior During Therapy [facility] for tasks assessed/performed             Past Medical History:  Diagnosis Date   Acne rosacea 04/14/2021   Allergy    Anxiety    Arrhythmia    atrial fibrillation/  on    Arthritis    Atrial fibrillation    Breast cancer 11/09/2013   right upper outer   Contact lens/glasses fitting    contact rt eye   Coronary artery calcification 03/04/2023   CCTA 03/04/23: CAC score 27.2; total plaque volume is mild (8th percentile); min plaque LAD (<25)   Hx of radiation therapy 12/15/13- 01/11/14   right breast 4250 cGy in 17 sessions, (hypo-fractionated), no boost   Hypertension    Mitral regurgitation 03/15/2023   TTE 03/15/2023: EF 55-60, no RWMA, GLS -20.8, normal RVSF, normal PASP, RVSP 35.5, mild MR, mild AI, AV sclerosis, aortic root 4 cm, ascending aorta 3.7 cm (normal range), RAP 8   Postmenopausal    Rosacea    Seasonal allergies    Sleep disturbance    Thyroid disease    Urinary incontinence    Past Surgical History:  Procedure Laterality Date   ABDOMINAL HYSTERECTOMY  1973   endometriosis   APPENDECTOMY  1954   BACK SURGERY     lumb-2005   BILATERAL OOPHORECTOMY     benign tumor on ovary   BREAST LUMPECTOMY WITH NEEDLE LOCALIZATION Right 11/09/2013   Procedure: BREAST LUMPECTOMY WITH NEEDLE LOCALIZATION;  Surgeon: [name], MD;  Location: [facility];  Service: General;  Laterality: Right;   CATARACT EXTRACTION  2013   both   CYSTOCELE REPAIR  2008   EYE SURGERY     cataracts-plugs for eyes   RECTOCELE REPAIR   2008   TONSILLECTOMY     Patient Active Problem List   Diagnosis Date Noted   Mitral regurgitation 03/15/2023   Coronary artery calcification 03/04/2023   Precordial chest pain 02/13/2023   Arthropathy of lumbar facet joint 02/13/2022   Osteoarthritis of both hands 08/16/2021   Long term (current) use of anticoagulants 04/14/2021   Personal history of colonic polyps 04/14/2021   Mixed incontinence 04/14/2021   History of cornea transplant 04/14/2021   Essential hypertension 04/14/2021   Degeneration of lumbar intervertebral disc 04/14/2021   Corneal transplant failure, right eye 04/14/2021   Chronic kidney disease, stage 3a 04/14/2021   Anxiety 04/14/2021   Allergic rhinitis 04/14/2021   Presbycusis of both ears 04/14/2021   Red blood cell antibody positive 12/05/2018   Multinodular goiter (nontoxic) 02/11/2017   Spinal stenosis of lumbar region 10/31/2016   Degenerative spondylolisthesis 10/11/2016   Osteopenia 07/18/2016   Lumbar radiculopathy 01/05/2015   Paroxysmal atrial fibrillation 12/29/2014   Postmenopausal estrogen deficiency 01/27/2014   Malignant neoplasm of upper-outer quadrant of right breast in female, estrogen receptor positive 11/17/2013    PCP: Loyola Mast  REFERRING PROVIDER: Floreen Comber, NP  REFERRING DIAG: M43.10 (ICD-10-CM) - Spondylolisthesis, site unspecified  Rationale for Evaluation and Treatment: Rehabilitation  THERAPY DIAG:  Abnormal posture  Muscle weakness (generalized)  Difficulty in walking, not elsewhere classified  Acute left-sided low back pain without sciatica  ONSET DATE: 02/14/23  SUBJECTIVE:                                                                                                                                                                                           SUBJECTIVE STATEMENT: Patient reports that her back pain remains controlled as long as she maintains upright posture.   PERTINENT HISTORY:   Per referring clinician on 02/14/23 Patient bent to retrieve item out of refrigerator, with immediate onset of LBP. Pain has improved significantly, 3/10. She is concerned it may go out again. Back surgery to place titanium rod in lumbar spine.  PAIN:  Are you having pain? Yes: NPRS scale: 0/10 Pain location: L low back Pain description: usually local, but yesterday she felt it in her leg Aggravating factors: bending over Relieving factors: ice, hanging to stretch trunk.  PRECAUTIONS: None  WEIGHT BEARING RESTRICTIONS: No  FALLS:  Has patient fallen in last 6 months? Yes. Number of falls 1, caught herself with her hands  LIVING ENVIRONMENT: Lives with: lives with their family Lives in: House/apartment Stairs: Yes: External: 2 steps; none Has following equipment at home: None  OCCUPATION: N/A-Goes to gym 3-4 x/week, house work.  PLOF: Independent  PATIENT GOALS: Get rid of her LBP  NEXT MD VISIT: End of April  OBJECTIVE:   DIAGNOSTIC FINDINGS:  Lumbar x rays 02/14/23-generalized degenerative changes, normal alignment, no instability, loss of disc height L4-5, L5-S1 Lumbar MRI 06/23/22- disc rupture at L4-5 with L5 nerve root impingement.  SCREENING FOR RED FLAGS: Bowel or bladder incontinence: No Spinal tumors: No Cauda equina syndrome: No Compression fracture: No Abdominal aneurysm: No  COGNITION: Overall cognitive status: Within functional limits for tasks assessed     SENSATION: WFL  MUSCLE LENGTH: Hamstrings:WFL B Thomas test: mildly tight  POSTURE: decreased lumbar lordosis, decreased thoracic kyphosis, right pelvic obliquity, and scoliosis  PALPATION: TTP L gluts along iliac crest, l SI joint, L piriformis  LUMBAR ROM:   AROM eval  Flexion 50%, P  Extension WFL  Right lateral flexion WFL  Left lateral flexion 80%  Right rotation WFL  Left rotation WFL   (Blank rows = not tested)  LOWER EXTREMITY ROM:   B hip ER and IR limited  LOWER  EXTREMITY MMT:  R LE 5/5, L hip 3+/5 limited by pain, otherwise 4-5/5   LUMBAR SPECIAL TESTS:  Straight leg raise test: Negative and Slump test: Negative  FUNCTIONAL TESTS:  5 times sit to stand: 12.8 sec, but painful throughout  GAIT: Distance walked: 100' Assistive device utilized: None Level of assistance: Complete Independence Comments: Antalgic gait, at times, walks in flexed posture due to back pain.  TODAY'S TREATMENT:                                                                                                                              DATE:  03/20/23 NuStep L5 x 6 minutes Reviewed exercise equipment- HS curls 25#, Knee ext-10#, Lat pulls, 25#, Rows 20#, Trunk ext against Black Tband, chest press 15#, 10 reps of each Seated push ups- uneven due to scoliosis Standing shoulder ext, rows, ER against G tband resistance x 10 each  03/18/23 NuStep L5 x 6 min S2S x10  Rows green 2x10 Shoulder Ext green 2x10 Hamstring curls 20lb 2x10 Leg Ext 5lb 2x10 Bridges LE on Pball bridges, Obq, K2C Passive HS, piriformis, K2C stretch.  03/12/23  Education  PATIENT EDUCATION:  Education details: POC Person educated: Patient Education method: Explanation Education comprehension: verbalized understanding  HOME EXERCISE PROGRAM: Verbally instructed to continue with her hip stretching gently as tolerated since she reports this gives relief.  ASSESSMENT:  CLINICAL IMPRESSION: Patient reports that she feels she is much better and ready to wrap up therapy. Reviewed gym equipment that she uses in the gym and she does appear to have a thorough routine. However, she [facility] not have an [facility]. Plan is to return for one more visit to complete the HEP.  OBJECTIVE IMPAIRMENTS: decreased activity tolerance, decreased balance, decreased coordination, decreased endurance, decreased mobility, difficulty walking, decreased ROM, decreased strength,  increased muscle spasms, impaired flexibility, improper body mechanics, postural dysfunction, and pain.   ACTIVITY LIMITATIONS: carrying, lifting, bending, sleeping, stairs, and locomotion level  PARTICIPATION LIMITATIONS: meal prep, cleaning, laundry, and community activity  PERSONAL FACTORS: Age and Past/current experiences are also affecting patient's functional outcome.   REHAB POTENTIAL: Good  CLINICAL DECISION MAKING: Stable/uncomplicated  EVALUATION COMPLEXITY: Low   GOALS: Goals reviewed with patient? Yes  SHORT TERM GOALS: Target date: 01/29/23  I with initial HEP Baseline: Goal status: 03/20/23-provided  LONG TERM GOALS: Target date: 05/21/23  I with final HEP Baseline:  Goal status: INITIAL  2.  5 x STS in < 12 sec with reports of no back pain, regular height surface Baseline: 12.8-pain throughout, elevated surface Goal status: INITIAL  3.  Patient will score at least > 41/56 on BERG balance test to demonstrate low fall risk Baseline: TBD Goal status: INITIAL  4.  Patient will walk at least 400' on all surfaces, I, no C/O back pain Baseline:  Goal status: INITIAL  5.  Patient will be able to perform her normal daily activities x 1 week with back pain < 3/10 Baseline:  Goal status: 03/20/23-met  PLAN:  PT FREQUENCY: 1-2x/week  PT DURATION: 10 weeks  PLANNED  INTERVENTIONS: Therapeutic exercises, Therapeutic activity, Neuromuscular re-education, Balance training, Gait training, Patient/Family education, Self Care, Joint mobilization, Dry Needling, Electrical stimulation, Cryotherapy, Moist heat, Ionotophoresis /ml Dexamethasone, and Manual therapy.  PLAN FOR NEXT SESSION: Complete HEP, re-assess for D/C. Needs hip abd, squats, and additional postural strengthening.   Iona Beard, DPT 03/20/2023, 4:31 PM

## 2023-03-26 ENCOUNTER — Ambulatory Visit: Payer: Medicare Other

## 2023-03-26 DIAGNOSIS — M545 Low back pain, unspecified: Secondary | ICD-10-CM

## 2023-03-26 DIAGNOSIS — M6281 Muscle weakness (generalized): Secondary | ICD-10-CM | POA: Diagnosis not present

## 2023-03-26 DIAGNOSIS — R262 Difficulty in walking, not elsewhere classified: Secondary | ICD-10-CM

## 2023-03-26 DIAGNOSIS — R293 Abnormal posture: Secondary | ICD-10-CM | POA: Diagnosis not present

## 2023-03-26 NOTE — Therapy (Signed)
OUTPATIENT PHYSICAL THERAPY THORACOLUMBAR TREATMENT   Patient Name: Katherine Powell MRN: [id number] DOB:Oct 07, 1932, 89 y.o., female Today's Date: 03/26/2023  END OF SESSION:  PT End of Session - 03/26/23 0929     Visit Number 4    Date for PT Re-Evaluation 05/21/23    PT Start Time 0930    PT Stop Time 1007    PT Time Calculation (min) 37 min    Activity Tolerance Patient tolerated treatment well    Behavior During Therapy [facility] for tasks assessed/performed             Past Medical History:  Diagnosis Date   Acne rosacea 04/14/2021   Allergy    Anxiety    Arrhythmia    atrial fibrillation/  on    Arthritis    Atrial fibrillation    Breast cancer 11/09/2013   right upper outer   Contact lens/glasses fitting    contact rt eye   Coronary artery calcification 03/04/2023   CCTA 03/04/23: CAC score 27.2; total plaque volume is mild (8th percentile); min plaque LAD (<25)   Hx of radiation therapy 12/15/13- 01/11/14   right breast 4250 cGy in 17 sessions, (hypo-fractionated), no boost   Hypertension    Mitral regurgitation 03/15/2023   TTE 03/15/2023: EF 55-60, no RWMA, GLS -20.8, normal RVSF, normal PASP, RVSP 35.5, mild MR, mild AI, AV sclerosis, aortic root 4 cm, ascending aorta 3.7 cm (normal range), RAP 8   Postmenopausal    Rosacea    Seasonal allergies    Sleep disturbance    Thyroid disease    Urinary incontinence    Past Surgical History:  Procedure Laterality Date   ABDOMINAL HYSTERECTOMY  1973   endometriosis   APPENDECTOMY  1954   BACK SURGERY     lumb-2005   BILATERAL OOPHORECTOMY     benign tumor on ovary   BREAST LUMPECTOMY WITH NEEDLE LOCALIZATION Right 11/09/2013   Procedure: BREAST LUMPECTOMY WITH NEEDLE LOCALIZATION;  Surgeon: [name], MD;  Location: [facility];  Service: General;  Laterality: Right;   CATARACT EXTRACTION  2013   both   CYSTOCELE REPAIR  2008   EYE SURGERY     cataracts-plugs for eyes   RECTOCELE REPAIR   2008   TONSILLECTOMY     Patient Active Problem List   Diagnosis Date Noted   Mitral regurgitation 03/15/2023   Coronary artery calcification 03/04/2023   Precordial chest pain 02/13/2023   Arthropathy of lumbar facet joint 02/13/2022   Osteoarthritis of both hands 08/16/2021   Long term (current) use of anticoagulants 04/14/2021   Personal history of colonic polyps 04/14/2021   Mixed incontinence 04/14/2021   History of cornea transplant 04/14/2021   Essential hypertension 04/14/2021   Degeneration of lumbar intervertebral disc 04/14/2021   Corneal transplant failure, right eye 04/14/2021   Chronic kidney disease, stage 3a 04/14/2021   Anxiety 04/14/2021   Allergic rhinitis 04/14/2021   Presbycusis of both ears 04/14/2021   Red blood cell antibody positive 12/05/2018   Multinodular goiter (nontoxic) 02/11/2017   Spinal stenosis of lumbar region 10/31/2016   Degenerative spondylolisthesis 10/11/2016   Osteopenia 07/18/2016   Lumbar radiculopathy 01/05/2015   Paroxysmal atrial fibrillation 12/29/2014   Postmenopausal estrogen deficiency 01/27/2014   Malignant neoplasm of upper-outer quadrant of right breast in female, estrogen receptor positive 11/17/2013    PCP: [facility] Mast  REFERRING PROVIDER: Floreen Comber, NP  REFERRING DIAG: M43.10 (ICD-10-CM) - Spondylolisthesis, site unspecified  Rationale for Evaluation and Treatment: Rehabilitation  THERAPY DIAG:  Abnormal posture  Muscle weakness (generalized)  Difficulty in walking, not elsewhere classified  Acute left-sided low back pain without sciatica  ONSET DATE: 02/14/23  SUBJECTIVE:                                                                                                                                                                                           SUBJECTIVE STATEMENT: I am doing good, I found the problem was that if I stand up straight I don't hurt.   PERTINENT HISTORY:  Per  referring clinician on 02/14/23 Patient bent to retrieve item out of refrigerator, with immediate onset of LBP. Pain has improved significantly, 3/10. She is concerned it may go out again. Back surgery to place titanium rod in lumbar spine.  PAIN:  Are you having pain? Yes: NPRS scale: 0/10 Pain location: L low back Pain description: usually local, but yesterday she felt it in her leg Aggravating factors: bending over Relieving factors: ice, hanging to stretch trunk.  PRECAUTIONS: None  WEIGHT BEARING RESTRICTIONS: No  FALLS:  Has patient fallen in last 6 months? Yes. Number of falls 1, caught herself with her hands  LIVING ENVIRONMENT: Lives with: lives with their family Lives in: House/apartment Stairs: Yes: External: 2 steps; none Has following equipment at home: None  OCCUPATION: N/A-Goes to gym 3-4 x/week, house work.  PLOF: Independent  PATIENT GOALS: Get rid of her LBP  NEXT MD VISIT: End of April  OBJECTIVE:   DIAGNOSTIC FINDINGS:  Lumbar x rays 02/14/23-generalized degenerative changes, normal alignment, no instability, loss of disc height L4-5, L5-S1 Lumbar MRI 06/23/22- disc rupture at L4-5 with L5 nerve root impingement.  SCREENING FOR RED FLAGS: Bowel or bladder incontinence: No Spinal tumors: No Cauda equina syndrome: No Compression fracture: No Abdominal aneurysm: No  COGNITION: Overall cognitive status: Within functional limits for tasks assessed     SENSATION: WFL  MUSCLE LENGTH: Hamstrings:WFL B Thomas test: mildly tight  POSTURE: decreased lumbar lordosis, decreased thoracic kyphosis, right pelvic obliquity, and scoliosis  PALPATION: TTP L gluts along iliac crest, l SI joint, L piriformis  LUMBAR ROM:   AROM eval  Flexion 50%, P  Extension WFL  Right lateral flexion WFL  Left lateral flexion 80%  Right rotation WFL  Left rotation WFL   (Blank rows = not tested)  LOWER EXTREMITY ROM:   B hip ER and IR limited  LOWER EXTREMITY  MMT:  R LE 5/5, L hip 3+/5 limited by pain, otherwise 4-5/5   LUMBAR SPECIAL TESTS:  Straight leg raise test: Negative and Slump test: Negative  FUNCTIONAL TESTS:  5 times sit to stand: 12.8 sec, but painful throughout  GAIT: Distance walked: 100' Assistive device utilized: None Level of assistance: Complete Independence Comments: Antalgic gait, at times, walks in flexed posture due to back pain.  TODAY'S TREATMENT:                                                                                                                              DATE:  03/26/23 Walk outdoors 1 big lap NuStep L5 x64mins  Leg ext 10# 2x10 HS curls 25# 2x10 Leg press 20# 2x10 Rows and Lats 20# 2x10  Review goals  HEP review   03/20/23 NuStep L5 x 6 minutes Reviewed exercise equipment- HS curls 25#, Knee ext-10#, Lat pulls, 25#, Rows 20#, Trunk ext against Black Tband, chest press 15#, 10 reps of each Seated push ups- uneven due to scoliosis Standing shoulder ext, rows, ER against G tband resistance x 10 each  03/18/23 NuStep L5 x 6 min S2S x10  Rows green 2x10 Shoulder Ext green 2x10 Hamstring curls 20lb 2x10 Leg Ext 5lb 2x10 Bridges LE on Pball bridges, Obq, K2C Passive HS, piriformis, K2C stretch.  03/12/23  Education  PATIENT EDUCATION:  Education details: POC Person educated: Patient Education method: Explanation Education comprehension: verbalized understanding  HOME EXERCISE PROGRAM: Verbally instructed to continue with her hip stretching gently as tolerated since she reports this gives relief.  ASSESSMENT:  CLINICAL IMPRESSION: Patient reports that she feels she is much better and ready to wrap up therapy. Reviewed gym equipment that she uses in the gym and she does appear to have a thorough routine. Is doing her HEP at home. Has met all goals and is ready for d/c.   OBJECTIVE IMPAIRMENTS: decreased activity tolerance, decreased balance, decreased coordination, decreased  endurance, decreased mobility, difficulty walking, decreased ROM, decreased strength, increased muscle spasms, impaired flexibility, improper body mechanics, postural dysfunction, and pain.   ACTIVITY LIMITATIONS: carrying, lifting, bending, sleeping, stairs, and locomotion level  PARTICIPATION LIMITATIONS: meal prep, cleaning, laundry, and community activity  PERSONAL FACTORS: Age and Past/current experiences are also affecting patient's functional outcome.   REHAB POTENTIAL: Good  CLINICAL DECISION MAKING: Stable/uncomplicated  EVALUATION COMPLEXITY: Low   GOALS: Goals reviewed with patient? Yes  SHORT TERM GOALS: Target date: 01/29/23  I with initial HEP Baseline: Goal status: 03/20/23-provided  LONG TERM GOALS: Target date: 05/21/23  I with final HEP Baseline:  Goal status: MET  2.  5 x STS in < 12 sec with reports of no back pain, regular height surface Baseline: 12.8-pain throughout, elevated surface, 10.52s Goal status: MET  4.  Patient will walk at least 400' on all surfaces, I, no C/O back pain Baseline:  Goal status: MET  5.  Patient will be able to perform her normal daily activities x 1 week with back pain < 3/10 Baseline:  Goal status: 03/20/23-met  PLAN:  PT FREQUENCY: 1-2x/week  PT DURATION: 10 weeks  PLANNED  INTERVENTIONS: Therapeutic exercises, Therapeutic activity, Neuromuscular re-education, Balance training, Gait training, Patient/Family education, Self Care, Joint mobilization, Dry Needling, Electrical stimulation, Cryotherapy, Moist heat, Ionotophoresis /ml Dexamethasone, and Manual therapy.  PLAN FOR NEXT SESSION: Complete HEP, re-assess for D/C. Needs hip abd, squats, and additional postural strengthening.  PHYSICAL THERAPY DISCHARGE SUMMARY  Visits from Start of Care: 4   Patient agrees to discharge. Patient goals were met. Patient is being discharged due to meeting the stated rehab goals.   Cassie Freer, DPT 03/26/2023, 10:07 AM

## 2023-03-27 ENCOUNTER — Encounter: Payer: Medicare Other | Admitting: Physical Therapy

## 2023-03-28 ENCOUNTER — Encounter: Payer: Medicare Other | Admitting: Physical Therapy

## 2023-03-28 DIAGNOSIS — M47816 Spondylosis without myelopathy or radiculopathy, lumbar region: Secondary | ICD-10-CM | POA: Diagnosis not present

## 2023-03-28 DIAGNOSIS — Z6825 Body mass index (BMI) 25.0-25.9, adult: Secondary | ICD-10-CM | POA: Diagnosis not present

## 2023-04-02 ENCOUNTER — Telehealth: Payer: Self-pay | Admitting: Family Medicine

## 2023-04-02 NOTE — Telephone Encounter (Signed)
Pt want want to know if you can call her something in for hot flashes

## 2023-04-02 NOTE — Telephone Encounter (Signed)
Appt scheduled for 5/15 @ 1:20 pm.  Katherine Powell agreeable. Dm/cma

## 2023-04-09 DIAGNOSIS — H52213 Irregular astigmatism, bilateral: Secondary | ICD-10-CM | POA: Diagnosis not present

## 2023-04-09 DIAGNOSIS — H18211 Corneal edema secondary to contact lens, right eye: Secondary | ICD-10-CM | POA: Diagnosis not present

## 2023-04-17 ENCOUNTER — Ambulatory Visit (INDEPENDENT_AMBULATORY_CARE_PROVIDER_SITE_OTHER): Payer: Medicare Other | Admitting: Family Medicine

## 2023-04-17 ENCOUNTER — Encounter: Payer: Self-pay | Admitting: Family Medicine

## 2023-04-17 VITALS — BP 118/66 | HR 69 | Temp 98.2°F | Ht 66.0 in | Wt 159.0 lb

## 2023-04-17 DIAGNOSIS — N1831 Chronic kidney disease, stage 3a: Secondary | ICD-10-CM | POA: Diagnosis not present

## 2023-04-17 DIAGNOSIS — I10 Essential (primary) hypertension: Secondary | ICD-10-CM | POA: Diagnosis not present

## 2023-04-17 DIAGNOSIS — F419 Anxiety disorder, unspecified: Secondary | ICD-10-CM

## 2023-04-17 DIAGNOSIS — N951 Menopausal and female climacteric states: Secondary | ICD-10-CM | POA: Diagnosis not present

## 2023-04-17 DIAGNOSIS — I480 Paroxysmal atrial fibrillation: Secondary | ICD-10-CM

## 2023-04-17 MED ORDER — VEOZAH 45 MG PO TABS
45.0000 mg | ORAL_TABLET | Freq: Every day | ORAL | 3 refills | Status: DC
Start: 2023-04-17 — End: 2023-06-20

## 2023-04-17 MED ORDER — ALPRAZOLAM 0.5 MG PO TABS
ORAL_TABLET | ORAL | 2 refills | Status: DC
Start: 2023-04-17 — End: 2024-01-27

## 2023-04-17 NOTE — Assessment & Plan Note (Addendum)
Katherine Powell is not a candidate for estrogen replacement due to priro breast cancer. She has failed to have improvement with the use of an SNRI. I will see if we can get Katherine Powell approved for use of Veozah. If so, we will want to check LFTs in 3, 6, and 9 months.

## 2023-04-17 NOTE — Progress Notes (Signed)
[facility] [address] [place] [address] Dept: [phone] Dept Fax: [phone]  Chronic Care Office Visit  Subjective:    Patient ID: Katherine Powell, female    DOB: [birth year], 89 y.o..   MRN: [id number]  Chief Complaint  Patient presents with   Medical Management of Chronic Issues    F/u meds and c/o having hot flashes and cough x months.    History of Present Illness:  Patient is in today for reassessment of chronic medical issues.  Ms. Lisboa has a history of hypertension. She is managed on diltiazem CD 180 mg daily. She also has some moderate CKD.   Ms. Thiessen has a history of atrial fibrillation. She is managed on metoprolol tartrate 25 mg bid for rate control and flecainide 100 mg bid for rhythm control. She is on Eliquis 5 mg bid for anticoagulation.   Ms. Frangella has a history of anxiety. She was previously taking only Xanax about once a week for this. She is now managed on venlafaxine 75 mg daily.    Ms. Shelstad has a history of hot flashes. She notes she experiences this 7-8 times a day. She has a history of breast cancer, so has not been a candidate for estrogen replacement. She has been using venlefaxine, but has seen no improvements in her symptoms.  Past Medical History: Patient Active Problem List   Diagnosis Date Noted   Hot flashes 04/17/2023   Mitral regurgitation 03/15/2023   Coronary artery calcification 03/04/2023   Precordial chest pain 02/13/2023   Arthropathy of lumbar facet joint 02/13/2022   Osteoarthritis of both hands 08/16/2021   Long term (current) use of anticoagulants 04/14/2021   Personal history of colonic polyps 04/14/2021   Mixed incontinence 04/14/2021   History of cornea transplant 04/14/2021   Essential hypertension 04/14/2021   Degeneration of lumbar intervertebral disc 04/14/2021   Corneal transplant failure, right eye 04/14/2021   Chronic kidney disease,  stage 3a (HCC) 04/14/2021   Anxiety 04/14/2021   Allergic rhinitis 04/14/2021   Presbycusis of both ears 04/14/2021   Red blood cell antibody positive 12/05/2018   Multinodular goiter (nontoxic) 02/11/2017   Spinal stenosis of lumbar region 10/31/2016   Degenerative spondylolisthesis 10/11/2016   Osteopenia 07/18/2016   Lumbar radiculopathy 01/05/2015   Paroxysmal atrial fibrillation (HCC) 12/29/2014   Postmenopausal estrogen deficiency 01/27/2014   Malignant neoplasm of upper-outer quadrant of right breast in female, estrogen receptor positive (HCC) 11/17/2013   Past Surgical History:  Procedure Laterality Date   ABDOMINAL HYSTERECTOMY  1973   endometriosis   APPENDECTOMY  1954   BACK SURGERY     lumb-2005   BILATERAL OOPHORECTOMY     benign tumor on ovary   BREAST LUMPECTOMY WITH NEEDLE LOCALIZATION Right 11/09/2013   Procedure: BREAST LUMPECTOMY WITH NEEDLE LOCALIZATION;  Surgeon: [name], MD;  Location: [facility];  Service: General;  Laterality: Right;   CATARACT EXTRACTION  2013   both   CYSTOCELE REPAIR  2008   EYE SURGERY     cataracts-plugs for eyes   RECTOCELE REPAIR  2008   TONSILLECTOMY     Family History  Problem Relation Age of Onset   Alzheimer's disease Mother    Stroke Father    Heart attack Neg Hx    Outpatient Medications Prior to Visit  Medication Sig Dispense Refill   acetaminophen (TYLENOL) 650 MG CR tablet Take 650 mg by mouth every 8 (eight) hours as needed  for pain. Reported on 01/19/2016     apixaban (ELIQUIS) 5 MG TABS tablet Take 1 tablet (5 mg total) by mouth 2 (two) times daily. 180 tablet 1   calcium citrate (CALCITRATE - DOSED IN MG ELEMENTAL CALCIUM) 950 (200 Ca) MG tablet Take 200 mg of elemental calcium by mouth daily.     cycloSPORINE (RESTASIS) 0.05 % ophthalmic emulsion Place 1 drop into both eyes 2 (two) times daily as needed (dry eyes).     diltiazem (CARDIZEM CD) 180 MG 24 hr capsule Take 1 capsule (180 mg  total) by mouth daily. 90 capsule 3   diphenhydrAMINE (BENADRYL) 50 MG capsule Take 50 mg by mouth every 6 (six) hours as needed.     fexofenadine (ALLEGRA) 180 MG tablet Take 180 mg by mouth daily as needed (seasonal allergies).     flecainide (TAMBOCOR) 100 MG tablet TAKE 1 TABLET TWICE A DAY 180 tablet 3   Glucosamine HCl-MSM (GLUCOSAMINE-MSM PO) Take 1 tablet by mouth 2 (two) times daily.     melatonin 5 MG TABS Take 5 mg by mouth at bedtime as needed (sleep).     metoprolol tartrate (LOPRESSOR) 25 MG tablet Take 1 tablet (25 mg total) by mouth daily as needed. 90 tablet 1   Multiple Vitamins-Minerals (HAIR/SKIN/NAILS/BIOTIN) TABS Take 1 tablet by mouth 2 (two) times daily.     Multiple Vitamins-Minerals (PRESERVISION AREDS) TABS Take 1 tablet by mouth 2 (two) times daily.     prednisoLONE acetate (PRED FORTE) 1 % ophthalmic suspension Place 1 drop into the right eye 2 (two) times daily.     sodium chloride (OCEAN) 0.65 % nasal spray Place 1 spray into the nose 2 (two) times daily as needed for congestion. Reported on 01/19/2016     venlafaxine XR (EFFEXOR XR) 75 MG 24 hr capsule Take 1 capsule (75 mg total) by mouth daily with breakfast. 90 capsule 3   ALPRAZolam (XANAX) 0.5 MG tablet TAKE 1/2 TABLET(0.25 MG) BY MOUTH TWICE DAILY AS NEEDED FOR ANXIETY 30 tablet 2   No facility-administered medications prior to visit.   Allergies  Allergen Reactions   Codeine Nausea Only and Other (See Comments)   Gabapentin Other (See Comments)    Makes her very sleepy   Objective:   Today's Vitals   04/17/23 1309  BP: 118/66  Pulse: 69  Temp: 98.2 F (36.8 C)  TempSrc: Temporal  SpO2: 94%  Weight: 159 lb (72.1 kg)  Height: 5\' 6"  (1.676 m)   Body mass index is 25.66 kg/m.   General: Well developed, well nourished. No acute distress. Psych: Alert and oriented. Normal mood and affect.  Health Maintenance Due  Topic Date Due   Medicare Annual Wellness (AWV)  05/16/2023     Assessment &  Plan:   Problem List Items Addressed This Visit       Cardiovascular and Mediastinum   Paroxysmal atrial fibrillation (HCC)    Stable. Rate well controlled. Continue metoprolol tartrate 25 mg bid and flecainide 100 mg bid. Continue Eliquis 5 mg bid for anticoagulation.      Essential hypertension - Primary    Blood pressure is in good control. Continue diltiazem CD 180 mg daily.         Genitourinary   Chronic kidney disease, stage 3a (HCC)    Stable. BP is in good control.        Other   Anxiety    Continue venlafaxine 75 mg daily and judicious use of alprazolam.  Relevant Medications   ALPRAZolam (XANAX) 0.5 MG tablet   Vasomotor symptoms due to menopause    Ms. Kaupp is not a candidate for estrogen replacement due to priro breast cancer. She has failed to have improvement with the use of an SNRI. I will see if we can get Ms. Mazzeo approved for use of Veozah. If so, we will want to check LFTs in 3, 6, and 9 months.      Relevant Medications   Fezolinetant (VEOZAH) 45 MG TABS    Return in about 4 months (around 08/18/2023) for Reassessment.   Loyola Mast, MD

## 2023-04-17 NOTE — Assessment & Plan Note (Signed)
Stable. BP is in good control.

## 2023-04-17 NOTE — Assessment & Plan Note (Signed)
Stable. Rate well controlled. Continue metoprolol tartrate 25 mg bid and flecainide 100 mg bid. Continue Eliquis 5 mg bid for anticoagulation.

## 2023-04-17 NOTE — Assessment & Plan Note (Signed)
Continue venlafaxine 75 mg daily and judicious use of alprazolam.

## 2023-04-17 NOTE — Assessment & Plan Note (Signed)
Blood pressure is in good control. Continue diltiazem CD 180 mg daily.

## 2023-04-19 ENCOUNTER — Telehealth: Payer: Self-pay

## 2023-04-19 NOTE — Telephone Encounter (Signed)
PA for Veozah 45 mg faxed to [facility] at [phone].  Waiting on response. Dm/cma

## 2023-04-23 ENCOUNTER — Emergency Department (HOSPITAL_COMMUNITY)
Admission: EM | Admit: 2023-04-23 | Discharge: 2023-04-23 | Disposition: A | Discharge status: Home / Self Care | Payer: Medicare Other | Attending: Emergency Medicine | Admitting: Emergency Medicine

## 2023-04-23 ENCOUNTER — Emergency Department (HOSPITAL_COMMUNITY): Payer: Medicare Other

## 2023-04-23 ENCOUNTER — Other Ambulatory Visit: Payer: Self-pay

## 2023-04-23 DIAGNOSIS — N1831 Chronic kidney disease, stage 3a: Secondary | ICD-10-CM | POA: Diagnosis not present

## 2023-04-23 DIAGNOSIS — I10 Essential (primary) hypertension: Secondary | ICD-10-CM | POA: Diagnosis not present

## 2023-04-23 DIAGNOSIS — S0990XA Unspecified injury of head, initial encounter: Secondary | ICD-10-CM | POA: Diagnosis not present

## 2023-04-23 DIAGNOSIS — Z853 Personal history of malignant neoplasm of breast: Secondary | ICD-10-CM | POA: Diagnosis not present

## 2023-04-23 DIAGNOSIS — I129 Hypertensive chronic kidney disease with stage 1 through stage 4 chronic kidney disease, or unspecified chronic kidney disease: Secondary | ICD-10-CM | POA: Diagnosis not present

## 2023-04-23 DIAGNOSIS — W19XXXA Unspecified fall, initial encounter: Secondary | ICD-10-CM | POA: Diagnosis not present

## 2023-04-23 DIAGNOSIS — Z7901 Long term (current) use of anticoagulants: Secondary | ICD-10-CM | POA: Diagnosis not present

## 2023-04-23 NOTE — ED Notes (Signed)
Trauma Response Nurse Documentation   Katherine Powell is a 89 y.o. female arriving to [facility] ED via EMS  On Eliquis (apixaban) daily. Trauma was activated as a Level 2 by ED charge RN based on the following trauma criteria Elderly patients > 65 with head trauma on anti-coagulation (excluding ASA).  Patient cleared for CT by Dr. Eudelia Bunch EDP. Pt transported to CT with trauma response nurse present to monitor. RN remained with the patient throughout their absence from the department for clinical observation.   GCS 15.  History   Past Medical History:  Diagnosis Date   Acne rosacea 04/14/2021   Allergy    Anxiety    Arrhythmia    atrial fibrillation/  on    Arthritis    Atrial fibrillation (HCC)    Breast cancer (HCC) 11/09/2013   right upper outer   Contact lens/glasses fitting    contact rt eye   Coronary artery calcification 03/04/2023   CCTA 03/04/23: CAC score 27.2; total plaque volume is mild (8th percentile); min plaque LAD (<25)   Hx of radiation therapy 12/15/13- 01/11/14   right breast 4250 cGy in 17 sessions, (hypo-fractionated), no boost   Hypertension    Mitral regurgitation 03/15/2023   TTE 03/15/2023: EF 55-60, no RWMA, GLS -20.8, normal RVSF, normal PASP, RVSP 35.5, mild MR, mild AI, AV sclerosis, aortic root 4 cm, ascending aorta 3.7 cm (normal range), RAP 8   Postmenopausal    Rosacea    Seasonal allergies    Sleep disturbance    Thyroid disease    Urinary incontinence      Past Surgical History:  Procedure Laterality Date   ABDOMINAL HYSTERECTOMY  1973   endometriosis   APPENDECTOMY  1954   BACK SURGERY     lumb-2005   BILATERAL OOPHORECTOMY     benign tumor on ovary   BREAST LUMPECTOMY WITH NEEDLE LOCALIZATION Right 11/09/2013   Procedure: BREAST LUMPECTOMY WITH NEEDLE LOCALIZATION;  Surgeon: [name], MD;  Location: [facility];  Service: General;  Laterality: Right;   CATARACT EXTRACTION  2013   both   CYSTOCELE REPAIR  2008    EYE SURGERY     cataracts-plugs for eyes   RECTOCELE REPAIR  2008   TONSILLECTOMY         Initial Focused Assessment (If applicable, or please see trauma documentation): Alert/oriented female presents via EMS from home after a fall with head injury. No hematoma/obvious wounds. On Eliquis for AFIB. Denies pain, reports she didn't want to come but wanted to please her husband  Airway patent/unobstructed, BS clear No obvious uncontrolled hemorrhage GCS 15 irregular pupil on the right d/t cataract surgery, left pupil round and reactive 3  CT's Completed:   CT Head   Interventions:  XRAYS deferred by Dr Eudelia Bunch EDP Labs/IV deferred by Dr. Eudelia Bunch EDP CT head  Plan for disposition:  Discharge home   Consults completed:  none  Event Summary: Presents via EMS from home after a fall with head injury, hx AFIB on eliquis. No obvious injury/trauma noted. CT head completed, discharge home.   Bedside handoff with ED RN [facility].    Katherine Powell O Katherine Powell  Trauma Response RN  Please call TRN at [phone] for further assistance.

## 2023-04-23 NOTE — ED Notes (Incomplete)
Patient transported to CT 

## 2023-04-23 NOTE — ED Notes (Signed)
Dressing applied to right elbow of pt

## 2023-04-23 NOTE — ED Notes (Signed)
Patient transported back from CT 

## 2023-04-23 NOTE — ED Triage Notes (Signed)
Pt arrives to c/o mechanical fall. Pt hit head on possible baseboard, no LOC. Pt on blood thinners

## 2023-04-23 NOTE — ED Notes (Signed)
Patient transported to CT 

## 2023-04-23 NOTE — ED Provider Notes (Signed)
[place] EMERGENCY DEPARTMENT AT [facility] Provider Note  CSN: [id number] Arrival date & time: 04/23/23 5284  Chief Complaint(s) Fall  HPI Katherine Powell is a 89 y.o. female with a past medical history listed below including atrial fibrillation on Eliquis who presents to the emergency department after mechanical fall at home around 2:30 AM.  Patient endorses head trauma without loss of consciousness.  Denies any headache, neck pain, back pain, chest pain, abdominal pain, extremity pain.  She did sustain a skin tear in the right elbow which is nontender.  Patient's husband wanted her to be evaluated given her fall on thinners.  The history is provided by the patient and the EMS personnel.    Past Medical History Past Medical History:  Diagnosis Date   Acne rosacea 04/14/2021   Allergy    Anxiety    Arrhythmia    atrial fibrillation/  on    Arthritis    Atrial fibrillation (HCC)    Breast cancer (HCC) 11/09/2013   right upper outer   Contact lens/glasses fitting    contact rt eye   Coronary artery calcification 03/04/2023   CCTA 03/04/23: CAC score 27.2; total plaque volume is mild (8th percentile); min plaque LAD (<25)   Hx of radiation therapy 12/15/13- 01/11/14   right breast 4250 cGy in 17 sessions, (hypo-fractionated), no boost   Hypertension    Mitral regurgitation 03/15/2023   TTE 03/15/2023: EF 55-60, no RWMA, GLS -20.8, normal RVSF, normal PASP, RVSP 35.5, mild MR, mild AI, AV sclerosis, aortic root 4 cm, ascending aorta 3.7 cm (normal range), RAP 8   Postmenopausal    Rosacea    Seasonal allergies    Sleep disturbance    Thyroid disease    Urinary incontinence    Patient Active Problem List   Diagnosis Date Noted   Vasomotor symptoms due to menopause 04/17/2023   Mitral regurgitation 03/15/2023   Coronary artery calcification 03/04/2023   Precordial chest pain 02/13/2023   Arthropathy of lumbar facet joint 02/13/2022   Osteoarthritis of both hands  08/16/2021   Long term (current) use of anticoagulants 04/14/2021   Personal history of colonic polyps 04/14/2021   Mixed incontinence 04/14/2021   History of cornea transplant 04/14/2021   Essential hypertension 04/14/2021   Degeneration of lumbar intervertebral disc 04/14/2021   Corneal transplant failure, right eye 04/14/2021   Chronic kidney disease, stage 3a (HCC) 04/14/2021   Anxiety 04/14/2021   Allergic rhinitis 04/14/2021   Presbycusis of both ears 04/14/2021   Red blood cell antibody positive 12/05/2018   Multinodular goiter (nontoxic) 02/11/2017   Spinal stenosis of lumbar region 10/31/2016   Degenerative spondylolisthesis 10/11/2016   Osteopenia 07/18/2016   Lumbar radiculopathy 01/05/2015   Paroxysmal atrial fibrillation (HCC) 12/29/2014   Postmenopausal estrogen deficiency 01/27/2014   Malignant neoplasm of upper-outer quadrant of right breast in female, estrogen receptor positive (HCC) 11/17/2013   Home Medication(s) Prior to Admission medications   Medication Sig Start Date End Date Taking? Authorizing Provider  acetaminophen (TYLENOL) 650 MG CR tablet Take 650 mg by mouth every 8 (eight) hours as needed for pain. Reported on 01/19/2016    [provider]  ALPRAZolam Prudy Feeler) 0.5 MG tablet TAKE 1/2 TABLET(0.25 MG) BY MOUTH TWICE DAILY AS NEEDED FOR ANXIETY 04/17/23   Loyola Mast, MD  apixaban (ELIQUIS) 5 MG TABS tablet Take 1 tablet (5 mg total) by mouth 2 (two) times daily. 02/04/23   Meriam Sprague, MD  calcium citrate (CALCITRATE -  DOSED IN MG ELEMENTAL CALCIUM) 950 (200 Ca) MG tablet Take 200 mg of elemental calcium by mouth daily.    [provider]  cycloSPORINE (RESTASIS) 0.05 % ophthalmic emulsion Place 1 drop into both eyes 2 (two) times daily as needed (dry eyes).    [provider]  diltiazem (CARDIZEM CD) 180 MG 24 hr capsule Take 1 capsule (180 mg total) by mouth daily. 02/18/23   Tereso Newcomer T, PA-C  diphenhydrAMINE  (BENADRYL) 50 MG capsule Take 50 mg by mouth every 6 (six) hours as needed.    [provider]  fexofenadine (ALLEGRA) 180 MG tablet Take 180 mg by mouth daily as needed (seasonal allergies).    [provider]  Fezolinetant (VEOZAH) 45 MG TABS Take 1 tablet (45 mg total) by mouth daily at 12 noon. 04/17/23   Loyola Mast, MD  flecainide [facility]) 100 MG tablet TAKE 1 TABLET TWICE A DAY 10/08/22   Lyn Records, MD  Glucosamine HCl-MSM (GLUCOSAMINE-MSM PO) Take 1 tablet by mouth 2 (two) times daily.    [provider]  melatonin 5 MG TABS Take 5 mg by mouth at bedtime as needed (sleep).    [provider]  metoprolol tartrate (LOPRESSOR) 25 MG tablet Take 1 tablet (25 mg total) by mouth daily as needed. 02/13/23   Tereso Newcomer T, PA-C  Multiple Vitamins-Minerals (PRESERVISION AREDS) TABS Take 1 tablet by mouth 2 (two) times daily.    [provider]  prednisoLONE acetate (PRED FORTE) 1 % ophthalmic suspension Place 1 drop into the right eye 2 (two) times daily. 11/04/19   [provider]  sodium chloride (OCEAN) 0.65 % nasal spray Place 1 spray into the nose 2 (two) times daily as needed for congestion. Reported on 01/19/2016    [provider]  venlafaxine XR (EFFEXOR XR) 75 MG 24 hr capsule Take 1 capsule (75 mg total) by mouth daily with breakfast. 12/27/22   Loyola Mast, MD                                                                                                                                    Allergies Codeine and Gabapentin  Review of Systems Review of Systems As noted in HPI  Physical Exam Vital Signs  I have reviewed the triage vital signs BP (!) 188/68   Pulse (!) 59   Temp 97.8 F (36.6 C) (Oral)   Resp 18   Ht 5\' 6"  (1.676 m)   Wt 71 kg   SpO2 96%   BMI 25.26 kg/m   Physical Exam Constitutional:      General: She is not in acute distress.    Appearance: She is well-developed. She is not  diaphoretic.  HENT:     Head: Normocephalic and atraumatic.     Right Ear: External ear normal.     Left Ear: External ear normal.  Nose: Nose normal.  Eyes:     General: No scleral icterus.       Right eye: No discharge.        Left eye: No discharge.     Conjunctiva/sclera: Conjunctivae normal.     Pupils: Pupils are equal, round, and reactive to light.  Cardiovascular:     Rate and Rhythm: Normal rate and regular rhythm.     Pulses:          Radial pulses are 2+ on the right side and 2+ on the left side.       Dorsalis pedis pulses are 2+ on the right side and 2+ on the left side.     Heart sounds: Normal heart sounds. No murmur heard.    No friction rub. No gallop.  Pulmonary:     Effort: Pulmonary effort is normal. No respiratory distress.     Breath sounds: Normal breath sounds. No stridor. No wheezing.  Abdominal:     General: There is no distension.     Palpations: Abdomen is soft.     Tenderness: There is no abdominal tenderness.  Musculoskeletal:        General: No tenderness.     Right elbow: Normal range of motion. No tenderness.     Left elbow: Normal range of motion. No tenderness.       Arms:     Cervical back: Normal range of motion and neck supple. No bony tenderness.     Thoracic back: No bony tenderness.     Lumbar back: No bony tenderness.     Comments: Clavicles stable. Chest stable to AP/Lat compression. Pelvis stable to Lat compression. No obvious extremity deformity. No chest or abdominal wall contusion.  Skin:    General: Skin is warm and dry.     Findings: No erythema or rash.  Neurological:     Mental Status: She is alert and oriented to person, place, and time.     Comments: Moving all extremities     ED Results and Treatments Labs (all labs ordered are listed, but only abnormal results are displayed) Labs Reviewed - No data to display                                                                                                                        EKG  EKG Interpretation  Date/Time:    Ventricular Rate:    PR Interval:    QRS Duration:   QT Interval:    QTC Calculation:   R Axis:     Text Interpretation:         Radiology CT Head Wo Contrast  Result Date: 04/23/2023 CLINICAL DATA:  Head trauma with ataxia. EXAM: CT HEAD WITHOUT CONTRAST TECHNIQUE: Contiguous axial images were obtained from the base of the skull through the vertex without intravenous contrast. RADIATION DOSE REDUCTION: This exam was performed according to the departmental dose-optimization program which includes automated exposure control, adjustment of the mA and/or kV  according to patient size and/or use of iterative reconstruction technique. COMPARISON:  None Available. FINDINGS: Brain: No evidence of acute infarction, hemorrhage, hydrocephalus, extra-axial collection or mass lesion/mass effect. Generalized atrophy. Mild for age chronic small vessel ischemia in the cerebral white matter. Coarse calcification in the right frontal white matter, nonspecific but also non worrisome in isolation. Vascular: No hyperdense vessel or unexpected calcification. Skull: Normal. Negative for fracture or focal lesion. Sinuses/Orbits: No evidence of injury IMPRESSION: No evidence of intracranial injury. Electronically Signed   By: Tiburcio Pea M.D.   On: 04/23/2023 04:02    Medications Ordered in ED Medications - No data to display Procedures Procedures  (including critical care time) Medical Decision Making / ED Course  Click here for ABCD2, HEART and other calculators  Medical Decision Making Amount and/or Complexity of Data Reviewed Radiology: ordered and independent interpretation performed. Decision-making details documented in ED Course.    Level 2 fall on thinners ABCs intact Secondary as above No significant injuries on exam CT head negative for ICH Skin tear was cleaned and dressed.  No need for primary closure.  Patient has no tenderness  to palpation of the elbow that would be concerning for any fracture/dislocation.     Final Clinical Impression(s) / ED Diagnoses Final diagnoses:  Fall in home, initial encounter  Minor head injury, initial encounter   The patient appears reasonably screened and/or stabilized for discharge and I doubt any other medical condition or other [facility] requiring further screening, evaluation, or treatment in the ED at this time. I have discussed the findings, Dx and Tx plan with the patient/family who expressed understanding and agree(s) with the plan. Discharge instructions discussed at length. The patient/family was given strict return precautions who verbalized understanding of the instructions. No further questions at time of discharge.  Disposition: Discharge  Condition: Good  ED Discharge Orders     None         Follow Up: Loyola Mast, MD [address] [place] [address] [phone]  Call  as needed           This chart was dictated using voice recognition software.  Despite best efforts to proofread,  errors can occur which can change the documentation meaning.    Nira Conn, MD 04/23/23 [id number]

## 2023-05-13 DIAGNOSIS — H18211 Corneal edema secondary to contact lens, right eye: Secondary | ICD-10-CM | POA: Diagnosis not present

## 2023-05-20 ENCOUNTER — Ambulatory Visit (INDEPENDENT_AMBULATORY_CARE_PROVIDER_SITE_OTHER): Payer: Medicare Other

## 2023-05-20 VITALS — BP 114/60 | HR 67 | Temp 97.8°F | Ht 65.5 in | Wt 159.0 lb

## 2023-05-20 DIAGNOSIS — Z0000 Encounter for general adult medical examination without abnormal findings: Secondary | ICD-10-CM | POA: Diagnosis not present

## 2023-05-20 NOTE — Progress Notes (Signed)
Subjective:   Katherine Powell is a 89 y.o. female who presents for Medicare Annual (Subsequent) preventive examination.  Review of Systems     Cardiac Risk Factors include: advanced age (>55men, >30 women);hypertension     Objective:    Today's Vitals   05/20/23 1004  BP: 114/60  Pulse: 67  Temp: 97.8 F (36.6 C)  TempSrc: Oral  SpO2: 95%  Weight: 159 lb (72.1 kg)  Height: 5' 5.5" (1.664 m)   Body mass index is 26.06 kg/m.     05/20/2023   10:20 AM 05/15/2022   11:08 AM 02/03/2019    9:21 AM 12/24/2017    8:52 AM 01/17/2017    4:23 PM 08/29/2016    3:24 PM 07/18/2016    7:33 PM  Advanced Directives  Does Patient Have a Medical Advance Directive? Yes Yes No No No No Yes  Type of Estate agent of [place];Living will Healthcare Power of [place];Living will       Copy of Healthcare Power of Attorney in Chart? No - copy requested No - copy requested       Would patient like information on creating a medical advance directive?   No - Patient declined   No - patient declined information     Current Medications (verified) Outpatient Encounter Medications as of 05/20/2023  Medication Sig   acetaminophen (TYLENOL) 650 MG CR tablet Take 650 mg by mouth every 8 (eight) hours as needed for pain. Reported on 01/19/2016   ALPRAZolam (XANAX) 0.5 MG tablet TAKE 1/2 TABLET(0.25 MG) BY MOUTH TWICE DAILY AS NEEDED FOR ANXIETY   apixaban (ELIQUIS) 5 MG TABS tablet Take 1 tablet (5 mg total) by mouth 2 (two) times daily.   calcium citrate (CALCITRATE - DOSED IN MG ELEMENTAL CALCIUM) 950 (200 Ca) MG tablet Take 200 mg of elemental calcium by mouth daily.   cycloSPORINE (RESTASIS) 0.05 % ophthalmic emulsion Place 1 drop into both eyes 2 (two) times daily as needed (dry eyes).   diltiazem (CARDIZEM CD) 180 MG 24 hr capsule Take 1 capsule (180 mg total) by mouth daily.   diphenhydrAMINE (BENADRYL) 50 MG capsule Take 50 mg by mouth every 6 (six) hours as needed.    fexofenadine (ALLEGRA) 180 MG tablet Take 180 mg by mouth daily as needed (seasonal allergies).   Fezolinetant (VEOZAH) 45 MG TABS Take 1 tablet (45 mg total) by mouth daily at 12 noon.   flecainide (TAMBOCOR) 100 MG tablet TAKE 1 TABLET TWICE A DAY   Glucosamine HCl-MSM (GLUCOSAMINE-MSM PO) Take 1 tablet by mouth 2 (two) times daily.   melatonin 5 MG TABS Take 5 mg by mouth at bedtime as needed (sleep).   metoprolol tartrate (LOPRESSOR) 25 MG tablet Take 1 tablet (25 mg total) by mouth daily as needed.   Multiple Vitamins-Minerals (PRESERVISION AREDS) TABS Take 1 tablet by mouth 2 (two) times daily.   prednisoLONE acetate (PRED FORTE) 1 % ophthalmic suspension Place 1 drop into the right eye 2 (two) times daily.   sodium chloride (OCEAN) 0.65 % nasal spray Place 1 spray into the nose 2 (two) times daily as needed for congestion. Reported on 01/19/2016   venlafaxine XR (EFFEXOR XR) 75 MG 24 hr capsule Take 1 capsule (75 mg total) by mouth daily with breakfast.   No facility-administered encounter medications on file as of 05/20/2023.    Allergies (verified) Codeine and Gabapentin   History: Past Medical History:  Diagnosis Date   Acne rosacea 04/14/2021  Allergy    Anxiety    Arrhythmia    atrial fibrillation/  on    Arthritis    Atrial fibrillation (HCC)    Breast cancer (HCC) 11/09/2013   right upper outer   Contact lens/glasses fitting    contact rt eye   Coronary artery calcification 03/04/2023   CCTA 03/04/23: CAC score 27.2; total plaque volume is mild (8th percentile); min plaque LAD (<25)   Hx of radiation therapy 12/15/13- 01/11/14   right breast 4250 cGy in 17 sessions, (hypo-fractionated), no boost   Hypertension    Mitral regurgitation 03/15/2023   TTE 03/15/2023: EF 55-60, no RWMA, GLS -20.8, normal RVSF, normal PASP, RVSP 35.5, mild MR, mild AI, AV sclerosis, aortic root 4 cm, ascending aorta 3.7 cm (normal range), RAP 8   Postmenopausal    Rosacea    Seasonal allergies     Sleep disturbance    Thyroid disease    Urinary incontinence    Past Surgical History:  Procedure Laterality Date   ABDOMINAL HYSTERECTOMY  1973   endometriosis   APPENDECTOMY  1954   BACK SURGERY     lumb-2005   BILATERAL OOPHORECTOMY     benign tumor on ovary   BREAST LUMPECTOMY WITH NEEDLE LOCALIZATION Right 11/09/2013   Procedure: BREAST LUMPECTOMY WITH NEEDLE LOCALIZATION;  Surgeon: [name], MD;  Location: [facility];  Service: General;  Laterality: Right;   CATARACT EXTRACTION  2013   both   CYSTOCELE REPAIR  2008   EYE SURGERY     cataracts-plugs for eyes   RECTOCELE REPAIR  2008   TONSILLECTOMY     Family History  Problem Relation Age of Onset   Alzheimer's disease Mother    Stroke Father    Heart attack Neg Hx    Social History   Socioeconomic History   Marital status: Married    Spouse name: Not on file   Number of children: 3   Years of education: Not on file   Highest education level: Not on file  Occupational History   Not on file  Tobacco Use   Smoking status: Never   Smokeless tobacco: Never  Vaping Use   Vaping Use: Never used  Substance and Sexual Activity   Alcohol use: Yes    Comment: wine once a month   Drug use: No   Sexual activity: Never    Comment: menarche age 34, P65, first live birth age 45,  HRT x 30 years  Other Topics Concern   Not on file  Social History Narrative   2nd marriage.   First husband is deceased.   7 grandchildren   7 great grandchildren   Social Determinants of Health   Financial Resource Strain: Low Risk  (05/20/2023)   Overall Financial Resource Strain (CARDIA)    Difficulty of Paying Living Expenses: Not hard at all  Food Insecurity: No Food Insecurity (05/20/2023)   Hunger Vital Sign    Worried About Running Out of Food in the Last Year: Never true    Ran Out of Food in the Last Year: Never true  Transportation Needs: No Transportation Needs (05/20/2023)   PRAPARE -  Administrator, Civil Service (Medical): No    Lack of Transportation (Non-Medical): No  Physical Activity: Sufficiently Active (05/20/2023)   Exercise Vital Sign    Days of Exercise per Week: 3 days    Minutes of Exercise per Session: 50 min  Stress: Stress Concern Present (05/20/2023)  [employer] of Occupational Health - Occupational Stress Questionnaire    Feeling of Stress : To some extent  Social Connections: Patient Declined (05/20/2023)   Social Connection and Isolation Panel [NHANES]    Frequency of Communication with Friends and Family: Patient declined    Frequency of Social Gatherings with Friends and Family: Patient declined    Attends Religious Services: Patient declined    Database administrator or Organizations: Patient declined    Attends Engineer, structural: Patient declined    Marital Status: Patient declined    Tobacco Counseling Counseling given: Not Answered   Clinical Intake:  Pre-visit preparation completed: Yes  Pain : No/denies pain     Nutritional Status: BMI 25 -29 Overweight Diabetes: No  How often do you need to have someone help you when you read instructions, pamphlets, or other written materials from your doctor or pharmacy?: 1 - Never  Diabetic? no  Interpreter Needed?: No  Information entered by :: NAllen LPN   Activities of Daily Living    05/20/2023   10:22 AM  In your present state of health, do you have any difficulty performing the following activities:  Hearing? 1  Comment has hearing aids  Vision? 1  Comment blurry right eye  Difficulty concentrating or making decisions? 1  Walking or climbing stairs? 0  Dressing or bathing? 0  Doing errands, shopping? 1  Preparing Food and eating ? N  Using the Toilet? N  In the past six months, have you accidently leaked urine? Y  Do you have problems with loss of bowel control? N  Managing your Medications? N  Managing your Finances? N  Housekeeping or  managing your Housekeeping? N    Patient Care Team: Loyola Mast, MD as PCP - General (Family Medicine) Meriam Sprague, MD as PCP - Cardiology (Cardiology) Magrinat, Valentino Hue, MD (Inactive) as Consulting Physician (Oncology) Abigail Miyamoto, MD as Consulting Physician (General Surgery) Phylliss Bob, MD as Referring Physician (Ophthalmology) Lyn Records, MD (Inactive) as Consulting Physician (Cardiology) Sallye Lat, MD as Consulting Physician (Ophthalmology)  Indicate any recent Medical Services you may have received from other than [facility] providers in the past year (date may be approximate).     Assessment:   This is a routine wellness examination for [place].  Hearing/Vision screen Vision Screening - Comments:: Regular eye exams, Dr. Dione Booze  Dietary issues and exercise activities discussed: Current Exercise Habits: Home exercise routine, Type of exercise: strength training/weights;calisthenics, Time (Minutes): 45, Frequency (Times/Week): 3, Weekly Exercise (Minutes/Week): 135   Goals Addressed             This Visit's Progress    Patient Stated       05/20/2023, wants to lose weight       Depression Screen    05/20/2023   10:22 AM 12/27/2022   10:30 AM 05/15/2022   11:09 AM 05/15/2022   11:07 AM 02/13/2022   10:53 AM 04/14/2021    9:06 AM  PHQ 2/9 Scores  PHQ - 2 Score 0 0 0 0 0 0    Fall Risk    05/20/2023   10:21 AM 12/27/2022   10:30 AM 09/21/2022    1:45 PM 05/15/2022   11:09 AM 02/13/2022   10:53 AM  Fall Risk   Falls in the past year? 1 1 1  0 0  Comment tripped going to bathroom middle of the night      Number falls in past yr: 0 0  1 0 0  Injury with Fall? 0 0 1 0 0  Risk for fall due to : Medication side effect No Fall Risks   No Fall Risks  Follow up Falls prevention discussed;Education provided;Falls evaluation completed Falls evaluation completed  Falls evaluation completed;Education provided Falls evaluation completed     FALL RISK PREVENTION PERTAINING TO THE HOME:  Any stairs in or around the home? Yes  If so, are there any without handrails? No  Home free of loose throw rugs in walkways, pet beds, electrical cords, etc? Yes  Adequate lighting in your home to reduce risk of falls? Yes   ASSISTIVE DEVICES UTILIZED TO PREVENT FALLS:  Life alert? No  Use of a cane, walker or w/c? No  Grab bars in the bathroom? Yes  Shower chair or bench in shower? Yes  Elevated toilet seat or a handicapped toilet? Yes   TIMED UP AND GO:  Was the test performed? Yes .  Length of time to ambulate 10 feet: 5 sec.   Gait slow and steady without use of assistive device  Cognitive Function:        05/20/2023   10:25 AM  6CIT Screen  What Year? 4 points  What month? 0 points  What time? 0 points  Count back from 20 2 points  Months in reverse 0 points  Repeat phrase 0 points  Total Score 6 points    Immunizations Immunization History  Administered Date(s) Administered   COVID-19, mRNA, vaccine(Comirnaty)12 years and older 10/03/2022   Fluad Quad(high Dose 65+) 08/16/2021   Influenza,inj,Quad PF,6+ Mos 10/12/2015   Influenza-Unspecified 09/10/2022   Moderna Covid-19 Vaccine Bivalent Booster 68yrs & up 03/22/2021, 08/24/2021   PFIZER(Purple Top)SARS-COV-2 Vaccination 12/23/2019, 01/13/2020, 08/29/2020, 10/20/2020, 03/22/2021   Pneumococcal Conjugate-13 10/05/2014   Pneumococcal Polysaccharide-23 08/22/2009   Respiratory Syncytial Virus Vaccine,Recomb Aduvanted(Arexvy) 08/09/2022   Tdap 09/13/2006, 01/05/2019   Zoster Recombinat (Shingrix) 09/02/2018, 01/23/2019   Zoster, Live 09/13/2006, 09/02/2018, 01/23/2019    TDAP status: Up to date  Flu Vaccine status: Up to date  Pneumococcal vaccine status: Up to date  Covid-19 vaccine status: Completed vaccines  Qualifies for Shingles Vaccine? Yes   Zostavax completed Yes   Shingrix Completed?: Yes  Screening Tests Health Maintenance  Topic Date  Due   COVID-19 Vaccine (9 - 2023-24 season) 11/28/2022   INFLUENZA VACCINE  07/04/2023   Medicare Annual Wellness (AWV)  05/19/2024   DTaP/Tdap/Td (3 - Td or Tdap) 01/05/2029   Pneumonia Vaccine 67+ Years old  Completed   DEXA SCAN  Completed   Zoster Vaccines- Shingrix  Completed   HPV VACCINES  Aged Out    Health Maintenance  Health Maintenance Due  Topic Date Due   COVID-19 Vaccine (9 - 2023-24 season) 11/28/2022    Colorectal cancer screening: No longer required.   Mammogram status: Completed 01/22/2023. Repeat every year  Bone Density status: Completed 04/01/2018.   Lung Cancer Screening: (Low Dose CT Chest recommended if Age 18-80 years, 30 pack-year currently smoking OR have quit w/in 15years.) does not qualify.   Lung Cancer Screening Referral: no  Additional Screening:  Hepatitis C Screening: does not qualify;   Vision Screening: Recommended annual ophthalmology exams for early detection of glaucoma and other disorders of the eye. Is the patient up to date with their annual eye exam?  Yes  Who is the provider or what is the name of the office in which the patient attends annual eye exams? [facility] If pt is not  established with a provider, would they like to be referred to a provider to establish care? No .   Dental Screening: Recommended annual dental exams for proper oral hygiene  Community Resource Referral / Chronic Care Management: CRR required this visit?  No   CCM required this visit?  No      Plan:     I have personally reviewed and noted the following in the patient's chart:   Medical and social history Use of alcohol, tobacco or illicit drugs  Current medications and supplements including opioid prescriptions. Patient is not currently taking opioid prescriptions. Functional ability and status Nutritional status Physical activity Advanced directives List of other physicians Hospitalizations, surgeries, and ER visits in previous 12  months Vitals Screenings to include cognitive, depression, and falls Referrals and appointments  In addition, I have reviewed and discussed with patient certain preventive protocols, quality metrics, and best practice recommendations. A written personalized care plan for preventive services as well as general preventive health recommendations were provided to patient.     Barb Merino, LPN   1/61/0960   Nurse Notes: concerned about memory. Appointment scheduled for 05/23/2023

## 2023-05-20 NOTE — Patient Instructions (Addendum)
Katherine Powell , Thank you for taking time to come for your Medicare Wellness Visit. I appreciate your ongoing commitment to your health goals. Please review the following plan we discussed and let me know if I can assist you in the future.   These are the goals we discussed:  Goals      Patient Stated     05/20/2023, wants to lose weight        This is a list of the screening recommended for you and due dates:  Health Maintenance  Topic Date Due   COVID-19 Vaccine (9 - 2023-24 season) 11/28/2022   Flu Shot  07/04/2023   Medicare Annual Wellness Visit  05/19/2024   DTaP/Tdap/Td vaccine (3 - Td or Tdap) 01/05/2029   Pneumonia Vaccine  Completed   DEXA scan (bone density measurement)  Completed   Zoster (Shingles) Vaccine  Completed   HPV Vaccine  Aged Out    Advanced directives: Please bring a copy of your POA (Power of [place]) and/or Living Will to your next appointment.   Conditions/risks identified: none  Next appointment: Follow up in one year for your annual wellness visit    Preventive Care 65 Years and Older, Female Preventive care refers to lifestyle choices and visits with your health care provider that can promote health and wellness. What does preventive care include? A yearly physical exam. This is also called an annual well check. Dental exams once or twice a year. Routine eye exams. Ask your health care provider how often you should have your eyes checked. Personal lifestyle choices, including: Daily care of your teeth and gums. Regular physical activity. Eating a healthy diet. Avoiding tobacco and drug use. Limiting alcohol use. Practicing safe sex. Taking low-dose aspirin every day. Taking vitamin and mineral supplements as recommended by your health care provider. What happens during an annual well check? The services and screenings done by your health care provider during your annual well check will depend on your age, overall health, lifestyle risk  factors, and family history of disease. Counseling  Your health care provider may ask you questions about your: Alcohol use. Tobacco use. Drug use. Emotional well-being. Home and relationship well-being. Sexual activity. Eating habits. History of falls. Memory and ability to understand (cognition). Work and work Astronomer. Reproductive health. Screening  You may have the following tests or measurements: Height, weight, and BMI. Blood pressure. Lipid and cholesterol levels. These may be checked every 5 years, or more frequently if you are over 33 years old. Skin check. Lung cancer screening. You may have this screening every year starting at age 3 if you have a 30-pack-year history of smoking and currently smoke or have quit within the past 15 years. Fecal occult blood test (FOBT) of the stool. You may have this test every year starting at age 16. Flexible sigmoidoscopy or colonoscopy. You may have a sigmoidoscopy every 5 years or a colonoscopy every 10 years starting at age 34. Hepatitis C blood test. Hepatitis B blood test. Sexually transmitted disease (STD) testing. Diabetes screening. This is done by checking your blood sugar (glucose) after you have not eaten for a while (fasting). You may have this done every 1-3 years. Bone density scan. This is done to screen for osteoporosis. You may have this done starting at age 71. Mammogram. This may be done every 1-2 years. Talk to your health care provider about how often you should have regular mammograms. Talk with your health care provider about your test results, treatment  options, and if necessary, the need for more tests. Vaccines  Your health care provider may recommend certain vaccines, such as: Influenza vaccine. This is recommended every year. Tetanus, diphtheria, and acellular pertussis (Tdap, Td) vaccine. You may need a Td booster every 10 years. Zoster vaccine. You may need this after age 52. Pneumococcal 13-valent  conjugate (PCV13) vaccine. One dose is recommended after age 58. Pneumococcal polysaccharide (PPSV23) vaccine. One dose is recommended after age 31. Talk to your health care provider about which screenings and vaccines you need and how often you need them. This information is not intended to replace advice given to you by your health care provider. Make sure you discuss any questions you have with your health care provider. Document Released: 12/16/2015 Document Revised: 08/08/2016 Document Reviewed: 09/20/2015 Elsevier Interactive Patient Education  2017 [place] Prevention in the Home Falls can cause injuries. They can happen to people of all ages. There are many things you can do to make your home safe and to help prevent falls. What can I do on the outside of my home? Regularly fix the edges of walkways and driveways and fix any cracks. Remove anything that might make you trip as you walk through a door, such as a raised step or threshold. Trim any bushes or trees on the path to your home. Use bright outdoor lighting. Clear any walking paths of anything that might make someone trip, such as rocks or tools. Regularly check to see if handrails are loose or broken. Make sure that both sides of any steps have handrails. Any raised decks and porches should have guardrails on the edges. Have any leaves, snow, or ice cleared regularly. Use sand or salt on walking paths during winter. Clean up any spills in your garage right away. This includes oil or grease spills. What can I do in the bathroom? Use night lights. Install grab bars by the toilet and in the tub and shower. Do not use towel bars as grab bars. Use non-skid mats or decals in the tub or shower. If you need to sit down in the shower, use a plastic, non-slip stool. Keep the floor dry. Clean up any water that spills on the floor as soon as it happens. Remove soap buildup in the tub or shower regularly. Attach bath mats  securely with double-sided non-slip rug tape. Do not have throw rugs and other things on the floor that can make you trip. What can I do in the bedroom? Use night lights. Make sure that you have a light by your bed that is easy to reach. Do not use any sheets or blankets that are too big for your bed. They should not hang down onto the floor. Have a firm chair that has side arms. You can use this for support while you get dressed. Do not have throw rugs and other things on the floor that can make you trip. What can I do in the kitchen? Clean up any spills right away. Avoid walking on wet floors. Keep items that you use a lot in easy-to-reach places. If you need to reach something above you, use a strong step stool that has a grab bar. Keep electrical cords out of the way. Do not use floor polish or wax that makes floors slippery. If you must use wax, use non-skid floor wax. Do not have throw rugs and other things on the floor that can make you trip. What can I do with my stairs? Do not  leave any items on the stairs. Make sure that there are handrails on both sides of the stairs and use them. Fix handrails that are broken or loose. Make sure that handrails are as long as the stairways. Check any carpeting to make sure that it is firmly attached to the stairs. Fix any carpet that is loose or worn. Avoid having throw rugs at the top or bottom of the stairs. If you do have throw rugs, attach them to the floor with carpet tape. Make sure that you have a light switch at the top of the stairs and the bottom of the stairs. If you do not have them, ask someone to add them for you. What else can I do to help prevent falls? Wear shoes that: Do not have high heels. Have rubber bottoms. Are comfortable and fit you well. Are closed at the toe. Do not wear sandals. If you use a stepladder: Make sure that it is fully opened. Do not climb a closed stepladder. Make sure that both sides of the stepladder  are locked into place. Ask someone to hold it for you, if possible. Clearly mark and make sure that you can see: Any grab bars or handrails. First and last steps. Where the edge of each step is. Use tools that help you move around (mobility aids) if they are needed. These include: Canes. Walkers. Scooters. Crutches. Turn on the lights when you go into a dark area. Replace any light bulbs as soon as they burn out. Set up your furniture so you have a clear path. Avoid moving your furniture around. If any of your floors are uneven, fix them. If there are any pets around you, be aware of where they are. Review your medicines with your doctor. Some medicines can make you feel dizzy. This can increase your chance of falling. Ask your doctor what other things that you can do to help prevent falls. This information is not intended to replace advice given to you by your health care provider. Make sure you discuss any questions you have with your health care provider. Document Released: 09/15/2009 Document Revised: 04/26/2016 Document Reviewed: 12/24/2014 Elsevier Interactive Patient Education  2017 [employer].

## 2023-05-21 DIAGNOSIS — D1721 Benign lipomatous neoplasm of skin and subcutaneous tissue of right arm: Secondary | ICD-10-CM | POA: Diagnosis not present

## 2023-05-21 DIAGNOSIS — L821 Other seborrheic keratosis: Secondary | ICD-10-CM | POA: Diagnosis not present

## 2023-05-21 DIAGNOSIS — Z85828 Personal history of other malignant neoplasm of skin: Secondary | ICD-10-CM | POA: Diagnosis not present

## 2023-05-23 ENCOUNTER — Ambulatory Visit (INDEPENDENT_AMBULATORY_CARE_PROVIDER_SITE_OTHER): Payer: Medicare Other | Admitting: Family Medicine

## 2023-05-23 ENCOUNTER — Encounter: Payer: Self-pay | Admitting: Family Medicine

## 2023-05-23 VITALS — BP 122/76 | HR 63 | Temp 97.3°F | Ht 65.5 in | Wt 159.4 lb

## 2023-05-23 DIAGNOSIS — N951 Menopausal and female climacteric states: Secondary | ICD-10-CM | POA: Diagnosis not present

## 2023-05-23 DIAGNOSIS — G3184 Mild cognitive impairment, so stated: Secondary | ICD-10-CM | POA: Diagnosis not present

## 2023-05-23 NOTE — Assessment & Plan Note (Signed)
Ms. Wignall's difficulty with recalling conversations and mild decline in orientation to time and recall are consistent with mild cognitive impairment. We discussed that there is not consistent evidence for use of supplements or vitamins to prevent the development of dementia. I did encourage her to engage in regular cognitive activities to help improve memory. She would not be indicated to start a medication at this point. I recommend we repeat her MMSE in 6-12 months.

## 2023-05-23 NOTE — Assessment & Plan Note (Signed)
Much improved on Veozah 45 mg daily. We will want to check LFTs in 3, 6, and 9 months from starting.

## 2023-05-23 NOTE — Progress Notes (Signed)
[facility] [address] [place] [address] Dept: [phone] Dept Fax: [phone]  Office Visit  Subjective:    Patient ID: Katherine Powell, female    DOB: 04-05-1932, 89 y.o..   MRN: [id number]  Chief Complaint  Patient presents with   Memory Loss    Concerns with short term memory issues   History of Present Illness:  Patient is in today with her husband. He reports concerns for her memory. He relates situations where He feels Katherine Powell doesn't recall conversations 5 minutes after they have them. Or seems not to recall news-worthy events that they have discussed in the past. Katherine Powell notes she does have difficulty with remembering names of people, though she always recalls their face. She denies any difficulty with instrumental ADLs.  Katherine Powell has a history of hot flashes. At her last visit, she noted she experienced this 7-8 times a day. She has a history of breast cancer, so has not been a candidate for estrogen replacement. She has been using venlefaxine, but had seen no improvements in her symptoms. We started her on fezolinetant Allyne Powell). She finds this is working quite well for her. She is having only a single episode of hot flashes at night.   Past Medical History: Patient Active Problem List   Diagnosis Date Noted   Vasomotor symptoms due to menopause 04/17/2023   Mitral regurgitation 03/15/2023   Coronary artery calcification 03/04/2023   Precordial chest pain 02/13/2023   Arthropathy of lumbar facet joint 02/13/2022   Osteoarthritis of both hands 08/16/2021   Long term (current) use of anticoagulants 04/14/2021   Personal history of colonic polyps 04/14/2021   Mixed incontinence 04/14/2021   History of cornea transplant 04/14/2021   Essential hypertension 04/14/2021   Degeneration of lumbar intervertebral disc 04/14/2021   Corneal transplant failure, right eye 04/14/2021   Chronic kidney  disease, stage 3a (HCC) 04/14/2021   Anxiety 04/14/2021   Allergic rhinitis 04/14/2021   Presbycusis of both ears 04/14/2021   Red blood cell antibody positive 12/05/2018   Multinodular goiter (nontoxic) 02/11/2017   Spinal stenosis of lumbar region 10/31/2016   Degenerative spondylolisthesis 10/11/2016   Osteopenia 07/18/2016   Lumbar radiculopathy 01/05/2015   Paroxysmal atrial fibrillation (HCC) 12/29/2014   Postmenopausal estrogen deficiency 01/27/2014   Malignant neoplasm of upper-outer quadrant of right breast in female, estrogen receptor positive (HCC) 11/17/2013   Past Surgical History:  Procedure Laterality Date   ABDOMINAL HYSTERECTOMY  1973   endometriosis   APPENDECTOMY  1954   BACK SURGERY     lumb-2005   BILATERAL OOPHORECTOMY     benign tumor on ovary   BREAST LUMPECTOMY WITH NEEDLE LOCALIZATION Right 11/09/2013   Procedure: BREAST LUMPECTOMY WITH NEEDLE LOCALIZATION;  Surgeon: [name], MD;  Location:  S[facility]ERY [facility];  Service: General;  Laterality: Right;   CATARACT EXTRACTION  2013   both   CYSTOCELE REPAIR  2008   EYE SURGERY     cataracts-plugs for eyes   RECTOCELE REPAIR  2008   TONSILLECTOMY     Family History  Problem Relation Age of Onset   Alzheimer's disease Mother    Stroke Father    Heart attack Neg Hx    Outpatient Medications Prior to Visit  Medication Sig Dispense Refill   acetaminophen (TYLENOL) 650 MG CR tablet Take 650 mg by mouth every 8 (eight) hours as needed for pain. Reported on 01/19/2016     ALPRAZolam Prudy Feeler)  0.5 MG tablet TAKE 1/2 TABLET(0.25 MG) BY MOUTH TWICE DAILY AS NEEDED FOR ANXIETY 30 tablet 2   apixaban (ELIQUIS) 5 MG TABS tablet Take 1 tablet (5 mg total) by mouth 2 (two) times daily. 180 tablet 1   calcium citrate (CALCITRATE - DOSED IN MG ELEMENTAL CALCIUM) 950 (200 Ca) MG tablet Take 200 mg of elemental calcium by mouth daily.     cycloSPORINE (RESTASIS) 0.05 % ophthalmic emulsion Place 1 drop into  both eyes 2 (two) times daily as needed (dry eyes).     diltiazem (CARDIZEM CD) 180 MG 24 hr capsule Take 1 capsule (180 mg total) by mouth daily. 90 capsule 3   diphenhydrAMINE (BENADRYL) 50 MG capsule Take 50 mg by mouth every 6 (six) hours as needed.     fexofenadine (ALLEGRA) 180 MG tablet Take 180 mg by mouth daily as needed (seasonal allergies).     Fezolinetant (VEOZAH) 45 MG TABS Take 1 tablet (45 mg total) by mouth daily at 12 noon. 90 tablet 3   flecainide (TAMBOCOR) 100 MG tablet TAKE 1 TABLET TWICE A DAY 180 tablet 3   Glucosamine HCl-MSM (GLUCOSAMINE-MSM PO) Take 1 tablet by mouth 2 (two) times daily.     melatonin 5 MG TABS Take 5 mg by mouth at bedtime as needed (sleep).     metoprolol tartrate (LOPRESSOR) 25 MG tablet Take 1 tablet (25 mg total) by mouth daily as needed. 90 tablet 1   Multiple Vitamins-Minerals (PRESERVISION AREDS) TABS Take 1 tablet by mouth 2 (two) times daily.     prednisoLONE acetate (PRED FORTE) 1 % ophthalmic suspension Place 1 drop into the right eye 2 (two) times daily.     sodium chloride (OCEAN) 0.65 % nasal spray Place 1 spray into the nose 2 (two) times daily as needed for congestion. Reported on 01/19/2016     venlafaxine XR (EFFEXOR XR) 75 MG 24 hr capsule Take 1 capsule (75 mg total) by mouth daily with breakfast. 90 capsule 3   No facility-administered medications prior to visit.   Allergies  Allergen Reactions   Codeine Nausea Only and Other (See Comments)   Gabapentin Other (See Comments)    Makes her very sleepy     Objective:   Today's Vitals   05/23/23 0911  BP: 122/76  Pulse: 63  Temp: (!) 97.3 F (36.3 C)  SpO2: 96%  Weight: 159 lb 6.4 oz (72.3 kg)  Height: 5' 5.5" (1.664 m)   Body mass index is 26.12 kg/m.   General: Well developed, well nourished. No acute distress. Psych: Alert and oriented. Normal mood and affect.  Health Maintenance Due  Topic Date Due   COVID-19 Vaccine (9 - 2023-24 season) 11/28/2022       05/23/2023    9:40 AM  MMSE - Mini Mental State Exam  Orientation to time 4  Orientation to Place 5  Registration 3  Attention/ Calculation 4  Recall 2  Language- name 2 objects 2  Language- repeat 1  Language- follow 3 step command 3  Language- read & follow direction 1  Write a sentence 1  Copy design 1  Total score 27      Assessment & Plan:   Problem List Items Addressed This Visit       Other   Mild cognitive impairment - Primary    Ms. Nebel's difficulty with recalling conversations and mild decline in orientation to time and recall are consistent with mild cognitive impairment. We discussed that there is  not consistent evidence for use of supplements or vitamins to prevent the development of dementia. I did encourage her to engage in regular cognitive activities to help improve memory. She would not be indicated to start a medication at this point. I recommend we repeat her MMSE in 6-12 months.       Return in about 6 months (around 11/22/2023) for Reassessment.   Loyola Mast, MD

## 2023-05-29 DIAGNOSIS — Z961 Presence of intraocular lens: Secondary | ICD-10-CM | POA: Diagnosis not present

## 2023-05-29 DIAGNOSIS — Z947 Corneal transplant status: Secondary | ICD-10-CM | POA: Diagnosis not present

## 2023-05-29 DIAGNOSIS — H43813 Vitreous degeneration, bilateral: Secondary | ICD-10-CM | POA: Diagnosis not present

## 2023-05-29 DIAGNOSIS — H0102B Squamous blepharitis left eye, upper and lower eyelids: Secondary | ICD-10-CM | POA: Diagnosis not present

## 2023-05-29 DIAGNOSIS — H16141 Punctate keratitis, right eye: Secondary | ICD-10-CM | POA: Diagnosis not present

## 2023-05-29 DIAGNOSIS — H26492 Other secondary cataract, left eye: Secondary | ICD-10-CM | POA: Diagnosis not present

## 2023-05-29 DIAGNOSIS — H0102A Squamous blepharitis right eye, upper and lower eyelids: Secondary | ICD-10-CM | POA: Diagnosis not present

## 2023-05-29 DIAGNOSIS — H353131 Nonexudative age-related macular degeneration, bilateral, early dry stage: Secondary | ICD-10-CM | POA: Diagnosis not present

## 2023-05-29 DIAGNOSIS — H40023 Open angle with borderline findings, high risk, bilateral: Secondary | ICD-10-CM | POA: Diagnosis not present

## 2023-06-19 ENCOUNTER — Telehealth: Payer: Self-pay | Admitting: Family Medicine

## 2023-06-19 DIAGNOSIS — N951 Menopausal and female climacteric states: Secondary | ICD-10-CM

## 2023-06-19 NOTE — Telephone Encounter (Signed)
Prescription Request  06/19/2023  LOV: 05/23/2023  What is the name of the medication or equipment? Veozah 45mg  /day noon  Have you contacted your pharmacy to request a refill? Yes, I could not find on her list  Which pharmacy would you like this sent to?  [id number] [place], [place] - [address] AT [facility] [address] [place] [address] Phone: [phone] Fax: [phone]     Patient notified that their request is being sent to the clinical staff for review and that they should receive a response within 2 business days.   Please advise at [facility] [phone]

## 2023-06-20 MED ORDER — VEOZAH 45 MG PO TABS
45.0000 mg | ORAL_TABLET | Freq: Every day | ORAL | 3 refills | Status: DC
Start: 2023-06-20 — End: 2023-06-26

## 2023-06-20 NOTE — Addendum Note (Signed)
Addended by: Waymond Cera on: 06/20/2023 04:19 PM   Modules accepted: Orders

## 2023-06-20 NOTE — Telephone Encounter (Signed)
Rx sent to the [facility] as patient requested. Dm/cma

## 2023-06-20 NOTE — Telephone Encounter (Signed)
Lft VM to rtn call to clarify that she is no longer using the mail order for this medication. Dm/cma

## 2023-06-20 NOTE — Telephone Encounter (Signed)
Pt does want to use [facility] to order this med Fezolinetant (VEOZAH) 45 MG TABS [[id number]]   She wanted me to add that she is still having evening hot flashes and has started ion the morning.  Please advise.

## 2023-06-26 ENCOUNTER — Ambulatory Visit (INDEPENDENT_AMBULATORY_CARE_PROVIDER_SITE_OTHER): Payer: Medicare Other | Admitting: Family Medicine

## 2023-06-26 VITALS — BP 116/66 | HR 66 | Temp 98.4°F | Ht 65.5 in | Wt 157.4 lb

## 2023-06-26 DIAGNOSIS — E042 Nontoxic multinodular goiter: Secondary | ICD-10-CM | POA: Diagnosis not present

## 2023-06-26 DIAGNOSIS — R052 Subacute cough: Secondary | ICD-10-CM

## 2023-06-26 DIAGNOSIS — N951 Menopausal and female climacteric states: Secondary | ICD-10-CM | POA: Diagnosis not present

## 2023-06-26 MED ORDER — VEOZAH 45 MG PO TABS
45.0000 mg | ORAL_TABLET | Freq: Every day | ORAL | 2 refills | Status: DC
Start: 2023-06-26 — End: 2023-08-20

## 2023-06-26 NOTE — Assessment & Plan Note (Signed)
I recommend she switch taking the Veozah 45 mg to about an hour before bedtime. I will plan to obtain LFTs at her next visit in Sept.

## 2023-06-26 NOTE — Progress Notes (Signed)
[facility] [address] [place] [address] Dept: [phone] Dept Fax: [phone]  Office Visit  Subjective:    Patient ID: Katherine Powell, female    DOB: 06/28/1932, 89 y.o..   MRN: [id number]  Chief Complaint  Patient presents with   Sinus Problem    C/o having sinus drainage x 2 weeks.  Has been taking Tylenol an Mucinex.    History of Present Illness:  Patient is in today complaining of a chronic cough over the past 2-4 weeks. She had some typical upper airway symptoms initially. Currently, she is experiencing a sensation of drainage. She has been using Mucinex. She has a history of a multinodular goiter. She had thought this might have been involved with causing some of her symptoms.  Katherine Powell has a history of hot flashes. She has a history of breast cancer, so has not been a candidate for estrogen replacement. She has been using venlefaxine, but had seen no improvements in her symptoms. We started her on fezolinetant Katherine Powell). Initially this seemed to work well, but now she is noting she is still awakening feeling hot int he night. She has been taking this at noon each day.  Past Medical History: Patient Active Problem List   Diagnosis Date Noted   Mild cognitive impairment 05/23/2023   Vasomotor symptoms due to menopause 04/17/2023   Mitral regurgitation 03/15/2023   Coronary artery calcification 03/04/2023   Precordial chest pain 02/13/2023   Arthropathy of lumbar facet joint 02/13/2022   Osteoarthritis of both hands 08/16/2021   Long term (current) use of anticoagulants 04/14/2021   Personal history of colonic polyps 04/14/2021   Mixed incontinence 04/14/2021   History of cornea transplant 04/14/2021   Essential hypertension 04/14/2021   Degeneration of lumbar intervertebral disc 04/14/2021   Corneal transplant failure, right eye 04/14/2021   Chronic kidney disease, stage 3a (HCC) 04/14/2021    Anxiety 04/14/2021   Allergic rhinitis 04/14/2021   Presbycusis of both ears 04/14/2021   Red blood cell antibody positive 12/05/2018   Multinodular goiter (nontoxic) 02/11/2017   Spinal stenosis of lumbar region 10/31/2016   Degenerative spondylolisthesis 10/11/2016   Osteopenia 07/18/2016   Lumbar radiculopathy 01/05/2015   Paroxysmal atrial fibrillation (HCC) 12/29/2014   Malignant neoplasm of upper-outer quadrant of right breast in female, estrogen receptor positive (HCC) 11/17/2013   Past Surgical History:  Procedure Laterality Date   ABDOMINAL HYSTERECTOMY  1973   endometriosis   APPENDECTOMY  1954   BACK SURGERY     lumb-2005   BILATERAL OOPHORECTOMY     benign tumor on ovary   BREAST LUMPECTOMY WITH NEEDLE LOCALIZATION Right 11/09/2013   Procedure: BREAST LUMPECTOMY WITH NEEDLE LOCALIZATION;  Surgeon: [name], MD;  Location: [facility];  Service: General;  Laterality: Right;   CATARACT EXTRACTION  2013   both   CYSTOCELE REPAIR  2008   EYE SURGERY     cataracts-plugs for eyes   RECTOCELE REPAIR  2008   TONSILLECTOMY     Family History  Problem Relation Age of Onset   Alzheimer's disease Mother    Stroke Father    Heart attack Neg Hx    Outpatient Medications Prior to Visit  Medication Sig Dispense Refill   acetaminophen (TYLENOL) 650 MG CR tablet Take 650 mg by mouth every 8 (eight) hours as needed for pain. Reported on 01/19/2016     ALPRAZolam (XANAX) 0.5 MG tablet TAKE 1/2 TABLET(0.25 MG) BY MOUTH  TWICE DAILY AS NEEDED FOR ANXIETY 30 tablet 2   apixaban (ELIQUIS) 5 MG TABS tablet Take 1 tablet (5 mg total) by mouth 2 (two) times daily. 180 tablet 1   calcium citrate (CALCITRATE - DOSED IN MG ELEMENTAL CALCIUM) 950 (200 Ca) MG tablet Take 200 mg of elemental calcium by mouth daily.     cycloSPORINE (RESTASIS) 0.05 % ophthalmic emulsion Place 1 drop into both eyes 2 (two) times daily as needed (dry eyes).     diltiazem (CARDIZEM CD) 180 MG  24 hr capsule Take 1 capsule (180 mg total) by mouth daily. 90 capsule 3   diphenhydrAMINE (BENADRYL) 50 MG capsule Take 50 mg by mouth every 6 (six) hours as needed.     fexofenadine (ALLEGRA) 180 MG tablet Take 180 mg by mouth daily as needed (seasonal allergies).     flecainide (TAMBOCOR) 100 MG tablet TAKE 1 TABLET TWICE A DAY 180 tablet 3   Glucosamine HCl-MSM (GLUCOSAMINE-MSM PO) Take 1 tablet by mouth 2 (two) times daily.     melatonin 5 MG TABS Take 5 mg by mouth at bedtime as needed (sleep).     metoprolol tartrate (LOPRESSOR) 25 MG tablet Take 1 tablet (25 mg total) by mouth daily as needed. 90 tablet 1   Multiple Vitamins-Minerals (PRESERVISION AREDS) TABS Take 1 tablet by mouth 2 (two) times daily.     prednisoLONE acetate (PRED FORTE) 1 % ophthalmic suspension Place 1 drop into the right eye 2 (two) times daily.     sodium chloride (OCEAN) 0.65 % nasal spray Place 1 spray into the nose 2 (two) times daily as needed for congestion. Reported on 01/19/2016     venlafaxine XR (EFFEXOR XR) 75 MG 24 hr capsule Take 1 capsule (75 mg total) by mouth daily with breakfast. 90 capsule 3   Fezolinetant (VEOZAH) 45 MG TABS Take 1 tablet (45 mg total) by mouth daily at 12 noon. 90 tablet 3   No facility-administered medications prior to visit.   Allergies  Allergen Reactions   Codeine Nausea Only and Other (See Comments)   Gabapentin Other (See Comments)    Makes her very sleepy     Objective:   Today's Vitals   06/26/23 0858  BP: 116/66  Pulse: 66  Temp: 98.4 F (36.9 C)  TempSrc: Temporal  SpO2: 97%  Weight: 157 lb 6.4 oz (71.4 kg)  Height: 5' 5.5" (1.664 m)   Body mass index is 25.79 kg/m.   General: Well developed, well nourished. No acute distress. HEENT: Normocephalic, non-traumatic. PERRL, EOMI. Conjunctiva clear. External ears normal. EAC and TMs   normal bilaterally. Nose clear without congestion or rhinorrhea. Mucous membranes moist. There is mild mucous   streaking of  the posterior oropharynx with mild cobblestoning. Good dentition. Neck: Supple. No lymphadenopathy. No thyromegaly. Lungs: Clear to auscultation bilaterally. No wheezing, rales or rhonchi. Psych: Alert and oriented. Normal mood and affect.  There are no preventive care reminders to display for this patient.  Imaging: US Thyroid (08/18/2021) IMPRESSION: 1. Previously biopsied nodules in the left and right mid gland demonstrate no significant interval change compared to prior imaging from 2018. Recommend correlation with prior biopsy results. Presuming benign pathology, no further imaging follow-up would be recommended. 2. Diffusely enlarged, and heterogeneous thyroid gland.    Assessment & Plan:   Problem List Items Addressed This Visit       Endocrine   Multinodular goiter (nontoxic)    I do not see any enlargement on physical  exam to suggest this as a cause for her cough. I recommend we monitor this for now.        Other   Vasomotor symptoms due to menopause    I recommend she switch taking the Veozah 45 mg to about an hour before bedtime. I will plan to obtain LFTs at her next visit in Sept.      Relevant Medications   Fezolinetant (VEOZAH) 45 MG TABS   Other Visit Diagnoses     Subacute cough    -  Primary   Appears to be secondary to PND. I recommend she try using nasal saline washes to reduce mucous.       Return for Follow-up as scheduled.   Loyola Mast, MD

## 2023-06-26 NOTE — Assessment & Plan Note (Signed)
I do not see any enlargement on physical exam to suggest this as a cause for her cough. I recommend we monitor this for now.

## 2023-06-27 ENCOUNTER — Encounter: Payer: Self-pay | Admitting: Cardiology

## 2023-06-27 DIAGNOSIS — H353133 Nonexudative age-related macular degeneration, bilateral, advanced atrophic without subfoveal involvement: Secondary | ICD-10-CM | POA: Diagnosis not present

## 2023-06-27 DIAGNOSIS — H35033 Hypertensive retinopathy, bilateral: Secondary | ICD-10-CM | POA: Diagnosis not present

## 2023-06-27 DIAGNOSIS — H1789 Other corneal scars and opacities: Secondary | ICD-10-CM | POA: Diagnosis not present

## 2023-07-18 ENCOUNTER — Encounter (INDEPENDENT_AMBULATORY_CARE_PROVIDER_SITE_OTHER): Payer: Self-pay

## 2023-07-18 DIAGNOSIS — Z6826 Body mass index (BMI) 26.0-26.9, adult: Secondary | ICD-10-CM | POA: Diagnosis not present

## 2023-07-18 DIAGNOSIS — M47816 Spondylosis without myelopathy or radiculopathy, lumbar region: Secondary | ICD-10-CM | POA: Diagnosis not present

## 2023-07-19 ENCOUNTER — Encounter: Payer: Self-pay | Admitting: Nurse Practitioner

## 2023-07-19 ENCOUNTER — Telehealth: Payer: Self-pay | Admitting: Family Medicine

## 2023-07-19 ENCOUNTER — Telehealth: Payer: Medicare Other | Admitting: Nurse Practitioner

## 2023-07-19 DIAGNOSIS — U071 COVID-19: Secondary | ICD-10-CM | POA: Insufficient documentation

## 2023-07-19 MED ORDER — MOLNUPIRAVIR EUA 200MG CAPSULE: 4.0000 | ORAL_CAPSULE | Freq: Two times a day (BID) | ORAL | 0 refills | Status: AC

## 2023-07-19 NOTE — Assessment & Plan Note (Signed)
She is currently taking Eliquis which is contraindicated with Paxlovid.  Will have her start molnupiravir twice a day for 5 days.  Encourage fluids, rest.  She can gargle with warm salt water or take Tylenol as needed for her sore throat.  Reviewed home care instructions for COVID. Advised self-isolation at home for at least 5 days. After 5 days, if improved and fever resolved, can be in public, but should wear a mask around others for an additional 5 days. If symptoms, esp, dyspnea develops/worsens, recommend in-person evaluation at either an urgent care or the emergency room.

## 2023-07-19 NOTE — Patient Instructions (Signed)
It was great to see you!  Start molnupiravir twice a day for 5 days.   Drink plenty of fluids and get rest  Wear a mask for 10 days.   Let's follow-up if your symptoms worsen or don't improve   Take care,  Rodman Pickle, NP

## 2023-07-19 NOTE — Telephone Encounter (Signed)
Pt called and said she has covid and would like some medications

## 2023-07-19 NOTE — Progress Notes (Signed)
[facility]ARE [facility] [address] [place] [address] Dept: [phone] Dept Fax: [phone]  Virtual Video Visit  I connected with Katherine Powell on 07/19/23 at  3:40 PM EDT by a video enabled telemedicine application and verified that I am speaking with the correct person using two identifiers.  Location patient: Home Location provider: Clinic Persons participating in the virtual visit: Patient; Katherine Pickle, NP; Katherine Powell, CMA  I discussed the limitations of evaluation and management by telemedicine and the availability of in person appointments. The patient expressed understanding and agreed to proceed.  Chief Complaint  Patient presents with   Covid Positive    Positive covid-19 test today    SUBJECTIVE:  HPI: Katherine Powell is a 89 y.o. female who presents with sore throat and positive home COVID test.  Symptoms started today.  UPPER RESPIRATORY TRACT INFECTION  Fever: no Cough: no Shortness of breath: no Wheezing: no Chest pain: no Chest tightness: no Chest congestion: no Nasal congestion: no Runny nose: no Post nasal drip: no Sneezing: no Sore throat: yes Swollen glands: no Sinus pressure: no Headache: no Face pain: no Toothache: no Ear pain: no bilateral Ear pressure: no bilateral Eyes red/itching:no Eye drainage/crusting: no  Vomiting: no Rash: no Fatigue: no Sick contacts: yes husband has covid Strep contacts: no  Context: stable Recurrent sinusitis: no Relief with OTC cold/cough medications:  n/a   Treatments attempted: none    Patient Active Problem List   Diagnosis Date Noted   COVID-19 07/19/2023   Mild cognitive impairment 05/23/2023   Vasomotor symptoms due to menopause 04/17/2023   Mitral regurgitation 03/15/2023   Coronary artery calcification 03/04/2023   Precordial chest pain 02/13/2023   Arthropathy of lumbar facet joint 02/13/2022   Osteoarthritis of both  hands 08/16/2021   Long term (current) use of anticoagulants 04/14/2021   Personal history of colonic polyps 04/14/2021   Mixed incontinence 04/14/2021   History of cornea transplant 04/14/2021   Essential hypertension 04/14/2021   Degeneration of lumbar intervertebral disc 04/14/2021   Corneal transplant failure, right eye 04/14/2021   Chronic kidney disease, stage 3a (HCC) 04/14/2021   Anxiety 04/14/2021   Allergic rhinitis 04/14/2021   Presbycusis of both ears 04/14/2021   Red blood cell antibody positive 12/05/2018   Multinodular goiter (nontoxic) 02/11/2017   Spinal stenosis of lumbar region 10/31/2016   Degenerative spondylolisthesis 10/11/2016   Osteopenia 07/18/2016   Lumbar radiculopathy 01/05/2015   Paroxysmal atrial fibrillation (HCC) 12/29/2014   Malignant neoplasm of upper-outer quadrant of right breast in female, estrogen receptor positive (HCC) 11/17/2013    Past Surgical History:  Procedure Laterality Date   ABDOMINAL HYSTERECTOMY  1973   endometriosis   APPENDECTOMY  1954   BACK SURGERY     lumb-2005   BILATERAL OOPHORECTOMY     benign tumor on ovary   BREAST LUMPECTOMY WITH NEEDLE LOCALIZATION Right 11/09/2013   Procedure: BREAST LUMPECTOMY WITH NEEDLE LOCALIZATION;  Surgeon: [name], MD;  Location: [facility];  Service: General;  Laterality: Right;   CATARACT EXTRACTION  2013   both   CYSTOCELE REPAIR  2008   EYE SURGERY     cataracts-plugs for eyes   RECTOCELE REPAIR  2008   TONSILLECTOMY      Family History  Problem Relation Age of Onset   Alzheimer's disease Mother    Stroke Father    Heart attack Neg Hx     Social History  Tobacco Use   Smoking status: Never   Smokeless tobacco: Never  Vaping Use   Vaping status: Never Used  Substance Use Topics   Alcohol use: Yes    Comment: wine once a month   Drug use: No     Current Outpatient Medications:    molnupiravir EUA (LAGEVRIO) 200 mg CAPS capsule, Take 4  capsules (800 mg total) by mouth 2 (two) times daily for 5 days., Disp: 40 capsule, Rfl: 0   acetaminophen (TYLENOL) 650 MG CR tablet, Take 650 mg by mouth every 8 (eight) hours as needed for pain. Reported on 01/19/2016, Disp: , Rfl:    ALPRAZolam (XANAX) 0.5 MG tablet, TAKE 1/2 TABLET(0.25 MG) BY MOUTH TWICE DAILY AS NEEDED FOR ANXIETY, Disp: 30 tablet, Rfl: 2   apixaban (ELIQUIS) 5 MG TABS tablet, Take 1 tablet (5 mg total) by mouth 2 (two) times daily., Disp: 180 tablet, Rfl: 1   calcium citrate (CALCITRATE - DOSED IN MG ELEMENTAL CALCIUM) 950 (200 Ca) MG tablet, Take 200 mg of elemental calcium by mouth daily., Disp: , Rfl:    cycloSPORINE (RESTASIS) 0.05 % ophthalmic emulsion, Place 1 drop into both eyes 2 (two) times daily as needed (dry eyes)., Disp: , Rfl:    diltiazem (CARDIZEM CD) 180 MG 24 hr capsule, Take 1 capsule (180 mg total) by mouth daily., Disp: 90 capsule, Rfl: 3   diphenhydrAMINE (BENADRYL) 50 MG capsule, Take 50 mg by mouth every 6 (six) hours as needed., Disp: , Rfl:    fexofenadine (ALLEGRA) 180 MG tablet, Take 180 mg by mouth daily as needed (seasonal allergies)., Disp: , Rfl:    Fezolinetant (VEOZAH) 45 MG TABS, Take 1 tablet (45 mg total) by mouth at bedtime., Disp: 30 tablet, Rfl: 2   flecainide (TAMBOCOR) 100 MG tablet, TAKE 1 TABLET TWICE A DAY, Disp: 180 tablet, Rfl: 3   Glucosamine HCl-MSM (GLUCOSAMINE-MSM PO), Take 1 tablet by mouth 2 (two) times daily., Disp: , Rfl:    melatonin 5 MG TABS, Take 5 mg by mouth at bedtime as needed (sleep)., Disp: , Rfl:    metoprolol tartrate (LOPRESSOR) 25 MG tablet, Take 1 tablet (25 mg total) by mouth daily as needed., Disp: 90 tablet, Rfl: 1   Multiple Vitamins-Minerals (PRESERVISION AREDS) TABS, Take 1 tablet by mouth 2 (two) times daily., Disp: , Rfl:    prednisoLONE acetate (PRED FORTE) 1 % ophthalmic suspension, Place 1 drop into the right eye 2 (two) times daily., Disp: , Rfl:    sodium chloride (OCEAN) 0.65 % nasal spray,  Place 1 spray into the nose 2 (two) times daily as needed for congestion. Reported on 01/19/2016, Disp: , Rfl:    venlafaxine XR (EFFEXOR XR) 75 MG 24 hr capsule, Take 1 capsule (75 mg total) by mouth daily with breakfast., Disp: 90 capsule, Rfl: 3  Allergies  Allergen Reactions   Codeine Nausea Only and Other (See Comments)   Gabapentin Other (See Comments)    Makes her very sleepy    ROS: See pertinent positives and negatives per HPI.  OBSERVATIONS/OBJECTIVE:  VITALS per patient if applicable: Today's Vitals   There is no height or weight on file to calculate BMI.    GENERAL: Alert and oriented. Appears well and in no acute distress.  HEENT: Atraumatic. Conjunctiva clear. No obvious abnormalities on inspection of external nose and ears.  NECK: Normal movements of the head and neck.  LUNGS: On inspection, no signs of respiratory distress. Breathing rate appears normal. No obvious  gross SOB, gasping or wheezing, and no conversational dyspnea.  CV: No obvious cyanosis.  MS: Moves all visible extremities without noticeable abnormality.  PSYCH/NEURO: Pleasant and cooperative. No obvious depression or anxiety. Speech and thought processing grossly intact.  ASSESSMENT AND PLAN:  Problem List Items Addressed This Visit       Other   COVID-19 - Primary    She is currently taking Eliquis which is contraindicated with Paxlovid.  Will have her start molnupiravir twice a day for 5 days.  Encourage fluids, rest.  She can gargle with warm salt water or take Tylenol as needed for her sore throat.  Reviewed home care instructions for COVID. Advised self-isolation at home for at least 5 days. After 5 days, if improved and fever resolved, can be in public, but should wear a mask around others for an additional 5 days. If symptoms, esp, dyspnea develops/worsens, recommend in-person evaluation at either an urgent care or the emergency room.       Relevant Medications   molnupiravir EUA  (LAGEVRIO) 200 mg CAPS capsule     I discussed the assessment and treatment plan with the patient. The patient was provided an opportunity to ask questions and all were answered. The patient agreed with the plan and demonstrated an understanding of the instructions.   The patient was advised to call back or seek an in-person evaluation if the symptoms worsen or if the condition fails to improve as anticipated.   Gerre Scull, NP

## 2023-07-19 NOTE — Telephone Encounter (Signed)
Tested covid today.  No symptoms.  Recently traveled to [place]. Dm/cma

## 2023-07-26 DIAGNOSIS — H18211 Corneal edema secondary to contact lens, right eye: Secondary | ICD-10-CM | POA: Diagnosis not present

## 2023-08-06 ENCOUNTER — Ambulatory Visit: Payer: Medicare Other | Admitting: Cardiology

## 2023-08-07 DIAGNOSIS — M47816 Spondylosis without myelopathy or radiculopathy, lumbar region: Secondary | ICD-10-CM | POA: Diagnosis not present

## 2023-08-12 ENCOUNTER — Other Ambulatory Visit: Payer: Self-pay

## 2023-08-12 DIAGNOSIS — I480 Paroxysmal atrial fibrillation: Secondary | ICD-10-CM

## 2023-08-12 MED ORDER — APIXABAN 5 MG PO TABS
5.0000 mg | ORAL_TABLET | Freq: Two times a day (BID) | ORAL | 1 refills | Status: DC
Start: 2023-08-12 — End: 2024-02-10

## 2023-08-12 NOTE — Telephone Encounter (Signed)
Prescription refill request for Eliquis received. Indication:afib Last office visit:5/24 Scr:1.05  3/24 Age: 89 Weight:71.4  kg  Prescription refilled

## 2023-08-20 ENCOUNTER — Encounter: Payer: Self-pay | Admitting: Family Medicine

## 2023-08-20 ENCOUNTER — Ambulatory Visit (INDEPENDENT_AMBULATORY_CARE_PROVIDER_SITE_OTHER): Payer: Medicare Other | Admitting: Family Medicine

## 2023-08-20 VITALS — BP 130/66 | HR 67 | Temp 97.9°F | Ht 65.5 in | Wt 159.6 lb

## 2023-08-20 DIAGNOSIS — I480 Paroxysmal atrial fibrillation: Secondary | ICD-10-CM

## 2023-08-20 DIAGNOSIS — I10 Essential (primary) hypertension: Secondary | ICD-10-CM | POA: Diagnosis not present

## 2023-08-20 DIAGNOSIS — N951 Menopausal and female climacteric states: Secondary | ICD-10-CM | POA: Diagnosis not present

## 2023-08-20 DIAGNOSIS — N1831 Chronic kidney disease, stage 3a: Secondary | ICD-10-CM | POA: Diagnosis not present

## 2023-08-20 LAB — HEPATIC FUNCTION PANEL
ALT: 14 U/L (ref 0–35)
AST: 18 U/L (ref 0–37)
Albumin: 3.9 g/dL (ref 3.5–5.2)
Alkaline Phosphatase: 83 U/L (ref 39–117)
Bilirubin, Direct: 0.1 mg/dL (ref 0.0–0.3)
Total Bilirubin: 0.4 mg/dL (ref 0.2–1.2)
Total Protein: 6.1 g/dL (ref 6.0–8.3)

## 2023-08-20 MED ORDER — VEOZAH 45 MG PO TABS
45.0000 mg | ORAL_TABLET | Freq: Every day | ORAL | 2 refills | Status: DC
Start: 2023-08-20 — End: 2023-11-08

## 2023-08-20 NOTE — Assessment & Plan Note (Signed)
Stable. BP is in good control.

## 2023-08-20 NOTE — Assessment & Plan Note (Signed)
Blood pressure is in good control. Continue diltiazem CD 180 mg daily and metoprolol tartrate 25 mg twice daily.

## 2023-08-20 NOTE — Assessment & Plan Note (Signed)
Continue Veozah 45 mg an hour before bedtime as needed. I will plan to obtain LFTs today for monitoring of her medication.

## 2023-08-20 NOTE — Assessment & Plan Note (Signed)
Stable. Rate well controlled. Continue metoprolol tartrate 25 mg bid and flecainide 100 mg bid. Continue Eliquis 5 mg bid for anticoagulation.

## 2023-08-20 NOTE — Progress Notes (Signed)
[facility] [address] [place] [address] Dept: [phone] Dept Fax: [phone]  Chronic Care Office Visit  Subjective:    Patient ID: Katherine Powell, female    DOB: 21-Oct-1932, 89 y.o..   MRN: [id number]  Chief Complaint  Patient presents with   Hypertension    4 month f/u HTN.    History of Present Illness:  Patient is in today for reassessment of chronic medical issues.  Ms. Maiorino has a history of hypertension. She is managed on diltiazem CD 180 mg daily. She also has some moderate CKD.    Ms. Ottum has a history of atrial fibrillation. She is managed on metoprolol tartrate 25 mg bid for rate control and flecainide 100 mg bid for rhythm control. She is on Eliquis 5 mg bid for anticoagulation.   Ms. Marceaux has a history of anxiety. She was previously taking only Xanax about once a week for this. She is now managed on venlafaxine XR 75 mg daily.   Ms. Onofre has a history of hot flashes. She has a history of breast cancer, so has not been a candidate for estrogen replacement. She is now on her on fezolinetant (Veozah) 45 mg daily. She has switched to taking this as needed (not every day) and feels pleased with her response.  Past Medical History: Patient Active Problem List   Diagnosis Date Noted   COVID-19 07/19/2023   Mild cognitive impairment 05/23/2023   Vasomotor symptoms due to menopause 04/17/2023   Mitral regurgitation 03/15/2023   Coronary artery calcification 03/04/2023   Precordial chest pain 02/13/2023   Arthropathy of lumbar facet joint 02/13/2022   Osteoarthritis of both hands 08/16/2021   Long term (current) use of anticoagulants 04/14/2021   Personal history of colonic polyps 04/14/2021   Mixed incontinence 04/14/2021   History of cornea transplant 04/14/2021   Essential hypertension 04/14/2021   Degeneration of lumbar intervertebral disc 04/14/2021   Corneal transplant failure,  right eye 04/14/2021   Chronic kidney disease, stage 3a (HCC) 04/14/2021   Anxiety 04/14/2021   Allergic rhinitis 04/14/2021   Presbycusis of both ears 04/14/2021   Red blood cell antibody positive 12/05/2018   Multinodular goiter (nontoxic) 02/11/2017   Spinal stenosis of lumbar region 10/31/2016   Degenerative spondylolisthesis 10/11/2016   Osteopenia 07/18/2016   Lumbar radiculopathy 01/05/2015   Paroxysmal atrial fibrillation (HCC) 12/29/2014   Malignant neoplasm of upper-outer quadrant of right breast in female, estrogen receptor positive (HCC) 11/17/2013   Past Surgical History:  Procedure Laterality Date   ABDOMINAL HYSTERECTOMY  1973   endometriosis   APPENDECTOMY  1954   BACK SURGERY     lumb-2005   BILATERAL OOPHORECTOMY     benign tumor on ovary   BREAST LUMPECTOMY WITH NEEDLE LOCALIZATION Right 11/09/2013   Procedure: BREAST LUMPECTOMY WITH NEEDLE LOCALIZATION;  Surgeon: [name], MD;  Location: [facility];  Service: General;  Laterality: Right;   CATARACT EXTRACTION  2013   both   CYSTOCELE REPAIR  2008   EYE SURGERY     cataracts-plugs for eyes   RECTOCELE REPAIR  2008   TONSILLECTOMY     Family History  Problem Relation Age of Onset   Alzheimer's disease Mother    Stroke Father    Heart attack Neg Hx    Outpatient Medications Prior to Visit  Medication Sig Dispense Refill   acetaminophen (TYLENOL) 650 MG CR tablet Take 650 mg by mouth every 8 (eight)  hours as needed for pain. Reported on 01/19/2016     ALPRAZolam (XANAX) 0.5 MG tablet TAKE 1/2 TABLET(0.25 MG) BY MOUTH TWICE DAILY AS NEEDED FOR ANXIETY 30 tablet 2   apixaban (ELIQUIS) 5 MG TABS tablet Take 1 tablet (5 mg total) by mouth 2 (two) times daily. 180 tablet 1   calcium citrate (CALCITRATE - DOSED IN MG ELEMENTAL CALCIUM) 950 (200 Ca) MG tablet Take 200 mg of elemental calcium by mouth daily.     cycloSPORINE (RESTASIS) 0.05 % ophthalmic emulsion Place 1 drop into both eyes 2  (two) times daily as needed (dry eyes).     diltiazem (CARDIZEM CD) 180 MG 24 hr capsule Take 1 capsule (180 mg total) by mouth daily. 90 capsule 3   diphenhydrAMINE (BENADRYL) 50 MG capsule Take 50 mg by mouth every 6 (six) hours as needed.     fexofenadine (ALLEGRA) 180 MG tablet Take 180 mg by mouth daily as needed (seasonal allergies).     flecainide (TAMBOCOR) 100 MG tablet TAKE 1 TABLET TWICE A DAY 180 tablet 3   Glucosamine HCl-MSM (GLUCOSAMINE-MSM PO) Take 1 tablet by mouth 2 (two) times daily.     melatonin 5 MG TABS Take 5 mg by mouth at bedtime as needed (sleep).     metoprolol tartrate (LOPRESSOR) 25 MG tablet Take 1 tablet (25 mg total) by mouth daily as needed. 90 tablet 1   Multiple Vitamins-Minerals (PRESERVISION AREDS) TABS Take 1 tablet by mouth 2 (two) times daily.     sodium chloride (OCEAN) 0.65 % nasal spray Place 1 spray into the nose 2 (two) times daily as needed for congestion. Reported on 01/19/2016     venlafaxine XR (EFFEXOR XR) 75 MG 24 hr capsule Take 1 capsule (75 mg total) by mouth daily with breakfast. 90 capsule 3   Fezolinetant (VEOZAH) 45 MG TABS Take 1 tablet (45 mg total) by mouth at bedtime. 30 tablet 2   prednisoLONE acetate (PRED FORTE) 1 % ophthalmic suspension Place 1 drop into the right eye 2 (two) times daily. (Patient not taking: Reported on 08/20/2023)     No facility-administered medications prior to visit.   Allergies  Allergen Reactions   Codeine Nausea Only and Other (See Comments)   Gabapentin Other (See Comments)    Makes her very sleepy   Objective:   Today's Vitals   08/20/23 1036  BP: 130/66  Pulse: 67  Temp: 97.9 F (36.6 C)  TempSrc: Temporal  SpO2: 96%  Weight: 159 lb 9.6 oz (72.4 kg)  Height: 5' 5.5" (1.664 m)   Body mass index is 26.15 kg/m.   General: Well developed, well nourished. No acute distress. Psych: Alert and oriented. Normal mood and affect.  There are no preventive care reminders to display for this  patient.    Assessment & Plan:   Problem List Items Addressed This Visit       Cardiovascular and Mediastinum   Essential hypertension - Primary    Blood pressure is in good control. Continue diltiazem CD 180 mg daily and metoprolol tartrate 25 mg twice daily.       Paroxysmal atrial fibrillation (HCC)    Stable. Rate well controlled. Continue metoprolol tartrate 25 mg bid and flecainide 100 mg bid. Continue Eliquis 5 mg bid for anticoagulation.        Genitourinary   Chronic kidney disease, stage 3a (HCC)    Stable. BP is in good control.        Other  Vasomotor symptoms due to menopause    Continue Veozah 45 mg an hour before bedtime as needed. I will plan to obtain LFTs today for monitoring of her medication.      Relevant Medications   Fezolinetant (VEOZAH) 45 MG TABS   Other Relevant Orders   Hepatic function panel    Return in about 3 months (around 11/19/2023) for Reassessment.   Loyola Mast, MD

## 2023-09-06 DIAGNOSIS — H02052 Trichiasis without entropian right lower eyelid: Secondary | ICD-10-CM | POA: Diagnosis not present

## 2023-09-06 DIAGNOSIS — H18211 Corneal edema secondary to contact lens, right eye: Secondary | ICD-10-CM | POA: Diagnosis not present

## 2023-10-15 DIAGNOSIS — H25811 Combined forms of age-related cataract, right eye: Secondary | ICD-10-CM | POA: Diagnosis not present

## 2023-10-15 DIAGNOSIS — I10 Essential (primary) hypertension: Secondary | ICD-10-CM | POA: Diagnosis not present

## 2023-10-15 DIAGNOSIS — H18221 Idiopathic corneal edema, right eye: Secondary | ICD-10-CM | POA: Diagnosis not present

## 2023-10-16 ENCOUNTER — Ambulatory Visit: Payer: Medicare Other | Attending: Cardiology | Admitting: Physician Assistant

## 2023-10-16 NOTE — Progress Notes (Deleted)
Cardiology Office Note:    Date:  10/16/2023  ID:  NECHUMA FAUBION, DOB 1932-09-02, MRN [id number] PCP: Loyola Mast, MD  [facility] Providers Cardiologist:  Meriam Sprague, MD (Inactive) { Click to update primary MD,subspecialty MD or APP then REFRESH:1}    {Click to Open Review  :1}   Patient Profile:      Paroxysmal atrial fibrillation Flecainide Rx TTE 05/20/2018: Mild focal basal septal hypertrophy, EF 60-65, no RWMA, GR 2 DD, mild AI, mild LAE Myoview 07/08/2020: EF 74, no ischemia or infarction, normal Coronary artery Ca2+ CCTA 03/04/23: CAC score 27.2; total plaque volume is mild (8th percentile); min plaque LAD (<25)  Mitral regurgitation, Aortic Insufficiency  TTE 03/15/2023: EF 55-60, no RWMA, GLS -20.8, normal RVSF, normal PASP, RVSP 35.5, mild MR, mild AI, AV sclerosis, aortic root 4 cm, ascending aorta 3.7 cm (normal range), RAP 8  Hypertension Breast CA ?Hx of TIA       {      :1}   History of Present Illness:  Discussed the use of AI scribe software for clinical note transcription with the patient, who gave verbal consent to proceed.  Katherine Powell is a 89 y.o. female who returns for follow-up of atrial fibrillation, coronary artery calcification.  She was last seen in March 2024.  She had had recent episodes of atrial fibrillation with associated chest pain.  CCTA demonstrated calcium score of 27 with minimal plaque and mild total plaque volume.  Echocardiogram demonstrated normal EF, mild MR, mild AI.  She was to establish with Dr. Shari Prows given Dr. Michaelle Copas retirement.  However, Dr. Shari Prows has since left the practice.          ROS   See HPI ***    Studies Reviewed:       *** Results          Risk Assessment/Calculations:   {Does this patient have ATRIAL FIBRILLATION?:[phone]} No BP recorded.  {Refresh Note OR Click here to enter BP  :1}***       Physical Exam:   VS:  There were no vitals taken for this visit.   Wt Readings  from Last 3 Encounters:  08/20/23 159 lb 9.6 oz (72.4 kg)  06/26/23 157 lb 6.4 oz (71.4 kg)  05/23/23 159 lb 6.4 oz (72.3 kg)    Physical Exam***     Assessment and Plan:   Assessment & Plan Paroxysmal atrial fibrillation (HCC)  Coronary artery calcification  Nonrheumatic mitral valve regurgitation   Assessment and Plan             {  Precordial chest pain She notes a recent episode of atrial fibrillation with rapid rate.  During this, she had chest discomfort.  She notes that this was unusual for her.  She has also noted fatigue which is unusual as well as shortness of breath with activity.  She is 89 years old and remains on flecainide.  As class Ic agents are contraindicated in patients with obstructive coronary artery disease, I have recommended proceeding with coronary CTA to rule out significant CAD.  I have also recommended proceeding with an echocardiogram to rule out structural heart disease. Arrange CCTA with FFR Arrange echocardiogram Follow-up in September as planned with Dr. Shari Prows or sooner if above testing abnormal   Paroxysmal atrial fibrillation As noted, she had a recurrent episode of atrial fibrillation with rapid ventricular rate recently.  She did take an extra dose of flecainide with conversion to  sinus rhythm.  I have advised her to not take an extra dose of flecainide in the future.  Continue flecainide 100 mg twice daily, Cardizem CD 180 mg daily.  Her weight is above 60 kg and creatinine has remained less than 1.5.  Continue Eliquis 5 mg twice daily.  Obtain follow-up CBC, TSH today.  Prescription given for metoprolol tartrate 25 mg daily as needed for recurrent atrial fibrillation.  Consider EP referral if she continues to have recurrent episodes of atrial fibrillation or if we need to take her off of flecainide.   Essential hypertension The patient's blood pressure is controlled on her current regimen.  Continue current therapy.     :1}    {Are you  ordering a CV Procedure (e.g. stress test, cath, DCCV, TEE, etc)?   Press F2        :[id number]}  Dispo:  No follow-ups on file.  Signed, Tereso Newcomer, PA-C

## 2023-10-21 ENCOUNTER — Telehealth: Payer: Self-pay

## 2023-10-21 NOTE — Telephone Encounter (Signed)
PA for Veozah 45 mg submitted through cover my meds and information sent to [facility] to fill for patient.  Dm/cma  Key: [id number]

## 2023-11-01 ENCOUNTER — Other Ambulatory Visit (HOSPITAL_COMMUNITY): Payer: Self-pay

## 2023-11-01 ENCOUNTER — Encounter: Payer: Self-pay | Admitting: Oncology

## 2023-11-04 ENCOUNTER — Ambulatory Visit (INDEPENDENT_AMBULATORY_CARE_PROVIDER_SITE_OTHER): Payer: Medicare Other | Admitting: Family Medicine

## 2023-11-04 VITALS — BP 120/72 | HR 76 | Temp 97.9°F | Ht 65.5 in | Wt 159.8 lb

## 2023-11-04 DIAGNOSIS — I480 Paroxysmal atrial fibrillation: Secondary | ICD-10-CM

## 2023-11-04 DIAGNOSIS — N951 Menopausal and female climacteric states: Secondary | ICD-10-CM | POA: Diagnosis not present

## 2023-11-04 DIAGNOSIS — G3184 Mild cognitive impairment, so stated: Secondary | ICD-10-CM | POA: Diagnosis not present

## 2023-11-04 DIAGNOSIS — N1831 Chronic kidney disease, stage 3a: Secondary | ICD-10-CM

## 2023-11-04 DIAGNOSIS — I10 Essential (primary) hypertension: Secondary | ICD-10-CM | POA: Diagnosis not present

## 2023-11-04 DIAGNOSIS — Z947 Corneal transplant status: Secondary | ICD-10-CM

## 2023-11-04 DIAGNOSIS — F419 Anxiety disorder, unspecified: Secondary | ICD-10-CM

## 2023-11-04 LAB — HEPATIC FUNCTION PANEL
ALT: 12 U/L (ref 0–35)
AST: 18 U/L (ref 0–37)
Albumin: 4.3 g/dL (ref 3.5–5.2)
Alkaline Phosphatase: 82 U/L (ref 39–117)
Bilirubin, Direct: 0.1 mg/dL (ref 0.0–0.3)
Total Bilirubin: 0.5 mg/dL (ref 0.2–1.2)
Total Protein: 6.8 g/dL (ref 6.0–8.3)

## 2023-11-04 NOTE — Progress Notes (Signed)
[facility] [address] [place] [address] Dept: [phone] Dept Fax: [phone]  Chronic Care Office Visit  Subjective:    Patient ID: Katherine Powell, female    DOB: 04-17-1932, 89 y.o..   MRN: [id number]  Chief Complaint  Patient presents with   Hypertension    3 month f/u.  No concerns.    History of Present Illness:  Patient is in today for reassessment of chronic medical issues.  Since her last visit, Ms. Heine had a repeat right corneal transplant. She is using drops and notes her eye doctor feels this is healing well.   Ms. Hames has a history of hypertension. She is managed on diltiazem CD 180 mg daily. She also has moderate CKD.    Ms. Strike has a history of atrial fibrillation. She is managed on metoprolol tartrate 25 mg bid for rate control and flecainide 100 mg bid for rhythm control. She is on Eliquis 5 mg bid for anticoagulation.   Ms. Krumwiede has a history of anxiety. She was previously taking only Xanax about once a week for this. She is now managed on venlafaxine XR 75 mg daily.    Ms. Ahumada has a history of hot flashes. She has a history of breast cancer, so has not been a candidate for estrogen replacement. She is now on her on fezolinetant (Veozah) 45 mg daily. She feels pleased with her response.  Ms. Eppolito has a history of mild cognitive impairment, primarily with some memory impairment. She notes her husband has concerns for her having progressive issues, but that she could not get him to tell her something specific from recent times which concerns him. She does recount having written a check where she put the wrong amount on it. She had to rewrite the check. She felt embarrassed by this, but does nto feel it created any harm.  Past Medical History: Patient Active Problem List   Diagnosis Date Noted   Mild cognitive impairment 05/23/2023   Vasomotor symptoms due to menopause  04/17/2023   Mitral regurgitation 03/15/2023   Coronary artery calcification 03/04/2023   Precordial chest pain 02/13/2023   Arthropathy of lumbar facet joint 02/13/2022   Osteoarthritis of both hands 08/16/2021   Long term (current) use of anticoagulants 04/14/2021   History of colonic polyps 04/14/2021   Mixed incontinence 04/14/2021   History of cornea transplant 04/14/2021   Essential hypertension 04/14/2021   Degeneration of lumbar intervertebral disc 04/14/2021   Corneal transplant failure, right eye 04/14/2021   Chronic kidney disease, stage 3a (HCC) 04/14/2021   Anxiety 04/14/2021   Allergic rhinitis 04/14/2021   Presbycusis of both ears 04/14/2021   Red blood cell antibody positive 12/05/2018   Multinodular goiter (nontoxic) 02/11/2017   Spinal stenosis of lumbar region 10/31/2016   Degenerative spondylolisthesis 10/11/2016   Osteopenia 07/18/2016   Lumbar radiculopathy 01/05/2015   Paroxysmal atrial fibrillation (HCC) 12/29/2014   Malignant neoplasm of upper-outer quadrant of right breast in female, estrogen receptor positive (HCC) 11/17/2013   Past Surgical History:  Procedure Laterality Date   ABDOMINAL HYSTERECTOMY  1973   endometriosis   APPENDECTOMY  1954   BACK SURGERY     lumb-2005   BILATERAL OOPHORECTOMY     benign tumor on ovary   BREAST LUMPECTOMY WITH NEEDLE LOCALIZATION Right 11/09/2013   Procedure: BREAST LUMPECTOMY WITH NEEDLE LOCALIZATION;  Surgeon: [name], MD;  Location: [facility];  Service: General;  Laterality: Right;  CATARACT EXTRACTION  2013   both   CYSTOCELE REPAIR  2008   EYE SURGERY     cataracts-plugs for eyes   RECTOCELE REPAIR  2008   TONSILLECTOMY     Family History  Problem Relation Age of Onset   Alzheimer's disease Mother    Stroke Father    Heart attack Neg Hx    Outpatient Medications Prior to Visit  Medication Sig Dispense Refill   acetaminophen (TYLENOL) 650 MG CR tablet Take 650 mg by  mouth every 8 (eight) hours as needed for pain. Reported on 01/19/2016     ALPRAZolam (XANAX) 0.5 MG tablet TAKE 1/2 TABLET(0.25 MG) BY MOUTH TWICE DAILY AS NEEDED FOR ANXIETY 30 tablet 2   apixaban (ELIQUIS) 5 MG TABS tablet Take 1 tablet (5 mg total) by mouth 2 (two) times daily. 180 tablet 1   calcium citrate (CALCITRATE - DOSED IN MG ELEMENTAL CALCIUM) 950 (200 Ca) MG tablet Take 200 mg of elemental calcium by mouth daily.     cycloSPORINE (RESTASIS) 0.05 % ophthalmic emulsion Place 1 drop into both eyes 2 (two) times daily as needed (dry eyes).     diltiazem (CARDIZEM CD) 180 MG 24 hr capsule Take 1 capsule (180 mg total) by mouth daily. 90 capsule 3   diphenhydrAMINE (BENADRYL) 50 MG capsule Take 50 mg by mouth every 6 (six) hours as needed.     fexofenadine (ALLEGRA) 180 MG tablet Take 180 mg by mouth daily as needed (seasonal allergies).     Fezolinetant (VEOZAH) 45 MG TABS Take 1 tablet (45 mg total) by mouth at bedtime. 30 tablet 2   flecainide (TAMBOCOR) 100 MG tablet TAKE 1 TABLET TWICE A DAY 180 tablet 3   Glucosamine HCl-MSM (GLUCOSAMINE-MSM PO) Take 1 tablet by mouth 2 (two) times daily.     melatonin 5 MG TABS Take 5 mg by mouth at bedtime as needed (sleep).     metoprolol tartrate (LOPRESSOR) 25 MG tablet Take 1 tablet (25 mg total) by mouth daily as needed. 90 tablet 1   Multiple Vitamins-Minerals (PRESERVISION AREDS) TABS Take 1 tablet by mouth 2 (two) times daily.     ofloxacin (OCUFLOX) 0.3 % ophthalmic solution Place into the right eye.     prednisoLONE acetate (PRED FORTE) 1 % ophthalmic suspension SHAKE LIQUID AND INSTILL 1 DROP IN RIGHT EYE 6 TIMES DAILY     sodium chloride (OCEAN) 0.65 % nasal spray Place 1 spray into the nose 2 (two) times daily as needed for congestion. Reported on 01/19/2016     venlafaxine XR (EFFEXOR XR) 75 MG 24 hr capsule Take 1 capsule (75 mg total) by mouth daily with breakfast. 90 capsule 3   No facility-administered medications prior to visit.    Allergies  Allergen Reactions   Codeine Nausea Only and Other (See Comments)   Gabapentin Other (See Comments)    Makes her very sleepy   Objective:   Today's Vitals   11/04/23 1047  BP: 120/72  Pulse: 76  Temp: 97.9 F (36.6 C)  TempSrc: Temporal  SpO2: 97%  Weight: 159 lb 12.8 oz (72.5 kg)  Height: 5' 5.5" (1.664 m)   Body mass index is 26.19 kg/m.   General: Well developed, well nourished. No acute distress. Psych: Alert and oriented. Normal mood and affect. Does have some issues with recall for things, like her children's   occupations, or where her step-children live.  There are no preventive care reminders to display for this patient.  Assessment & Plan:   Problem List Items Addressed This Visit       Cardiovascular and Mediastinum   Paroxysmal atrial fibrillation (HCC) (Chronic)    Stable. Rate well controlled. Continue metoprolol tartrate 25 mg bid and flecainide 100 mg bid. Continue Eliquis 5 mg bid for anticoagulation.      Essential hypertension - Primary    Blood pressure is in good control. Continue diltiazem CD 180 mg daily and metoprolol tartrate 25 mg twice daily.         Genitourinary   Chronic kidney disease, stage 3a (HCC)    Stable. Continue focus on blood pressure control, adequate hydration, and avoidance of nephrotoxic medications.         Other   Anxiety    Continue venlafaxine 75 mg daily and judicious use of alprazolam.      History of cornea transplant    Recent repeat surgery appears to be healing well. Continue to follow with the ophthalmologist.      Mild cognitive impairment    Appears stable at this point. I recommend we repeat her MMSE at her next visit.      Vasomotor symptoms due to menopause    Continue Veozah 45 mg an hour before bedtime as needed. I will plan to obtain LFTs today for monitoring of her medication.      Relevant Orders   Hepatic function panel    Return in about 3 months (around  02/02/2024) for Reassessment.   Loyola Mast, MD

## 2023-11-04 NOTE — Assessment & Plan Note (Signed)
Continue venlafaxine 75 mg daily and judicious use of alprazolam.

## 2023-11-04 NOTE — Assessment & Plan Note (Signed)
Stable. Rate well controlled. Continue metoprolol tartrate 25 mg bid and flecainide 100 mg bid. Continue Eliquis 5 mg bid for anticoagulation.

## 2023-11-04 NOTE — Assessment & Plan Note (Signed)
Appears stable at this point. I recommend we repeat her MMSE at her next visit.

## 2023-11-04 NOTE — Assessment & Plan Note (Signed)
Recent repeat surgery appears to be healing well. Continue to follow with the ophthalmologist.

## 2023-11-04 NOTE — Assessment & Plan Note (Signed)
Blood pressure is in good control. Continue diltiazem CD 180 mg daily and metoprolol tartrate 25 mg twice daily.

## 2023-11-04 NOTE — Assessment & Plan Note (Signed)
Stable. Continue focus on blood pressure control, adequate hydration, and avoidance of nephrotoxic medications.

## 2023-11-04 NOTE — Assessment & Plan Note (Signed)
Continue Veozah 45 mg an hour before bedtime as needed. I will plan to obtain LFTs today for monitoring of her medication.

## 2023-11-07 DIAGNOSIS — H1789 Other corneal scars and opacities: Secondary | ICD-10-CM | POA: Diagnosis not present

## 2023-11-07 DIAGNOSIS — H353133 Nonexudative age-related macular degeneration, bilateral, advanced atrophic without subfoveal involvement: Secondary | ICD-10-CM | POA: Diagnosis not present

## 2023-11-07 DIAGNOSIS — H35033 Hypertensive retinopathy, bilateral: Secondary | ICD-10-CM | POA: Diagnosis not present

## 2023-11-08 ENCOUNTER — Telehealth: Payer: Self-pay | Admitting: Family Medicine

## 2023-11-08 ENCOUNTER — Other Ambulatory Visit: Payer: Self-pay | Admitting: Family Medicine

## 2023-11-08 DIAGNOSIS — N951 Menopausal and female climacteric states: Secondary | ICD-10-CM

## 2023-11-08 NOTE — Telephone Encounter (Signed)
error 

## 2023-11-11 ENCOUNTER — Encounter: Payer: Self-pay | Admitting: Internal Medicine

## 2023-11-11 ENCOUNTER — Ambulatory Visit (HOSPITAL_BASED_OUTPATIENT_CLINIC_OR_DEPARTMENT_OTHER)
Admission: RE | Admit: 2023-11-11 | Discharge: 2023-11-11 | Disposition: A | Discharge status: Home / Self Care | Payer: Medicare Other | Source: Ambulatory Visit | Attending: Internal Medicine | Admitting: Internal Medicine

## 2023-11-11 ENCOUNTER — Ambulatory Visit (INDEPENDENT_AMBULATORY_CARE_PROVIDER_SITE_OTHER): Payer: Medicare Other | Admitting: Internal Medicine

## 2023-11-11 VITALS — BP 114/68 | HR 67 | Temp 98.0°F | Ht 65.5 in | Wt 162.4 lb

## 2023-11-11 DIAGNOSIS — M79622 Pain in left upper arm: Secondary | ICD-10-CM

## 2023-11-11 DIAGNOSIS — R0781 Pleurodynia: Secondary | ICD-10-CM

## 2023-11-11 DIAGNOSIS — W109XXA Fall (on) (from) unspecified stairs and steps, initial encounter: Secondary | ICD-10-CM

## 2023-11-11 DIAGNOSIS — M25512 Pain in left shoulder: Secondary | ICD-10-CM

## 2023-11-11 DIAGNOSIS — R918 Other nonspecific abnormal finding of lung field: Secondary | ICD-10-CM | POA: Diagnosis not present

## 2023-11-11 DIAGNOSIS — S0990XA Unspecified injury of head, initial encounter: Secondary | ICD-10-CM | POA: Diagnosis not present

## 2023-11-11 DIAGNOSIS — R0789 Other chest pain: Secondary | ICD-10-CM

## 2023-11-11 DIAGNOSIS — M19012 Primary osteoarthritis, left shoulder: Secondary | ICD-10-CM | POA: Insufficient documentation

## 2023-11-11 DIAGNOSIS — Z043 Encounter for examination and observation following other accident: Secondary | ICD-10-CM | POA: Diagnosis not present

## 2023-11-11 NOTE — Patient Instructions (Addendum)
Go for xray now   Tylenol for pain  Alternate ice and heat packs  Rest, no heavy lifting

## 2023-11-11 NOTE — Progress Notes (Signed)
[facility] [address] [place] [address] Dept: [phone] Dept Fax: [phone]  Acute Care Office Visit  Subjective:   Katherine Powell December 07, 1931 11/11/2023  Chief Complaint  Patient presents with   Fall    Left side, sore    HPI: Discussed the use of AI scribe software for clinical note transcription with the patient, who gave verbal consent to proceed.  History of Present Illness   The patient presents after a fall from a two-step stool onto a hardwood surface that occurred one day ago. She states she was stepping onto a step stool with her right foot, then went to place her left foot on the step stool as well, when she missed the step and fell.  She landed on her left side, hitting her head and left rib cage on the stool. She denies loss of consciousness and confusion. She is on Xarelto.  Since the fall, she has experienced left-sided rib cage pain, and pain to left upper arm and shoulder. The pain is severe enough to prevent her from wearing a bra. She has taken Tylenol, which provided some relief.       The following portions of the patient's history were reviewed and updated as appropriate: past medical history, past surgical history, family history, social history, allergies, medications, and problem list.   Patient Active Problem List   Diagnosis Date Noted   Mild cognitive impairment 05/23/2023   Vasomotor symptoms due to menopause 04/17/2023   Mitral regurgitation 03/15/2023   Coronary artery calcification 03/04/2023   Precordial chest pain 02/13/2023   Arthropathy of lumbar facet joint 02/13/2022   Osteoarthritis of both hands 08/16/2021   Long term (current) use of anticoagulants 04/14/2021   History of colonic polyps 04/14/2021   Mixed incontinence 04/14/2021   History of cornea transplant 04/14/2021   Essential hypertension 04/14/2021   Degeneration of lumbar intervertebral disc 04/14/2021    Corneal transplant failure, right eye 04/14/2021   Chronic kidney disease, stage 3a (HCC) 04/14/2021   Anxiety 04/14/2021   Allergic rhinitis 04/14/2021   Presbycusis of both ears 04/14/2021   Red blood cell antibody positive 12/05/2018   Multinodular goiter (nontoxic) 02/11/2017   Spinal stenosis of lumbar region 10/31/2016   Degenerative spondylolisthesis 10/11/2016   Osteopenia 07/18/2016   Lumbar radiculopathy 01/05/2015   Paroxysmal atrial fibrillation (HCC) 12/29/2014   Malignant neoplasm of upper-outer quadrant of right breast in female, estrogen receptor positive (HCC) 11/17/2013   Past Medical History:  Diagnosis Date   Acne rosacea 04/14/2021   Allergy    Anxiety    Arrhythmia    atrial fibrillation/  on    Arthritis    Atrial fibrillation (HCC)    Breast cancer (HCC) 11/09/2013   right upper outer   Contact lens/glasses fitting    contact rt eye   Coronary artery calcification 03/04/2023   CCTA 03/04/23: CAC score 27.2; total plaque volume is mild (8th percentile); min plaque LAD (<25)   Hx of radiation therapy 12/15/13- 01/11/14   right breast 4250 cGy in 17 sessions, (hypo-fractionated), no boost   Hypertension    Mitral regurgitation 03/15/2023   TTE 03/15/2023: EF 55-60, no RWMA, GLS -20.8, normal RVSF, normal PASP, RVSP 35.5, mild MR, mild AI, AV sclerosis, aortic root 4 cm, ascending aorta 3.7 cm (normal range), RAP 8   Postmenopausal    Rosacea    Seasonal allergies    Sleep disturbance    Thyroid disease  Urinary incontinence    Past Surgical History:  Procedure Laterality Date   ABDOMINAL HYSTERECTOMY  1973   endometriosis   APPENDECTOMY  1954   BACK SURGERY     lumb-2005   BILATERAL OOPHORECTOMY     benign tumor on ovary   BREAST LUMPECTOMY WITH NEEDLE LOCALIZATION Right 11/09/2013   Procedure: BREAST LUMPECTOMY WITH NEEDLE LOCALIZATION;  Surgeon: [name], MD;  Location: [facility];  Service: General;  Laterality: Right;    CATARACT EXTRACTION  2013   both   CYSTOCELE REPAIR  2008   EYE SURGERY     cataracts-plugs for eyes   RECTOCELE REPAIR  2008   TONSILLECTOMY     Family History  Problem Relation Age of Onset   Alzheimer's disease Mother    Stroke Father    Heart attack Neg Hx     Current Outpatient Medications:    acetaminophen (TYLENOL) 650 MG CR tablet, Take 650 mg by mouth every 8 (eight) hours as needed for pain. Reported on 01/19/2016, Disp: , Rfl:    ALPRAZolam (XANAX) 0.5 MG tablet, TAKE 1/2 TABLET(0.25 MG) BY MOUTH TWICE DAILY AS NEEDED FOR ANXIETY, Disp: 30 tablet, Rfl: 2   apixaban (ELIQUIS) 5 MG TABS tablet, Take 1 tablet (5 mg total) by mouth 2 (two) times daily., Disp: 180 tablet, Rfl: 1   calcium citrate (CALCITRATE - DOSED IN MG ELEMENTAL CALCIUM) 950 (200 Ca) MG tablet, Take 200 mg of elemental calcium by mouth daily., Disp: , Rfl:    diltiazem (CARDIZEM CD) 180 MG 24 hr capsule, Take 1 capsule (180 mg total) by mouth daily., Disp: 90 capsule, Rfl: 3   diphenhydrAMINE (BENADRYL) 50 MG capsule, Take 50 mg by mouth every 6 (six) hours as needed., Disp: , Rfl:    fexofenadine (ALLEGRA) 180 MG tablet, Take 180 mg by mouth daily as needed (seasonal allergies)., Disp: , Rfl:    flecainide (TAMBOCOR) 100 MG tablet, TAKE 1 TABLET TWICE A DAY, Disp: 180 tablet, Rfl: 3   Glucosamine HCl-MSM (GLUCOSAMINE-MSM PO), Take 1 tablet by mouth 2 (two) times daily., Disp: , Rfl:    melatonin 5 MG TABS, Take 5 mg by mouth at bedtime as needed (sleep)., Disp: , Rfl:    metoprolol tartrate (LOPRESSOR) 25 MG tablet, Take 1 tablet (25 mg total) by mouth daily as needed., Disp: 90 tablet, Rfl: 1   prednisoLONE acetate (PRED FORTE) 1 % ophthalmic suspension, SHAKE LIQUID AND INSTILL 1 DROP IN RIGHT EYE 6 TIMES DAILY, Disp: , Rfl:    sodium chloride (OCEAN) 0.65 % nasal spray, Place 1 spray into the nose 2 (two) times daily as needed for congestion. Reported on 01/19/2016, Disp: , Rfl:    venlafaxine XR (EFFEXOR XR)  75 MG 24 hr capsule, Take 1 capsule (75 mg total) by mouth daily with breakfast., Disp: 90 capsule, Rfl: 3   VEOZAH 45 MG TABS, TAKE 1 TABLET(45 MG) BY MOUTH AT BEDTIME, Disp: 30 tablet, Rfl: 2   ofloxacin (OCUFLOX) 0.3 % ophthalmic solution, Place into the right eye. (Patient not taking: Reported on 11/11/2023), Disp: , Rfl:  Allergies  Allergen Reactions   Codeine Nausea Only and Other (See Comments)   Gabapentin Other (See Comments)    Makes her very sleepy     ROS: A complete ROS was performed with pertinent positives/negatives noted in the HPI. The remainder of the ROS are negative.    Objective:   Today's Vitals   11/11/23 1355  BP: 114/68  Pulse: 67  Temp: 98 F (36.7 C)  TempSrc: Temporal  SpO2: 98%  Weight: 162 lb 6.4 oz (73.7 kg)  Height: 5' 5.5" (1.664 m)    GENERAL: Well-appearing, in NAD. Well nourished.  HEENT:    HEAD: Normocephalic, non-traumatic.  EYES: Conjunctive pink without exudate. PERRL, EOMI.  SKIN: Pink, warm and dry. No rash, lesion, ulceration. 1cm area of ecchymosis to L. Upper arm  NECK: Trachea midline. Full ROM w/o pain or tenderness. No lymphadenopathy.  RESPIRATORY: Chest wall symmetrical. Respirations even and non-labored. Breath sounds clear to auscultation bilaterally. CARDIAC: S1, S2 present, regular rate and rhythm. Peripheral pulses 2+ bilaterally.  MSK: Muscle tone and strength appropriate for age. Pain with palpation to left rib cage, no crepitus. Unable adduct left arm above 90 degrees secondary to pain. No obvious deformities.  EXTREMITIES: Without clubbing, cyanosis, or edema.  NEUROLOGIC: Steady, even gait.  PSYCH/MENTAL STATUS: Alert, oriented x 3. Cooperative, appropriate mood and affect.    No results found for any visits on 11/11/23.    Assessment & Plan:  Assessment and Plan    Fall with Left Rib Pain Fall onto left side onto a step stool, resulting in left rib pain. No loss of consciousness or confusion. Pain exacerbated  by movement and laughing. -Order chest x-ray to rule out rib fracture. -Continue Tylenol for pain management. Patient declined any stronger pain medication at this time.   Left Shoulder and Upper Arm Pain Pain and limited range of motion in left shoulder following fall. -Order x-ray of left shoulder to rule out fracture. -Continue Tylenol for pain management.  Head Trauma Hit head during fall, but no loss of consciousness or confusion. - patient is alert, ox4. Despite her being on blood thinners and sustaining a head injury from the fall 1 day ago, she shows no signs of neurological impairment that would suggest possible brain bleeding at this time. No CT head recommended at this time.  -Monitor for signs of confusion or other neurological changes due to anticoagulant use.  Follow-up Await results of x-rays and adjust treatment plan as necessary.      Orders Placed This Encounter  Procedures   DG Ribs Unilateral W/Chest Left    Standing Status:   Future    Number of Occurrences:   1    Standing Expiration Date:   11/10/2024    Order Specific Question:   Reason for exam:    Answer:   Injury, left sided rib pain. assess for fracture, pneumothorax. history of osteopenia    Order Specific Question:   Preferred imaging location?    Answer:   Internal    Order Specific Question:   Release to patient    Answer:   Immediate   DG Humerus Left    Standing Status:   Future    Number of Occurrences:   1    Standing Expiration Date:   11/10/2024    Order Specific Question:   Reason for Exam (SYMPTOM  OR DIAGNOSIS REQUIRED)    Answer:   fall from step stool x 1 day. concern for fracture . hx of osteopenia    Order Specific Question:   Preferred imaging location?    Answer:   Internal    Order Specific Question:   Release to patient    Answer:   Immediate   DG Shoulder Left    Please include true AP (Grashey view), scapular Y, and axillary views    Standing Status:   Future  Number of  Occurrences:   1    Standing Expiration Date:   11/10/2024    Order Specific Question:   Preferred imaging location?    Answer:   Internal    Order Specific Question:   Reason for exam:    Answer:   fall from step stool. concern for fracture. Hx of osteopenia    Comments:   Please include true AP (Grashey view), scapular Y, and axillary views    Order Specific Question:   Release to patient    Answer:   Immediate   Lab Orders  No laboratory test(s) ordered today   No images are attached to the encounter or orders placed in the encounter.  Return if symptoms worsen or fail to improve.   Salvatore Decent, FNP

## 2023-11-18 ENCOUNTER — Other Ambulatory Visit: Payer: Self-pay

## 2023-11-18 MED ORDER — FLECAINIDE ACETATE 100 MG PO TABS: 100.0000 mg | ORAL_TABLET | Freq: Two times a day (BID) | ORAL | 0 refills | Status: DC

## 2023-11-18 MED ORDER — DILTIAZEM HCL ER COATED BEADS 180 MG PO CP24: 180.0000 mg | ORAL_CAPSULE | Freq: Every day | ORAL | 0 refills | Status: DC

## 2023-11-19 ENCOUNTER — Ambulatory Visit: Payer: Medicare Other | Admitting: Family Medicine

## 2023-12-04 ENCOUNTER — Other Ambulatory Visit: Payer: Self-pay | Admitting: Family Medicine

## 2023-12-04 DIAGNOSIS — F419 Anxiety disorder, unspecified: Secondary | ICD-10-CM

## 2023-12-10 DIAGNOSIS — H353131 Nonexudative age-related macular degeneration, bilateral, early dry stage: Secondary | ICD-10-CM | POA: Diagnosis not present

## 2023-12-10 DIAGNOSIS — H40023 Open angle with borderline findings, high risk, bilateral: Secondary | ICD-10-CM | POA: Diagnosis not present

## 2023-12-10 DIAGNOSIS — H43813 Vitreous degeneration, bilateral: Secondary | ICD-10-CM | POA: Diagnosis not present

## 2023-12-10 DIAGNOSIS — H26492 Other secondary cataract, left eye: Secondary | ICD-10-CM | POA: Diagnosis not present

## 2023-12-10 DIAGNOSIS — H16141 Punctate keratitis, right eye: Secondary | ICD-10-CM | POA: Diagnosis not present

## 2023-12-10 DIAGNOSIS — Z961 Presence of intraocular lens: Secondary | ICD-10-CM | POA: Diagnosis not present

## 2023-12-10 DIAGNOSIS — H0102A Squamous blepharitis right eye, upper and lower eyelids: Secondary | ICD-10-CM | POA: Diagnosis not present

## 2023-12-10 DIAGNOSIS — Z947 Corneal transplant status: Secondary | ICD-10-CM | POA: Diagnosis not present

## 2023-12-10 DIAGNOSIS — H0102B Squamous blepharitis left eye, upper and lower eyelids: Secondary | ICD-10-CM | POA: Diagnosis not present

## 2024-01-23 ENCOUNTER — Telehealth: Payer: Self-pay

## 2024-01-23 NOTE — Telephone Encounter (Signed)
 Called patient to advise her that the [name] is to be taken only once daily per provider.   She will start taking the way it is directed. Dm/cma

## 2024-01-23 NOTE — Telephone Encounter (Unsigned)
 Copied from CRM [id number]. Topic: Clinical - Prescription Issue >> Jan 23, 2024 11:08 AM Corin V wrote: Reason for CRM: Patient picked up her VEOZAH 45 MG TABS and the instructions state 1 pill at noon but she spoke to Dr. Veto Kemps about taking 1 pill twice per day. She wants to make sure this gets changed for when she needs her refill called in.

## 2024-01-26 ENCOUNTER — Other Ambulatory Visit: Payer: Self-pay | Admitting: Family Medicine

## 2024-01-26 DIAGNOSIS — F419 Anxiety disorder, unspecified: Secondary | ICD-10-CM

## 2024-02-04 ENCOUNTER — Ambulatory Visit: Payer: Medicare Other | Admitting: Family Medicine

## 2024-02-10 ENCOUNTER — Other Ambulatory Visit: Payer: Self-pay | Admitting: Physician Assistant

## 2024-02-10 DIAGNOSIS — I480 Paroxysmal atrial fibrillation: Secondary | ICD-10-CM

## 2024-02-10 NOTE — Telephone Encounter (Signed)
 Prescription refill request for Eliquis received. Indication:afib Last office visit:3/24 Scr:1.05  3/24 Age: 89 Weight:73.7  kg  Prescription refilled

## 2024-02-11 ENCOUNTER — Ambulatory Visit: Payer: Self-pay | Admitting: Family Medicine

## 2024-02-11 NOTE — Telephone Encounter (Signed)
 Chief Complaint: cough Symptoms: productive cough  Frequency: 4-5 days Pertinent Negatives: Patient denies chest pain, fever, headache Disposition: [] ED /[] Urgent Care (no appt availability in office) / [] Appointment(In office/virtual)/ []  [facility]/ [x] Home Care/ [] Refused Recommended Disposition /[]  Mobile Bus/ []  Follow-up with PCP Additional Notes: Patient states that she has developed a cough over the last 4-5 days. She was recently on a 11 day cruise and now has a cough mostly at night that is sometimes productive with green phlegm. Patient states she is taking Delysm.   Copied from CRM [id number]. Topic: Clinical - Red Word Triage >> Feb 11, 2024  9:47 AM Theodis Sato wrote: Red Word that prompted transfer to Nurse Triage: Green mucus accompanied by a cough. Reason for Disposition  Cough  Answer Assessment - Initial Assessment Questions 1. ONSET: "When did the cough begin?"      4-5 days 2. SEVERITY: "How bad is the cough today?"      Only when laying flay 3. SPUTUM: "Describe the color of your sputum" (none, dry cough; clear, white, yellow, green)     green 4. HEMOPTYSIS: "Are you coughing up any blood?" If so ask: "How much?" (flecks, streaks, tablespoons, etc.)     no 5. DIFFICULTY BREATHING: "Are you having difficulty breathing?" If Yes, ask: "How bad is it?" (e.g., mild, moderate, severe)    - MILD: No SOB at rest, mild SOB with walking, speaks normally in sentences, can lie down, no retractions, pulse < 100.    - MODERATE: SOB at rest, SOB with minimal exertion and prefers to sit, cannot lie down flat, speaks in phrases, mild retractions, audible wheezing, pulse 100-120.    - SEVERE: Very SOB at rest, speaks in single words, struggling to breathe, sitting hunched forward, retractions, pulse > 120      none 6. FEVER: "Do you have a fever?" If Yes, ask: "What is your temperature, how was it measured, and when did it start?"     none 7. CARDIAC HISTORY:  "Do you have any history of heart disease?" (e.g., heart attack, congestive heart failure)      Hx of afib 8. LUNG HISTORY: "Do you have any history of lung disease?"  (e.g., pulmonary embolus, asthma, emphysema)     no 9. PE RISK FACTORS: "Do you have a history of blood clots?" (or: recent major surgery, recent prolonged travel, bedridden)     No but on eliquis 10. OTHER SYMPTOMS: "Do you have any other symptoms?" (e.g., runny nose, wheezing, chest pain)       Runny nose, possible sinus congestion 12. TRAVEL: "Have you traveled out of the country in the last month?" (e.g., travel history, exposures)       [place] cruise 11 days  Protocols used: Cough - Acute Productive-A-AH

## 2024-02-11 NOTE — Telephone Encounter (Signed)
 Left VM to rtn call.  Advised to call back if needs seen sooner than 02/14/24. Dm/cma

## 2024-02-12 ENCOUNTER — Ambulatory Visit: Payer: Medicare Other | Admitting: Family Medicine

## 2024-02-14 ENCOUNTER — Encounter: Payer: Self-pay | Admitting: Family Medicine

## 2024-02-14 ENCOUNTER — Ambulatory Visit: Payer: Medicare Other | Admitting: Family Medicine

## 2024-02-14 VITALS — BP 124/66 | HR 51 | Temp 97.8°F | Ht 65.5 in

## 2024-02-14 DIAGNOSIS — N951 Menopausal and female climacteric states: Secondary | ICD-10-CM

## 2024-02-14 DIAGNOSIS — F419 Anxiety disorder, unspecified: Secondary | ICD-10-CM | POA: Diagnosis not present

## 2024-02-14 DIAGNOSIS — I10 Essential (primary) hypertension: Secondary | ICD-10-CM

## 2024-02-14 LAB — HEPATIC FUNCTION PANEL
ALT: 18 U/L (ref 0–35)
AST: 18 U/L (ref 0–37)
Albumin: 4 g/dL (ref 3.5–5.2)
Alkaline Phosphatase: 93 U/L (ref 39–117)
Bilirubin, Direct: 0.1 mg/dL (ref 0.0–0.3)
Total Bilirubin: 0.3 mg/dL (ref 0.2–1.2)
Total Protein: 6.2 g/dL (ref 6.0–8.3)

## 2024-02-14 MED ORDER — VENLAFAXINE HCL ER 37.5 MG PO CP24: 37.5000 mg | ORAL_CAPSULE | Freq: Every day | ORAL | 3 refills | Status: AC

## 2024-02-14 NOTE — Assessment & Plan Note (Signed)
 This has been improved. She could give a trial off of her medicine, though I wonder if her symptoms might recur without this. I will plan to obtain LFTs today for monitoring of her medication.

## 2024-02-14 NOTE — Progress Notes (Signed)
 [facility] [address] [place] [address] Dept: [phone] Dept Fax: [phone]  Chronic Care Office Visit  Subjective:    Patient ID: Katherine Powell, female    DOB: [birth year], 89 y.o..   MRN: [id number]  Chief Complaint  Patient presents with   Hypertension    3 month HTN.     History of Present Illness:  Patient is in today for reassessment of chronic medical issues.  Katherine Powell has a history of hypertension. She is managed on diltiazem CD 180 mg daily. She also has stage 3a CKD.    Katherine Powell has a history of atrial fibrillation. She is managed on metoprolol tartrate 25 mg bid for rate control and flecainide 100 mg bid for rhythm control. She is on Eliquis 5 mg bid for anticoagulation.   Katherine Powell has a history of anxiety. She was previously taking only Xanax about once a week for this. She is now managed on venlafaxine XR 75 mg daily. She notes she rarely takes Xanax. She feels drowsy much of the day and wonders if her medicine could impact this.   Katherine Powell has a history of hot flashes. She has a history of breast cancer, so has not been a candidate for estrogen replacement. She is now on her on fezolinetant (Veozah) 45 mg daily. She is no longer noticing the hot flashes. She wonders if she still needs the medicine.  Past Medical History: Patient Active Problem List   Diagnosis Date Noted   Mild cognitive impairment 05/23/2023   Vasomotor symptoms due to menopause 04/17/2023   Mitral regurgitation 03/15/2023   Coronary artery calcification 03/04/2023   Precordial chest pain 02/13/2023   Arthropathy of lumbar facet joint 02/13/2022   Osteoarthritis of both hands 08/16/2021   Long term (current) use of anticoagulants 04/14/2021   History of colonic polyps 04/14/2021   Mixed incontinence 04/14/2021   History of cornea transplant 04/14/2021   Essential hypertension 04/14/2021   Degeneration of  lumbar intervertebral disc 04/14/2021   Corneal transplant failure, right eye 04/14/2021   Chronic kidney disease, stage 3a (HCC) 04/14/2021   Anxiety 04/14/2021   Allergic rhinitis 04/14/2021   Presbycusis of both ears 04/14/2021   Red blood cell antibody positive 12/05/2018   Multinodular goiter (nontoxic) 02/11/2017   Spinal stenosis of lumbar region 10/31/2016   Degenerative spondylolisthesis 10/11/2016   Osteopenia 07/18/2016   Lumbar radiculopathy 01/05/2015   Paroxysmal atrial fibrillation (HCC) 12/29/2014   Malignant neoplasm of upper-outer quadrant of right breast in female, estrogen receptor positive (HCC) 11/17/2013   Past Surgical History:  Procedure Laterality Date   ABDOMINAL HYSTERECTOMY  1973   endometriosis   APPENDECTOMY  1954   BACK SURGERY     lumb-2005   BILATERAL OOPHORECTOMY     benign tumor on ovary   BREAST LUMPECTOMY WITH NEEDLE LOCALIZATION Right 11/09/2013   Procedure: BREAST LUMPECTOMY WITH NEEDLE LOCALIZATION;  Surgeon: [name], MD;  Location: [facility];  Service: General;  Laterality: Right;   CATARACT EXTRACTION  2013   both   CYSTOCELE REPAIR  2008   EYE SURGERY     cataracts-plugs for eyes   RECTOCELE REPAIR  2008   TONSILLECTOMY     Family History  Problem Relation Age of Onset   Alzheimer's disease Mother    Stroke Father    Heart attack Neg Hx    Outpatient Medications Prior to Visit  Medication Sig Dispense Refill  acetaminophen (TYLENOL) 650 MG CR tablet Take 650 mg by mouth every 8 (eight) hours as needed for pain. Reported on 01/19/2016     ALPRAZolam (XANAX) 0.5 MG tablet TAKE 1/2 TABLET(0.25 MG) BY MOUTH TWICE DAILY AS NEEDED FOR ANXIETY 30 tablet 2   calcium citrate (CALCITRATE - DOSED IN MG ELEMENTAL CALCIUM) 950 (200 Ca) MG tablet Take 200 mg of elemental calcium by mouth daily.     diltiazem (CARDIZEM CD) 180 MG 24 hr capsule Take 1 capsule (180 mg total) by mouth daily. 90 capsule 0    diphenhydrAMINE (BENADRYL) 50 MG capsule Take 50 mg by mouth every 6 (six) hours as needed.     ELIQUIS 5 MG TABS tablet TAKE 1 TABLET TWICE A DAY 180 tablet 3   fexofenadine (ALLEGRA) 180 MG tablet Take 180 mg by mouth daily as needed (seasonal allergies).     flecainide (TAMBOCOR) 100 MG tablet Take 1 tablet (100 mg total) by mouth 2 (two) times daily. 180 tablet 0   Glucosamine HCl-MSM (GLUCOSAMINE-MSM PO) Take 1 tablet by mouth 2 (two) times daily.     melatonin 5 MG TABS Take 5 mg by mouth at bedtime as needed (sleep).     metoprolol tartrate (LOPRESSOR) 25 MG tablet Take 1 tablet (25 mg total) by mouth daily as needed. 90 tablet 1   ofloxacin (OCUFLOX) 0.3 % ophthalmic solution Place into the right eye.     prednisoLONE acetate (PRED FORTE) 1 % ophthalmic suspension SHAKE LIQUID AND INSTILL 1 DROP IN RIGHT EYE 6 TIMES DAILY     sodium chloride (OCEAN) 0.65 % nasal spray Place 1 spray into the nose 2 (two) times daily as needed for congestion. Reported on 01/19/2016     VEOZAH 45 MG TABS TAKE 1 TABLET(45 MG) BY MOUTH AT BEDTIME 30 tablet 2   venlafaxine XR (EFFEXOR-XR) 75 MG 24 hr capsule TAKE 1 CAPSULE DAILY WITH BREAKFAST 90 capsule 3   No facility-administered medications prior to visit.   Allergies  Allergen Reactions   Codeine Nausea Only and Other (See Comments)   Gabapentin Other (See Comments)    Makes her very sleepy   Objective:   Today's Vitals   02/14/24 1330  BP: 124/66  Pulse: (!) 51  Temp: 97.8 F (36.6 C)  TempSrc: Temporal  SpO2: 97%  Height: 5' 5.5" (1.664 m)   Body mass index is 26.61 kg/m.   General: Well developed, well nourished. No acute distress. Psych: Alert and oriented. Normal mood and affect.  There are no preventive care reminders to display for this patient.    Assessment & Plan:   Problem List Items Addressed This Visit       Cardiovascular and Mediastinum   Essential hypertension - Primary   Blood pressure is in good control.  Continue diltiazem CD 180 mg daily and metoprolol tartrate 25 mg twice daily.         Other   Anxiety   I will give her a trial of reducing her venlafaxine to 37.5 mg daily.      Relevant Medications   venlafaxine XR (EFFEXOR XR) 37.5 MG 24 hr capsule   Vasomotor symptoms due to menopause   This has been improved. She could give a trial off of her medicine, though I wonder if her symptoms might recur without this. I will plan to obtain LFTs today for monitoring of her medication.      Relevant Orders   Hepatic function panel    Return  in about 6 months (around 08/16/2024) for Reassessment.   Loyola Mast, MD

## 2024-02-14 NOTE — Assessment & Plan Note (Signed)
Blood pressure is in good control. Continue diltiazem CD 180 mg daily and metoprolol tartrate 25 mg twice daily.

## 2024-02-14 NOTE — Assessment & Plan Note (Signed)
 I will give her a trial of reducing her venlafaxine to 37.5 mg daily.

## 2024-02-17 ENCOUNTER — Telehealth: Payer: Self-pay | Admitting: Physician Assistant

## 2024-02-17 NOTE — Telephone Encounter (Signed)
 Can we get her in to the AFib Clinic this week? She has been seen there before. Tereso Newcomer, PA-C    02/17/2024 9:26 PM

## 2024-02-17 NOTE — Telephone Encounter (Signed)
 Patient c/o Palpitations:  STAT if patient reporting lightheadedness, shortness of breath, or chest pain  How long have you had palpitations/irregular HR/ Afib? Are you having the symptoms now? This month; no   Are you currently experiencing lightheadedness, SOB or CP? No   Do you have a history of afib (atrial fibrillation) or irregular heart rhythm? Yes   Have you checked your BP or HR? (document readings if available): High HR  Are you experiencing any other symptoms? Weakness

## 2024-02-17 NOTE — Telephone Encounter (Signed)
 Patient states she has had 3 breakthroughs of A-fib this month, which is more frequent than she has had in the past. She denies any SOB, CP, dizziness, or fatigue.  No BP/HR readings to report.  She is taking Lopressor 25 mg when these episodes occur, and sometimes will take an extra tablet if needed when first dose does not help.  Last episode was last week. No missed doses of Eliquis.  At last office visit in March 2024, it was mentioned she may need EP referral for recurrent episodes of A-Fib,  Will forward to [facility], PA-C to review and advise.    Will also send to scheduling team to reach out to patient to schedule overdue F/U appt with new provider (former Dr. Shari Prows patient).

## 2024-02-18 ENCOUNTER — Ambulatory Visit (HOSPITAL_COMMUNITY)
Admission: RE | Admit: 2024-02-18 | Discharge: 2024-02-18 | Disposition: A | Discharge status: Home / Self Care | Source: Ambulatory Visit | Attending: Internal Medicine | Admitting: Internal Medicine

## 2024-02-18 VITALS — BP 116/66 | HR 49 | Ht 65.5 in | Wt 159.6 lb

## 2024-02-18 DIAGNOSIS — D6869 Other thrombophilia: Secondary | ICD-10-CM | POA: Diagnosis not present

## 2024-02-18 DIAGNOSIS — I480 Paroxysmal atrial fibrillation: Secondary | ICD-10-CM | POA: Insufficient documentation

## 2024-02-18 DIAGNOSIS — Z79899 Other long term (current) drug therapy: Secondary | ICD-10-CM | POA: Insufficient documentation

## 2024-02-18 DIAGNOSIS — I080 Rheumatic disorders of both mitral and aortic valves: Secondary | ICD-10-CM | POA: Insufficient documentation

## 2024-02-18 DIAGNOSIS — I10 Essential (primary) hypertension: Secondary | ICD-10-CM | POA: Diagnosis not present

## 2024-02-18 DIAGNOSIS — Z7901 Long term (current) use of anticoagulants: Secondary | ICD-10-CM | POA: Insufficient documentation

## 2024-02-18 MED ORDER — AMIODARONE HCL 200 MG PO TABS: ORAL_TABLET | ORAL | 0 refills | Status: DC

## 2024-02-18 MED ORDER — DILTIAZEM HCL ER COATED BEADS 120 MG PO CP24: 120.0000 mg | ORAL_CAPSULE | Freq: Every day | ORAL | 0 refills | Status: DC

## 2024-02-18 NOTE — Progress Notes (Addendum)
 Primary Care Physician: Loyola Mast, MD Referring Physician:ED f/u Cardiology: Dr. Max Fickle Balingit is a 89 y.o. female with a h/o HTN and afib on flecainide that is in the afib clinic as a f/u from the ED. She went into afib in the setting of illness. Prior to going to the ED , she had taken 3 extra  100 mg flecainide one day and 200 mg extra flecainide the next day. She was felt to have CAP and was started on Levaquin and rates were slowed down to d/c home. Cardizem was increased to 180 mg daily.  She was asked to follow up in the afib clinic. She has a CHADSVASC score of 4.   Today, she is in SR. She is feeling improved. We discussed how to treat afib if it reoccurs and taking extra flecainide is not recommended and can be dangerous.   On follow up 02/18/24, she is currently in NSR. Patient called office 3/17 noting she has had 3 episodes of Afib this month which is more frequent than previous burden. She took metoprolol last night. She takes flecainide 100 mg BID and diltiazem 180 mg daily. No missed doses of Eliquis 5 mg BID.   Today, she denies symptoms of palpitations, chest pain, shortness of breath, orthopnea, PND, lower extremity edema, dizziness, presyncope, syncope, or neurologic sequela. The patient is tolerating medications without difficulties and is otherwise without complaint today.   Past Medical History:  Diagnosis Date   Acne rosacea 04/14/2021   Allergy    Anxiety    Arrhythmia    atrial fibrillation/  on    Arthritis    Atrial fibrillation (HCC)    Breast cancer (HCC) 11/09/2013   right upper outer   Contact lens/glasses fitting    contact rt eye   Coronary artery calcification 03/04/2023   CCTA 03/04/23: CAC score 27.2; total plaque volume is mild (8th percentile); min plaque LAD (<25)   Hx of radiation therapy 12/15/13- 01/11/14   right breast 4250 cGy in 17 sessions, (hypo-fractionated), no boost   Hypertension    Mitral regurgitation 03/15/2023    TTE 03/15/2023: EF 55-60, no RWMA, GLS -20.8, normal RVSF, normal PASP, RVSP 35.5, mild MR, mild AI, AV sclerosis, aortic root 4 cm, ascending aorta 3.7 cm (normal range), RAP 8   Postmenopausal    Rosacea    Seasonal allergies    Sleep disturbance    Thyroid disease    Urinary incontinence    Past Surgical History:  Procedure Laterality Date   ABDOMINAL HYSTERECTOMY  1973   endometriosis   APPENDECTOMY  1954   BACK SURGERY     lumb-2005   BILATERAL OOPHORECTOMY     benign tumor on ovary   BREAST LUMPECTOMY WITH NEEDLE LOCALIZATION Right 11/09/2013   Procedure: BREAST LUMPECTOMY WITH NEEDLE LOCALIZATION;  Surgeon: [name], MD;  Location: [facility];  Service: General;  Laterality: Right;   CATARACT EXTRACTION  2013   both   CYSTOCELE REPAIR  2008   EYE SURGERY     cataracts-plugs for eyes   RECTOCELE REPAIR  2008   TONSILLECTOMY      Current Outpatient Medications  Medication Sig Dispense Refill   acetaminophen (TYLENOL) 650 MG CR tablet Take 650 mg by mouth as needed for pain. Reported on 01/19/2016     ALPRAZolam (XANAX) 0.5 MG tablet TAKE 1/2 TABLET(0.25 MG) BY MOUTH TWICE DAILY AS NEEDED FOR ANXIETY 30 tablet 2  b complex vitamins capsule Take 1 capsule by mouth daily.     BIOTIN PO Take 1 tablet by mouth 2 (two) times daily. For Hair, Skin and Nails     carboxymethylcellulose (REFRESH PLUS) 0.5 % SOLN Place 1 drop into both eyes as needed.     cycloSPORINE (RESTASIS) 0.05 % ophthalmic emulsion Place 1 drop into both eyes as needed.     diltiazem (CARDIZEM CD) 180 MG 24 hr capsule Take 1 capsule (180 mg total) by mouth daily. 90 capsule 0   ELIQUIS 5 MG TABS tablet TAKE 1 TABLET TWICE A DAY 180 tablet 3   fexofenadine (ALLEGRA) 180 MG tablet Take 180 mg by mouth daily as needed (seasonal allergies).     flecainide (TAMBOCOR) 100 MG tablet Take 1 tablet (100 mg total) by mouth 2 (two) times daily. 180 tablet 0   Glucosamine HCl-MSM (GLUCOSAMINE-MSM  PO) Take 1 tablet by mouth 2 (two) times daily.     metoprolol tartrate (LOPRESSOR) 25 MG tablet Take 1 tablet (25 mg total) by mouth daily as needed. 90 tablet 1   Multiple Vitamins-Minerals (CITRACAL +D3 PO) Take 1 tablet by mouth in the morning and at bedtime.     Multiple Vitamins-Minerals (PRESERVISION AREDS PO) Take 1 tablet by mouth 2 (two) times daily. # 2     Polyethyl Glycol-Propyl Glycol 0.4-0.3 % SOLN Place 2 drops into both eyes daily.     prednisoLONE acetate (PRED FORTE) 1 % ophthalmic suspension She is only using once daily     sodium chloride (OCEAN) 0.65 % nasal spray Place 1 spray into the nose 2 (two) times daily as needed for congestion. Reported on 01/19/2016     venlafaxine XR (EFFEXOR XR) 37.5 MG 24 hr capsule Take 1 capsule (37.5 mg total) by mouth daily with breakfast. 90 capsule 3   VEOZAH 45 MG TABS TAKE 1 TABLET(45 MG) BY MOUTH AT BEDTIME (Patient taking differently: Takes at [facility]) 30 tablet 2   No current facility-administered medications for this encounter.    Allergies  Allergen Reactions   Codeine Nausea Only and Other (See Comments)   Gabapentin Other (See Comments)    Makes her very sleepy   ROS- All systems are reviewed and negative except as per the HPI above  Physical Exam: Vitals:   02/18/24 1409  Weight: 72.4 kg  Height: 5' 5.5" (1.664 m)    Wt Readings from Last 3 Encounters:  02/18/24 72.4 kg  11/11/23 73.7 kg  11/04/23 72.5 kg    Labs: Lab Results  Component Value Date   NA 139 02/13/2023   K 4.7 02/13/2023   CL 102 02/13/2023   CO2 23 02/13/2023   GLUCOSE 90 02/13/2023   BUN 27 02/13/2023   CREATININE 1.05 (H) 02/13/2023   CALCIUM 9.4 02/13/2023   MG 2.1 01/11/2022   Lab Results  Component Value Date   INR 1.05 11/09/2013   No results found for: "CHOL", "HDL", "LDLCALC", "TRIG"  GEN- The patient is well appearing, alert and oriented x 3 today.   Neck - no JVD or carotid bruit noted Lungs- Clear to ausculation  bilaterally, normal work of breathing Heart- Regular bradycardic rate and rhythm, no murmurs, rubs or gallops, PMI not laterally displaced Extremities- no clubbing, cyanosis, or edema Skin - no rash or ecchymosis noted   EKG- Vent. rate 49 BPM PR interval 180 ms QRS duration 92 ms QT/QTcB 460/415 ms P-R-T axes 60 -11 85 Sinus bradycardia Cannot rule out Anterior infarct ,  age undetermined Abnormal ECG When compared with ECG of 18-Jan-2022 14:07, PREVIOUS ECG IS PRESENT  Echo 03/15/23: 1. Left ventricular ejection fraction, by estimation, is 55 to 60%. The  left ventricle has normal function. The left ventricle has no regional  wall motion abnormalities. Left ventricular diastolic parameters are  indeterminate. Elevated left ventricular  end-diastolic pressure. The average left ventricular global longitudinal  strain is -20.8 %. The global longitudinal strain is normal.   2. Right ventricular systolic function is normal. The right ventricular  size is normal. There is normal pulmonary artery systolic pressure. The  estimated right ventricular systolic pressure is 35.5 mmHg.   3. The mitral valve is normal in structure. Mild mitral valve  regurgitation. No evidence of mitral stenosis.   4. The aortic valve is tricuspid. Aortic valve regurgitation is mild.  Aortic valve sclerosis/calcification is present, without any evidence of  aortic stenosis.   5. The aortic root measures 4cm and ascending aorta 3.7cm but when  indexed for age and gender, these dimensions are within normal range.   6. The inferior vena cava is normal in size with <50% respiratory  variability, suggesting right atrial pressure of 8 mmHg.   CHA2DS2-VASc Score = 4  The patient's score is based upon: CHF History: 0 HTN History: 1 Diabetes History: 0 Stroke History: 0 Vascular Disease History: 0 Age Score: 2 Gender Score: 1       ASSESSMENT AND PLAN: Paroxysmal Atrial Fibrillation (ICD10:  I48.0) The  patient's CHA2DS2-VASc score is 4, indicating a 4.8% annual risk of stroke.    She is currently in NSR. Continue Lopressor 25 mg PRN. We had a lengthy discussion about medication treatments and ablation in detail. We discussed potential options such as Multaq, increasing flecainide, Tikosyn, and amiodarone. We talked about the monitoring required for these medications, hospital admission for Tikosyn, and potential adverse effects. We did discuss how Multaq may be less indicated in this situation considering it may be less effective compared to flecainide. After discussion, patient elects to trial amiodarone for rhythm control therapy.  Will stop flecainide and wait for 72 hours before initiation of new antiarrhythmic. Start amiodarone 200 mg BID x 2 weeks, then transition to 200 mg once daily. Lower diltiazem to 120 mg daily. Will have to monitor HR while transitioning; her HR today is lower than normal due to taking PRN metoprolol yesterday evening. Follow up in 3 weeks for reassessment.    CrCl 42 mL/min Qtc 424 ms   Secondary Hypercoagulable State (ICD10:  D68.69) The patient is at significant risk for stroke/thromboembolism based upon her CHA2DS2-VASc Score of 4.  Continue Apixaban (Eliquis).  Continue Eliquis 5 mg BID without interruption; no missed doses.     Follow up 3 weeks to reassess.   Justin Mend, PA-C Afib Clinic [facility] [address] [place], [place] [address] [phone]

## 2024-02-18 NOTE — Addendum Note (Signed)
 Encounter addended by: Learta Codding, CMA on: 02/18/2024 3:39 PM  Actions taken: Pharmacy for encounter modified, Order list changed, Clinical Note Signed

## 2024-02-18 NOTE — Patient Instructions (Addendum)
 Discontinue Diltiazem 180 mg tablet Start Diltiazem 120 mg - Taking 1 tablet by mouth daily Stop Flecainide Start Amiodarone 200 mg - Taking 1 tablet by mouth twice daily x 2 weeks and after will start taking 1 tablet by mouth daily

## 2024-02-18 NOTE — Telephone Encounter (Signed)
 Left message for patient informing her of Scott's recommendation to be seen in A-Fib Clinic.  Staff message sent to A-Fib Clinic scheduler and Pixie Casino, RN to reach out to patient to schedule appt.

## 2024-02-19 ENCOUNTER — Other Ambulatory Visit: Payer: Self-pay | Admitting: Physician Assistant

## 2024-02-19 ENCOUNTER — Ambulatory Visit (HOSPITAL_COMMUNITY): Admitting: Internal Medicine

## 2024-02-19 ENCOUNTER — Other Ambulatory Visit (HOSPITAL_COMMUNITY): Payer: Self-pay | Admitting: *Deleted

## 2024-02-19 MED ORDER — AMIODARONE HCL 200 MG PO TABS: ORAL_TABLET | ORAL | 1 refills | Status: DC

## 2024-02-21 DIAGNOSIS — Z1231 Encounter for screening mammogram for malignant neoplasm of breast: Secondary | ICD-10-CM | POA: Diagnosis not present

## 2024-02-21 LAB — HM MAMMOGRAPHY

## 2024-02-24 ENCOUNTER — Encounter: Payer: Self-pay | Admitting: Family Medicine

## 2024-03-10 ENCOUNTER — Encounter (HOSPITAL_COMMUNITY): Payer: Self-pay | Admitting: Internal Medicine

## 2024-03-10 ENCOUNTER — Ambulatory Visit (HOSPITAL_COMMUNITY)
Admission: RE | Admit: 2024-03-10 | Discharge: 2024-03-10 | Disposition: A | Discharge status: Home / Self Care | Source: Ambulatory Visit | Attending: Internal Medicine | Admitting: Internal Medicine

## 2024-03-10 ENCOUNTER — Other Ambulatory Visit: Payer: Self-pay

## 2024-03-10 VITALS — BP 138/64 | HR 62 | Ht 65.5 in | Wt 163.2 lb

## 2024-03-10 DIAGNOSIS — Z7901 Long term (current) use of anticoagulants: Secondary | ICD-10-CM | POA: Insufficient documentation

## 2024-03-10 DIAGNOSIS — I10 Essential (primary) hypertension: Secondary | ICD-10-CM | POA: Diagnosis not present

## 2024-03-10 DIAGNOSIS — Z5181 Encounter for therapeutic drug level monitoring: Secondary | ICD-10-CM

## 2024-03-10 DIAGNOSIS — D6869 Other thrombophilia: Secondary | ICD-10-CM

## 2024-03-10 DIAGNOSIS — Z79899 Other long term (current) drug therapy: Secondary | ICD-10-CM | POA: Diagnosis not present

## 2024-03-10 DIAGNOSIS — I2510 Atherosclerotic heart disease of native coronary artery without angina pectoris: Secondary | ICD-10-CM | POA: Insufficient documentation

## 2024-03-10 DIAGNOSIS — I480 Paroxysmal atrial fibrillation: Secondary | ICD-10-CM | POA: Diagnosis not present

## 2024-03-10 MED ORDER — AMIODARONE HCL 200 MG PO TABS: ORAL_TABLET | ORAL | 1 refills | Status: DC

## 2024-03-10 NOTE — Patient Instructions (Signed)
 Amiodarone 200mg  twice a day for 14 more days then reduce to once a day

## 2024-03-10 NOTE — Progress Notes (Signed)
 Primary Care Physician: Loyola Mast, MD Referring Physician:ED f/u Cardiology: Dr. Max Fickle Etienne is a 89 y.o. female with a h/o HTN and afib on flecainide that is in the afib clinic as a f/u from the ED. She went into afib in the setting of illness. Prior to going to the ED , she had taken 3 extra  100 mg flecainide one day and 200 mg extra flecainide the next day. She was felt to have CAP and was started on Levaquin and rates were slowed down to d/c home. Cardizem was increased to 180 mg daily.  She was asked to follow up in the afib clinic. She has a CHADSVASC score of 4.   Today, she is in SR. She is feeling improved. We discussed how to treat afib if it reoccurs and taking extra flecainide is not recommended and can be dangerous.   On follow up 02/18/24, she is currently in NSR. Patient called office 3/17 noting she has had 3 episodes of Afib this month which is more frequent than previous burden. She took metoprolol last night. She takes flecainide 100 mg BID and diltiazem 180 mg daily. No missed doses of Eliquis 5 mg BID.   On follow up 03/10/24, she is currently in NSR. She transitioned from flecainide to amiodarone at last office visit. I lowered diltiazem to 120 mg daily to increase heart rate. She transitioned to amiodarone 200 mg once daily and did notice an episode of Afib yesterday morning which lasted 2-3 hours. The episode resolved after she took a nap. No missed doses of Eliquis 5 mg BID.   Today, she denies symptoms of palpitations, chest pain, shortness of breath, orthopnea, PND, lower extremity edema, dizziness, presyncope, syncope, or neurologic sequela. The patient is tolerating medications without difficulties and is otherwise without complaint today.   Past Medical History:  Diagnosis Date   Acne rosacea 04/14/2021   Allergy    Anxiety    Arrhythmia    atrial fibrillation/  on    Arthritis    Atrial fibrillation (HCC)    Breast cancer (HCC) 11/09/2013    right upper outer   Contact lens/glasses fitting    contact rt eye   Coronary artery calcification 03/04/2023   CCTA 03/04/23: CAC score 27.2; total plaque volume is mild (8th percentile); min plaque LAD (<25)   Hx of radiation therapy 12/15/13- 01/11/14   right breast 4250 cGy in 17 sessions, (hypo-fractionated), no boost   Hypertension    Mitral regurgitation 03/15/2023   TTE 03/15/2023: EF 55-60, no RWMA, GLS -20.8, normal RVSF, normal PASP, RVSP 35.5, mild MR, mild AI, AV sclerosis, aortic root 4 cm, ascending aorta 3.7 cm (normal range), RAP 8   Postmenopausal    Rosacea    Seasonal allergies    Sleep disturbance    Thyroid disease    Urinary incontinence    Past Surgical History:  Procedure Laterality Date   ABDOMINAL HYSTERECTOMY  1973   endometriosis   APPENDECTOMY  1954   BACK SURGERY     lumb-2005   BILATERAL OOPHORECTOMY     benign tumor on ovary   BREAST LUMPECTOMY WITH NEEDLE LOCALIZATION Right 11/09/2013   Procedure: BREAST LUMPECTOMY WITH NEEDLE LOCALIZATION;  Surgeon: [name], MD;  Location: [facility];  Service: General;  Laterality: Right;   CATARACT EXTRACTION  2013   both   CYSTOCELE REPAIR  2008   EYE SURGERY     cataracts-plugs  for eyes   RECTOCELE REPAIR  2008   TONSILLECTOMY      Current Outpatient Medications  Medication Sig Dispense Refill   acetaminophen (TYLENOL) 650 MG CR tablet Take 650 mg by mouth as needed for pain. Reported on 01/19/2016     ALPRAZolam (XANAX) 0.5 MG tablet TAKE 1/2 TABLET(0.25 MG) BY MOUTH TWICE DAILY AS NEEDED FOR ANXIETY 30 tablet 2   amiodarone (PACERONE) 200 MG tablet Take 1 tablet (200 mg total) by mouth 2 (two) times daily for 14 days, THEN 1 tablet (200 mg total) daily. Start 02/22/2024. 45 tablet 1   b complex vitamins capsule Take 1 capsule by mouth daily.     BIOTIN PO Take 1 tablet by mouth 2 (two) times daily. For Hair, Skin and Nails     carboxymethylcellulose (REFRESH PLUS) 0.5 % SOLN  Place 1 drop into both eyes as needed.     cycloSPORINE (RESTASIS) 0.05 % ophthalmic emulsion Place 1 drop into both eyes as needed.     diltiazem (CARDIZEM CD) 120 MG 24 hr capsule Take 1 capsule (120 mg total) by mouth daily. 90 capsule 0   ELIQUIS 5 MG TABS tablet TAKE 1 TABLET TWICE A DAY 180 tablet 3   fexofenadine (ALLEGRA) 180 MG tablet Take 180 mg by mouth daily as needed (seasonal allergies).     Glucosamine HCl-MSM (GLUCOSAMINE-MSM PO) Take 1 tablet by mouth 2 (two) times daily.     metoprolol tartrate (LOPRESSOR) 25 MG tablet Take 1 tablet (25 mg total) by mouth daily as needed. 90 tablet 1   Multiple Vitamins-Minerals (CITRACAL +D3 PO) Take 1 tablet by mouth in the morning and at bedtime.     Multiple Vitamins-Minerals (PRESERVISION AREDS PO) Take 1 tablet by mouth 2 (two) times daily. # 2     Polyethyl Glycol-Propyl Glycol 0.4-0.3 % SOLN Place 2 drops into both eyes daily.     prednisoLONE acetate (PRED FORTE) 1 % ophthalmic suspension She is only using once daily     sodium chloride (OCEAN) 0.65 % nasal spray Place 1 spray into the nose 2 (two) times daily as needed for congestion. Reported on 01/19/2016     venlafaxine XR (EFFEXOR XR) 37.5 MG 24 hr capsule Take 1 capsule (37.5 mg total) by mouth daily with breakfast. 90 capsule 3   VEOZAH 45 MG TABS TAKE 1 TABLET(45 MG) BY MOUTH AT BEDTIME (Patient taking differently: Takes at [facility]) 30 tablet 2   No current facility-administered medications for this encounter.    Allergies  Allergen Reactions   Codeine Nausea Only and Other (See Comments)   Gabapentin Other (See Comments)    Makes her very sleepy   ROS- All systems are reviewed and negative except as per the HPI above  Physical Exam: Vitals:   03/10/24 1424  BP: 138/64  Pulse: 62  Weight: 74 kg  Height: 5' 5.5" (1.664 m)     Wt Readings from Last 3 Encounters:  03/10/24 74 kg  02/18/24 72.4 kg  11/11/23 73.7 kg    Labs: Lab Results  Component Value Date   NA  139 02/13/2023   K 4.7 02/13/2023   CL 102 02/13/2023   CO2 23 02/13/2023   GLUCOSE 90 02/13/2023   BUN 27 02/13/2023   CREATININE 1.05 (H) 02/13/2023   CALCIUM 9.4 02/13/2023   MG 2.1 01/11/2022   Lab Results  Component Value Date   INR 1.05 11/09/2013   No results found for: "CHOL", "HDL", "LDLCALC", "TRIG"  GEN- The patient is well appearing, alert and oriented x 3 today.   Neck - no JVD or carotid bruit noted Lungs- Clear to ausculation bilaterally, normal work of breathing Heart- Regular rate and rhythm, no murmurs, rubs or gallops, PMI not laterally displaced Extremities- no clubbing, cyanosis, or edema Skin - no rash or ecchymosis noted   EKG- Vent. rate 62 BPM PR interval 158 ms QRS duration 96 ms QT/QTcB 422/428 ms P-R-T axes 52 -2 80 Normal sinus rhythm Low voltage QRS Incomplete right bundle branch block Borderline ECG When compared with ECG of 18-Feb-2024 14:28, PREVIOUS ECG IS PRESENT  Echo 03/15/23: 1. Left ventricular ejection fraction, by estimation, is 55 to 60%. The  left ventricle has normal function. The left ventricle has no regional  wall motion abnormalities. Left ventricular diastolic parameters are  indeterminate. Elevated left ventricular  end-diastolic pressure. The average left ventricular global longitudinal  strain is -20.8 %. The global longitudinal strain is normal.   2. Right ventricular systolic function is normal. The right ventricular  size is normal. There is normal pulmonary artery systolic pressure. The  estimated right ventricular systolic pressure is 35.5 mmHg.   3. The mitral valve is normal in structure. Mild mitral valve  regurgitation. No evidence of mitral stenosis.   4. The aortic valve is tricuspid. Aortic valve regurgitation is mild.  Aortic valve sclerosis/calcification is present, without any evidence of  aortic stenosis.   5. The aortic root measures 4cm and ascending aorta 3.7cm but when  indexed for age and  gender, these dimensions are within normal range.   6. The inferior vena cava is normal in size with <50% respiratory  variability, suggesting right atrial pressure of 8 mmHg.   CHA2DS2-VASc Score = 4  The patient's score is based upon: CHF History: 0 HTN History: 1 Diabetes History: 0 Stroke History: 0 Vascular Disease History: 0 Age Score: 2 Gender Score: 1       ASSESSMENT AND PLAN: Paroxysmal Atrial Fibrillation (ICD10:  I48.0) The patient's CHA2DS2-VASc score is 4, indicating a 4.8% annual risk of stroke.    She is currently in NSR. She notes an episode of Afib yesterday which lasted 2-3 hours. Will reload amiodarone 200 mg BID for an additional 2 weeks then transition to 200 mg once daily.   High risk medication monitoring (ICD10: R7229428) Patient requires ongoing monitoring for anti-arrhythmic medication which has the potential to cause life threatening arrhythmias or AV block. Qtc stable. Reload amiodarone 200 mg BID for additional 2 weeks then transition to 200 mg once daily.  Secondary Hypercoagulable State (ICD10:  D68.69) The patient is at significant risk for stroke/thromboembolism based upon her CHA2DS2-VASc Score of 4.  Continue Apixaban (Eliquis).  Continue Eliquis without interruption.     Follow up 3 months for amiodarone surveillance.  Justin Mend, PA-C [facility] [address] [place], [place] [address] [phone]

## 2024-03-12 ENCOUNTER — Other Ambulatory Visit (HOSPITAL_COMMUNITY): Payer: Self-pay | Admitting: *Deleted

## 2024-03-12 MED ORDER — AMIODARONE HCL 200 MG PO TABS: ORAL_TABLET | ORAL | 2 refills | Status: DC

## 2024-03-23 ENCOUNTER — Ambulatory Visit (INDEPENDENT_AMBULATORY_CARE_PROVIDER_SITE_OTHER): Admitting: Family Medicine

## 2024-03-23 ENCOUNTER — Encounter: Payer: Self-pay | Admitting: Family Medicine

## 2024-03-23 VITALS — BP 118/70 | HR 84 | Temp 97.9°F | Ht 65.5 in | Wt 159.2 lb

## 2024-03-23 DIAGNOSIS — G3184 Mild cognitive impairment, so stated: Secondary | ICD-10-CM

## 2024-03-23 DIAGNOSIS — N3001 Acute cystitis with hematuria: Secondary | ICD-10-CM

## 2024-03-23 DIAGNOSIS — I480 Paroxysmal atrial fibrillation: Secondary | ICD-10-CM

## 2024-03-23 DIAGNOSIS — I10 Essential (primary) hypertension: Secondary | ICD-10-CM

## 2024-03-23 LAB — POCT URINALYSIS DIPSTICK
Bilirubin, UA: NEGATIVE
Glucose, UA: NEGATIVE
Ketones, UA: NEGATIVE
Nitrite, UA: NEGATIVE
Protein, UA: NEGATIVE
Spec Grav, UA: 1.01 (ref 1.010–1.025)
Urobilinogen, UA: 0.2 U/dL
pH, UA: 7.5 (ref 5.0–8.0)

## 2024-03-23 MED ORDER — PHENAZOPYRIDINE HCL 100 MG PO TABS: 100.0000 mg | ORAL_TABLET | Freq: Three times a day (TID) | ORAL | 0 refills | Status: DC | PRN

## 2024-03-23 MED ORDER — SULFAMETHOXAZOLE-TRIMETHOPRIM 800-160 MG PO TABS: 1.0000 | ORAL_TABLET | Freq: Two times a day (BID) | ORAL | 0 refills | Status: DC

## 2024-03-23 NOTE — Assessment & Plan Note (Signed)
 Appears stable at this point. I would still consider a repeat MMSE at some point, but by history, her issues do not appear to meet criteria for dementia.

## 2024-03-23 NOTE — Assessment & Plan Note (Signed)
Blood pressure is in good control. Continue diltiazem CD 180 mg daily and metoprolol tartrate 25 mg twice daily.

## 2024-03-23 NOTE — Assessment & Plan Note (Signed)
 Stable. Rate well controlled. Continue metoprolol  tartrate 25 mg bid and amiodarone  200 mg daily. Continue Eliquis  5 mg bid for anticoagulation.

## 2024-03-23 NOTE — Progress Notes (Signed)
 [facility] [address] [place] [address] Dept: [phone] Dept Fax: [phone]  Office Visit  Subjective:    Patient ID: Katherine Powell, female    DOB: [birth year], 89 y.o..   MRN: [id number]  Chief Complaint  Patient presents with   Follow-up    Follow to discuss feeling no energy.  C/o having low abdominal pain/dysuria.     History of Present Illness:  Patient is in today complaining of several issues.  Katherine Powell has a history of mild cognitive impairment, primarily with some memory impairment. She notes her husband continues to express concerns. She notes she does have difficulty with names. She does not have trouble, generally, with navigating places with her car. If she misses a turn, she finds she can figure out an alternate route without trouble.  Katherine Powell notes an episode last week where she suddenly felt numb all over and weak. She had to sit down. This passed in about 40 seconds and afterwards she felt okay. She had no sign of residual weakness and no slurred speech.  Katherine Powell admits that she gets up to urinate 3-4 times a night and has for the past 2 years. She has a rare episode of incontinence, when she feels an urge to urinate and doesn't go right away. She has noted a cloudiness to her urine and some suprapubic discomfort more recently.  Katherine Powell has a history of hypertension. She is managed on diltiazem  CD 180 mg daily. She also has moderate CKD.    Katherine Powell has a history of atrial fibrillation. She is managed on metoprolol  tartrate 25 mg bid for rate control and amiodarone  200 mg daily for rhythm control. She is on Eliquis  5 mg bid for anticoagulation.  Past Medical History: Patient Active Problem List   Diagnosis Date Noted   Hypercoagulable state due to paroxysmal atrial fibrillation (HCC) 02/18/2024   Mild cognitive impairment 05/23/2023   Vasomotor symptoms due to menopause  04/17/2023   Mitral regurgitation 03/15/2023   Coronary artery calcification 03/04/2023   Precordial chest pain 02/13/2023   Arthropathy of lumbar facet joint 02/13/2022   Osteoarthritis of both hands 08/16/2021   Encounter for monitoring amiodarone  therapy 04/14/2021   History of colonic polyps 04/14/2021   Mixed incontinence 04/14/2021   History of cornea transplant 04/14/2021   Essential hypertension 04/14/2021   Degeneration of lumbar intervertebral disc 04/14/2021   Corneal transplant failure, right eye 04/14/2021   Chronic kidney disease, stage 3a (HCC) 04/14/2021   Anxiety 04/14/2021   Allergic rhinitis 04/14/2021   Presbycusis of both ears 04/14/2021   Red blood cell antibody positive 12/05/2018   Multinodular goiter (nontoxic) 02/11/2017   Spinal stenosis of lumbar region 10/31/2016   Degenerative spondylolisthesis 10/11/2016   Osteopenia 07/18/2016   Lumbar radiculopathy 01/05/2015   Paroxysmal atrial fibrillation (HCC) 12/29/2014   Malignant neoplasm of upper-outer quadrant of right breast in female, estrogen receptor positive (HCC) 11/17/2013   Past Surgical History:  Procedure Laterality Date   ABDOMINAL HYSTERECTOMY  1973   endometriosis   APPENDECTOMY  1954   BACK SURGERY     lumb-2005   BILATERAL OOPHORECTOMY     benign tumor on ovary   BREAST LUMPECTOMY WITH NEEDLE LOCALIZATION Right 11/09/2013   Procedure: BREAST LUMPECTOMY WITH NEEDLE LOCALIZATION;  Surgeon: Rogena Class, MD;  Location:  [facility];  Service: General;  Laterality: Right;   CATARACT EXTRACTION  2013   both   CYSTOCELE  REPAIR  2008   EYE SURGERY     cataracts-plugs for eyes   RECTOCELE REPAIR  2008   TONSILLECTOMY     Family History  Problem Relation Age of Onset   Alzheimer's disease Mother    Stroke Father    Heart attack Neg Hx    Outpatient Medications Prior to Visit  Medication Sig Dispense Refill   acetaminophen  (TYLENOL ) 650 MG CR tablet Take 650 mg by  mouth as needed for pain. Reported on 01/19/2016     ALPRAZolam  (XANAX ) 0.5 MG tablet TAKE 1/2 TABLET(0.25 MG) BY MOUTH TWICE DAILY AS NEEDED FOR ANXIETY 30 tablet 2   amiodarone  (PACERONE ) 200 MG tablet Take 1 tablet (200 mg total) by mouth 2 (two) times daily for 14 days, THEN 1 tablet (200 mg total) daily. Start 02/22/2024. 120 tablet 2   anastrozole  (ARIMIDEX ) 1 MG tablet Take 1 mg by mouth daily.     b complex vitamins capsule Take 1 capsule by mouth daily.     BIOTIN PO Take 1 tablet by mouth 2 (two) times daily. For Hair, Skin and Nails     carboxymethylcellulose (REFRESH PLUS) 0.5 % SOLN Place 1 drop into both eyes as needed.     cycloSPORINE (RESTASIS) 0.05 % ophthalmic emulsion Place 1 drop into both eyes as needed.     diltiazem  (CARDIZEM  CD) 120 MG 24 hr capsule Take 1 capsule (120 mg total) by mouth daily. 90 capsule 0   ELIQUIS  5 MG TABS tablet TAKE 1 TABLET TWICE A DAY 180 tablet 3   fexofenadine (ALLEGRA) 180 MG tablet Take 180 mg by mouth daily as needed (seasonal allergies).     Glucosamine HCl-MSM (GLUCOSAMINE-MSM PO) Take 1 tablet by mouth 2 (two) times daily.     metoprolol  tartrate (LOPRESSOR ) 25 MG tablet Take 1 tablet (25 mg total) by mouth daily as needed. 90 tablet 1   Multiple Vitamins-Minerals (CITRACAL +D3 PO) Take 1 tablet by mouth in the morning and at bedtime.     Multiple Vitamins-Minerals (PRESERVISION AREDS PO) Take 1 tablet by mouth 2 (two) times daily. # 2     Polyethyl Glycol-Propyl Glycol 0.4-0.3 % SOLN Place 2 drops into both eyes daily.     prednisoLONE acetate (PRED FORTE) 1 % ophthalmic suspension She is only using once daily     sodium chloride  (OCEAN) 0.65 % nasal spray Place 1 spray into the nose 2 (two) times daily as needed for congestion. Reported on 01/19/2016     venlafaxine  XR (EFFEXOR  XR) 37.5 MG 24 hr capsule Take 1 capsule (37.5 mg total) by mouth daily with breakfast. 90 capsule 3   VEOZAH  45 MG TABS TAKE 1 TABLET(45 MG) BY MOUTH AT BEDTIME  (Patient taking differently: Takes at [facility]) 30 tablet 2   No facility-administered medications prior to visit.   Allergies  Allergen Reactions   Codeine Nausea Only and Other (See Comments)   Gabapentin  Other (See Comments)    Makes her very sleepy     Objective:   Today's Vitals   03/23/24 1031  BP: 118/70  Pulse: 84  Temp: 97.9 F (36.6 C)  TempSrc: Temporal  SpO2: 97%  Weight: 159 lb 3.2 oz (72.2 kg)  Height: 5' 5.5" (1.664 m)   Body mass index is 26.09 kg/m.   General: Well developed, well nourished. No acute distress. CV: RRR without murmurs or rubs. Pulses 2+ bilaterally. Extremities: 1-2+ edema noted. Psych: Alert and oriented. Normal mood and affect.  Health Maintenance  Due  Topic Date Due   Medicare Annual Wellness (AWV)  05/19/2024     Lab Results: Component Ref Range & Units (hover) 10:48  Color, UA clear  Clarity, UA clear  Glucose, UA Negative  Bilirubin, UA neg  Ketones, UA neg  Spec Grav, UA 1.010  Blood, UA 1+  pH, UA 7.5  Protein, UA Negative  Urobilinogen, UA 0.2  Nitrite, UA neg  Leukocytes, UA Large (3+) Abnormal    Assessment & Plan:   Problem List Items Addressed This Visit       Cardiovascular and Mediastinum   Paroxysmal atrial fibrillation (HCC) (Chronic)   Stable. Rate well controlled. Continue metoprolol  tartrate 25 mg bid and amiodarone  200 mg daily. Continue Eliquis  5 mg bid for anticoagulation.      Essential hypertension   Blood pressure is in good control. Continue diltiazem  CD 180 mg daily and metoprolol  tartrate 25 mg twice daily.         Other   Mild cognitive impairment   Appears stable at this point. I would still consider a repeat MMSE at some point, but by history, her issues do not appear to meet criteria for dementia.      Other Visit Diagnoses       Acute cystitis with hematuria    -  Primary   UA and symptoms consistent with a UTI. I will treat with Septra  and Pyridium . If nocturia persists after  this regimen, will consider meds for OAB.   Relevant Medications   sulfamethoxazole -trimethoprim  (BACTRIM  DS) 800-160 MG tablet   phenazopyridine  (PYRIDIUM ) 100 MG tablet   Other Relevant Orders   POCT Urinalysis Dipstick (Completed)   Urine Culture       Return for Follow-up as scheduled.   Graig Lawyer, MD

## 2024-03-25 ENCOUNTER — Encounter: Payer: Self-pay | Admitting: Family Medicine

## 2024-03-25 LAB — URINE CULTURE
MICRO NUMBER:: 16353641
SPECIMEN QUALITY:: ADEQUATE

## 2024-03-26 DIAGNOSIS — H353133 Nonexudative age-related macular degeneration, bilateral, advanced atrophic without subfoveal involvement: Secondary | ICD-10-CM | POA: Diagnosis not present

## 2024-03-26 DIAGNOSIS — H1789 Other corneal scars and opacities: Secondary | ICD-10-CM | POA: Diagnosis not present

## 2024-03-26 DIAGNOSIS — H35033 Hypertensive retinopathy, bilateral: Secondary | ICD-10-CM | POA: Diagnosis not present

## 2024-03-30 ENCOUNTER — Telehealth: Payer: Self-pay

## 2024-03-30 NOTE — Telephone Encounter (Signed)
 Copied from CRM [id number]. Topic: Clinical - Medication Question >> Mar 30, 2024 11:53 AM Adonis Hoot wrote: Reason for CRM: Patients husband would like a phone call to discuss the reasons why she is prescribed VEOZAH  45 MG TABS.He is trying to prepare all of her medications.

## 2024-03-30 NOTE — Telephone Encounter (Signed)
 Spoke to Pt and husband regarding message below; all questions and concerns were answered and Pt verbalized understanding.

## 2024-04-14 ENCOUNTER — Other Ambulatory Visit: Payer: Self-pay | Admitting: Family Medicine

## 2024-04-14 DIAGNOSIS — N951 Menopausal and female climacteric states: Secondary | ICD-10-CM

## 2024-05-04 ENCOUNTER — Other Ambulatory Visit (HOSPITAL_COMMUNITY): Payer: Self-pay | Admitting: Internal Medicine

## 2024-05-07 DIAGNOSIS — H1789 Other corneal scars and opacities: Secondary | ICD-10-CM | POA: Diagnosis not present

## 2024-05-07 DIAGNOSIS — H35033 Hypertensive retinopathy, bilateral: Secondary | ICD-10-CM | POA: Diagnosis not present

## 2024-05-07 DIAGNOSIS — H353133 Nonexudative age-related macular degeneration, bilateral, advanced atrophic without subfoveal involvement: Secondary | ICD-10-CM | POA: Diagnosis not present

## 2024-05-07 DIAGNOSIS — Z961 Presence of intraocular lens: Secondary | ICD-10-CM | POA: Diagnosis not present

## 2024-05-21 ENCOUNTER — Ambulatory Visit (HOSPITAL_COMMUNITY): Admitting: Internal Medicine

## 2024-05-21 DIAGNOSIS — D692 Other nonthrombocytopenic purpura: Secondary | ICD-10-CM | POA: Diagnosis not present

## 2024-05-21 DIAGNOSIS — Z85828 Personal history of other malignant neoplasm of skin: Secondary | ICD-10-CM | POA: Diagnosis not present

## 2024-05-21 DIAGNOSIS — L821 Other seborrheic keratosis: Secondary | ICD-10-CM | POA: Diagnosis not present

## 2024-05-27 DIAGNOSIS — Z947 Corneal transplant status: Secondary | ICD-10-CM | POA: Diagnosis not present

## 2024-06-01 ENCOUNTER — Other Ambulatory Visit (HOSPITAL_COMMUNITY): Payer: Self-pay | Admitting: Internal Medicine

## 2024-06-10 ENCOUNTER — Ambulatory Visit

## 2024-06-10 VITALS — Ht 65.5 in | Wt 159.0 lb

## 2024-06-10 DIAGNOSIS — Z0000 Encounter for general adult medical examination without abnormal findings: Secondary | ICD-10-CM | POA: Diagnosis not present

## 2024-06-10 NOTE — Patient Instructions (Signed)
 Katherine Powell , Thank you for taking time out of your busy schedule to complete your Annual Wellness Visit with me. I enjoyed our conversation and look forward to speaking with you again next year. I, as well as your care team,  appreciate your ongoing commitment to your health goals. Please review the following plan we discussed and let me know if I can assist you in the future. Your Game plan/ To Do List     Follow up Visits: Next Medicare AWV with our clinical staff: In 1 year    Have you seen your provider in the last 6 months (3 months if uncontrolled diabetes)? Yes Next Office Visit with your provider: 08/14/24 @ 10:40  Clinician Recommendations:  Aim for 30 minutes of exercise or brisk walking, 6-8 glasses of water, and 5 servings of fruits and vegetables each day.       This is a list of the screening recommended for you and due dates:  Health Maintenance  Topic Date Due   COVID-19 Vaccine (9 - 2024-25 season) 08/04/2023   Flu Shot  07/03/2024   Medicare Annual Wellness Visit  06/10/2025   DTaP/Tdap/Td vaccine (3 - Td or Tdap) 01/05/2029   Pneumococcal Vaccine for age over 67  Completed   DEXA scan (bone density measurement)  Completed   Zoster (Shingles) Vaccine  Completed   Hepatitis B Vaccine  Aged Out   HPV Vaccine  Aged Out   Meningitis B Vaccine  Aged Out    Advanced directives: (ACP Link)Information on Advanced Care Planning can be found at Adult nurse Advance Health Care Directives Advance Health Care Directives. [email or web address]   Advance Care Planning is important because it:  [x]  Makes sure you receive the medical care that is consistent with your values, goals, and preferences  [x]  It provides guidance to your family and loved ones and reduces their decisional burden about whether or not they are making the right decisions based on your wishes.  Follow the link provided in your after visit summary or read over the paperwork we have mailed to you to  help you started getting your Advance Directives in place. If you need assistance in completing these, please reach out to us  so that we can help you!  See attachments for Preventive Care and Fall Prevention Tips.

## 2024-06-10 NOTE — Progress Notes (Signed)
 Subjective:   Katherine Powell is a 89 y.o. who presents for a Medicare Wellness preventive visit.  As a reminder, Annual Wellness Visits don't include a physical exam, and some assessments may be limited, especially if this visit is performed virtually. We may recommend an in-person follow-up visit with your provider if needed.  Visit Complete: Virtual I connected with  Katherine Powell on 06/10/24 by a audio enabled telemedicine application and verified that I am speaking with the correct person using two identifiers.  Patient Location: Home  Provider Location: Home Office  I discussed the limitations of evaluation and management by telemedicine. The patient expressed understanding and agreed to proceed.  Vital Signs: Because this visit was a virtual/telehealth visit, some criteria may be missing or patient reported. Any vitals not documented were not able to be obtained and vitals that have been documented are patient reported.  VideoDeclined- This patient declined Librarian, academic. Therefore the visit was completed with audio only.  Persons Participating in Visit: Patient.  AWV Questionnaire: No: Patient Medicare AWV questionnaire was not completed prior to this visit.  Cardiac Risk Factors include: advanced age (>44men, >102 women);hypertension     Objective:    Today's Vitals   06/10/24 1114  Weight: 159 lb (72.1 kg)  Height: 5' 5.5 (1.664 m)   Body mass index is 26.06 kg/m.     06/10/2024    1:46 PM 05/20/2023   10:20 AM 05/15/2022   11:08 AM 02/03/2019    9:21 AM 12/24/2017    8:52 AM 01/17/2017    4:23 PM 08/29/2016    3:24 PM  Advanced Directives  Does Patient Have a Medical Advance Directive? No Yes Yes No  No  No  No   Type of Special educational needs teacher of [place];Living will Healthcare Power of [place];Living will      Copy of Healthcare Power of Attorney in Chart?  No - copy requested No - copy requested      Would  patient like information on creating a medical advance directive? Yes (MAU/Ambulatory/Procedural Areas - Information given)   No - Patient declined    No - patient declined information      Data saved with a previous flowsheet row definition    Current Medications (verified) Outpatient Encounter Medications as of 06/10/2024  Medication Sig   acetaminophen  (TYLENOL ) 650 MG CR tablet Take 650 mg by mouth as needed for pain. Reported on 01/19/2016   ALPRAZolam  (XANAX ) 0.5 MG tablet TAKE 1/2 TABLET(0.25 MG) BY MOUTH TWICE DAILY AS NEEDED FOR ANXIETY   amiodarone  (PACERONE ) 200 MG tablet Take 1 tablet (200 mg total) by mouth daily.   anastrozole  (ARIMIDEX ) 1 MG tablet Take 1 mg by mouth daily.   b complex vitamins capsule Take 1 capsule by mouth daily.   BIOTIN PO Take 1 tablet by mouth 2 (two) times daily. For Hair, Skin and Nails   carboxymethylcellulose (REFRESH PLUS) 0.5 % SOLN Place 1 drop into both eyes as needed.   cycloSPORINE (RESTASIS) 0.05 % ophthalmic emulsion Place 1 drop into both eyes as needed.   diltiazem  (CARDIZEM  CD) 120 MG 24 hr capsule TAKE 1 CAPSULE(120 MG) BY MOUTH DAILY   ELIQUIS  5 MG TABS tablet TAKE 1 TABLET TWICE A DAY   fexofenadine (ALLEGRA) 180 MG tablet Take 180 mg by mouth daily as needed (seasonal allergies).   Glucosamine HCl-MSM (GLUCOSAMINE-MSM PO) Take 1 tablet by mouth 2 (two) times daily.   metoprolol  tartrate (  LOPRESSOR ) 25 MG tablet Take 1 tablet (25 mg total) by mouth daily as needed.   Multiple Vitamins-Minerals (CITRACAL +D3 PO) Take 1 tablet by mouth in the morning and at bedtime.   Multiple Vitamins-Minerals (PRESERVISION AREDS PO) Take 1 tablet by mouth 2 (two) times daily. # 2   phenazopyridine  (PYRIDIUM ) 100 MG tablet Take 1 tablet (100 mg total) by mouth 3 (three) times daily as needed for pain.   Polyethyl Glycol-Propyl Glycol 0.4-0.3 % SOLN Place 2 drops into both eyes daily.   prednisoLONE acetate (PRED FORTE) 1 % ophthalmic suspension She is only  using once daily   sodium chloride  (OCEAN) 0.65 % nasal spray Place 1 spray into the nose 2 (two) times daily as needed for congestion. Reported on 01/19/2016   venlafaxine  XR (EFFEXOR  XR) 37.5 MG 24 hr capsule Take 1 capsule (37.5 mg total) by mouth daily with breakfast.   VEOZAH  45 MG TABS TAKE 1 TABLET DAILY AT 12 NOON   sulfamethoxazole -trimethoprim  (BACTRIM  DS) 800-160 MG tablet Take 1 tablet by mouth 2 (two) times daily. (Patient not taking: Reported on 06/10/2024)   No facility-administered encounter medications on file as of 06/10/2024.    Allergies (verified) Codeine and Gabapentin    History: Past Medical History:  Diagnosis Date   Acne rosacea 04/14/2021   Allergy    Anxiety    Arrhythmia    atrial fibrillation/  on    Arthritis    Atrial fibrillation (HCC)    Breast cancer (HCC) 11/09/2013   right upper outer   Contact lens/glasses fitting    contact rt eye   Coronary artery calcification 03/04/2023   CCTA 03/04/23: CAC score 27.2; total plaque volume is mild (8th percentile); min plaque LAD (<25)   Hx of radiation therapy 12/15/13- 01/11/14   right breast 4250 cGy in 17 sessions, (hypo-fractionated), no boost   Hypertension    Mitral regurgitation 03/15/2023   TTE 03/15/2023: EF 55-60, no RWMA, GLS -20.8, normal RVSF, normal PASP, RVSP 35.5, mild MR, mild AI, AV sclerosis, aortic root 4 cm, ascending aorta 3.7 cm (normal range), RAP 8   Postmenopausal    Rosacea    Seasonal allergies    Sleep disturbance    Thyroid  disease    Urinary incontinence    Past Surgical History:  Procedure Laterality Date   ABDOMINAL HYSTERECTOMY  1973   endometriosis   APPENDECTOMY  1954   BACK SURGERY     lumb-2005   BILATERAL OOPHORECTOMY     benign tumor on ovary   BREAST LUMPECTOMY WITH NEEDLE LOCALIZATION Right 11/09/2013   Procedure: BREAST LUMPECTOMY WITH NEEDLE LOCALIZATION;  Surgeon: Vicenta DELENA Poli, MD;  Location: [facility];  Service: General;  Laterality:  Right;   CATARACT EXTRACTION  2013   both   CYSTOCELE REPAIR  2008   EYE SURGERY     cataracts-plugs for eyes   RECTOCELE REPAIR  2008   TONSILLECTOMY     Family History  Problem Relation Age of Onset   Alzheimer's disease Mother    Stroke Father    Heart attack Neg Hx    Social History   Socioeconomic History   Marital status: Married    Spouse name: Not on file   Number of children: 3   Years of education: Not on file   Highest education level: Not on file  Occupational History   Not on file  Tobacco Use   Smoking status: Never   Smokeless tobacco: Never  Vaping  Use   Vaping status: Never Used  Substance and Sexual Activity   Alcohol use: Yes    Comment: wine once a month   Drug use: No   Sexual activity: Never    Comment: menarche age 81, P64, first live birth age 89,  HRT x 30 years  Other Topics Concern   Not on file  Social History Narrative   2nd marriage.   First husband is deceased.   7 grandchildren   7 great grandchildren   Social Drivers of Corporate investment banker Strain: Low Risk  (06/10/2024)   Overall Financial Resource Strain (CARDIA)    Difficulty of Paying Living Expenses: Not hard at all  Food Insecurity: No Food Insecurity (06/10/2024)   Hunger Vital Sign    Worried About Running Out of Food in the Last Year: Never true    Ran Out of Food in the Last Year: Never true  Transportation Needs: No Transportation Needs (06/10/2024)   PRAPARE - Administrator, Civil Service (Medical): No    Lack of Transportation (Non-Medical): No  Physical Activity: Sufficiently Active (06/10/2024)   Exercise Vital Sign    Days of Exercise per Week: 3 days    Minutes of Exercise per Session: 60 min  Stress: No Stress Concern Present (06/10/2024)   [employer] of Occupational Health - Occupational Stress Questionnaire    Feeling of Stress: Only a little  Social Connections: Unknown (06/10/2024)   Social Connection and Isolation Panel    Frequency  of Communication with Friends and Family: More than three times a week    Frequency of Social Gatherings with Friends and Family: Three times a week    Attends Religious Services: Patient declined    Active Member of Clubs or Organizations: Patient declined    Attends Banker Meetings: Patient declined    Marital Status: Married    Tobacco Counseling Counseling given: Not Answered    Clinical Intake:  Pre-visit preparation completed: Yes  Pain : No/denies pain     Diabetes: No  No results found for: HGBA1C   How often do you need to have someone help you when you read instructions, pamphlets, or other written materials from your doctor or pharmacy?: 1 - Never  Interpreter Needed?: No  Information entered by :: Charmaine Bloodgood LPN   Activities of Daily Living     06/10/2024    1:45 PM  In your present state of health, do you have any difficulty performing the following activities:  Hearing? 0  Vision? 0  Difficulty concentrating or making decisions? 0  Walking or climbing stairs? 0  Dressing or bathing? 0  Doing errands, shopping? 0  Preparing Food and eating ? N  Using the Toilet? N  In the past six months, have you accidently leaked urine? N  Do you have problems with loss of bowel control? N  Managing your Medications? N  Managing your Finances? N  Housekeeping or managing your Housekeeping? N    Patient Care Team: Thedora Garnette HERO, MD as PCP - General (Family Medicine) Hobart Powell BRAVO, MD (Inactive) as PCP - Cardiology (Cardiology) Magrinat, Sandria BROCKS, MD (Inactive) as Consulting Physician (Oncology) Vernetta Berg, MD as Consulting Physician (General Surgery) Trudy Willma Hamel, MD as Referring Physician (Ophthalmology) Claudene Victory ORN, MD (Inactive) as Consulting Physician (Cardiology) Octavia Bruckner, MD as Consulting Physician (Ophthalmology)  I have updated your Care Teams any recent Medical Services you may have received  from other providers  in the past year.     Assessment:   This is a routine wellness examination for [place].  Hearing/Vision screen Hearing Screening - Comments:: Denies hearing difficulties   Vision Screening - Comments:: Wears rx glasses - up to date with routine eye exams with [facility]    Goals Addressed             This Visit's Progress    Remain active and independent   On track      Depression Screen     06/10/2024    1:42 PM 11/11/2023    1:55 PM 11/04/2023   10:55 AM 08/20/2023   10:47 AM 05/20/2023   10:22 AM 12/27/2022   10:30 AM 05/15/2022   11:09 AM  PHQ 2/9 Scores  PHQ - 2 Score 0 0 0 0 0 0 0    Fall Risk     06/10/2024    1:45 PM 11/11/2023    1:54 PM 11/04/2023   10:55 AM 08/20/2023   10:46 AM 05/20/2023   10:21 AM  Fall Risk   Falls in the past year? 0 1 0 1 1  Comment     tripped going to bathroom middle of the night  Number falls in past yr: 0 0 0 0 0  Injury with Fall? 0 1 0 0 0  Risk for fall due to : No Fall Risks Impaired balance/gait No Fall Risks History of fall(s) Medication side effect  Follow up Falls prevention discussed;Education provided;Falls evaluation completed Falls prevention discussed Falls evaluation completed Falls evaluation completed Falls prevention discussed;Education provided;Falls evaluation completed    MEDICARE RISK AT HOME:  Medicare Risk at Home Any stairs in or around the home?: No If so, are there any without handrails?: No Home free of loose throw rugs in walkways, pet beds, electrical cords, etc?: Yes Adequate lighting in your home to reduce risk of falls?: Yes Life alert?: No Use of a cane, walker or w/c?: No Grab bars in the bathroom?: Yes Shower chair or bench in shower?: No Elevated toilet seat or a handicapped toilet?: Yes  TIMED UP AND GO:  Was the test performed?  No  Cognitive Function: 6CIT completed    05/23/2023    9:40 AM  MMSE - Mini Mental State Exam  Orientation to time 4  Orientation  to Place 5  Registration 3  Attention/ Calculation 4  Recall 2  Language- name 2 objects 2  Language- repeat 1  Language- follow 3 step command 3  Language- read & follow direction 1  Write a sentence 1  Copy design 1  Total score 27        06/10/2024    1:45 PM 05/20/2023   10:25 AM  6CIT Screen  What Year? 0 points 4 points  What month? 0 points 0 points  What time? 0 points 0 points  Count back from 20 0 points 2 points  Months in reverse 2 points 0 points  Repeat phrase 2 points 0 points  Total Score 4 points 6 points    Immunizations Immunization History  Administered Date(s) Administered   Fluad Quad(high Dose 65+) 08/16/2021   Influenza,inj,Quad PF,6+ Mos 10/12/2015   Influenza-Unspecified 09/10/2022   Moderna Covid-19 Vaccine  Bivalent Booster 59yrs & up 03/22/2021, 08/24/2021   PFIZER(Purple Top)SARS-COV-2 Vaccination 12/23/2019, 01/13/2020, 08/29/2020, 10/20/2020, 03/22/2021   Pfizer(Comirnaty)Fall Seasonal Vaccine 12 years and older 10/03/2022   Pneumococcal Conjugate-13 10/05/2014   Pneumococcal Polysaccharide-23 08/22/2009   Respiratory Syncytial Virus Vaccine,Recomb Aduvanted(Arexvy)  08/09/2022   Tdap 09/13/2006, 01/05/2019   Zoster Recombinant(Shingrix) 09/02/2018, 01/23/2019   Zoster, Live 09/13/2006, 09/02/2018, 01/23/2019    Screening Tests Health Maintenance  Topic Date Due   COVID-19 Vaccine (9 - 2024-25 season) 08/04/2023   INFLUENZA VACCINE  07/03/2024   Medicare Annual Wellness (AWV)  06/10/2025   DTaP/Tdap/Td (3 - Td or Tdap) 01/05/2029   Pneumococcal Vaccine: 50+ Years  Completed   DEXA SCAN  Completed   Zoster Vaccines- Shingrix  Completed   Hepatitis B Vaccines  Aged Out   HPV VACCINES  Aged Out   Meningococcal B Vaccine  Aged Out    Health Maintenance  Health Maintenance Due  Topic Date Due   COVID-19 Vaccine (9 - 2024-25 season) 08/04/2023    Additional Screening:  Vision Screening: Recommended annual ophthalmology exams for  early detection of glaucoma and other disorders of the eye. Would you like a referral to an eye doctor? No    Dental Screening: Recommended annual dental exams for proper oral hygiene  Community Resource Referral / Chronic Care Management: CRR required this visit?  No   CCM required this visit?  No   Plan:    I have personally reviewed and noted the following in the patient's chart:   Medical and social history Use of alcohol, tobacco or illicit drugs  Current medications and supplements including opioid prescriptions. Patient is not currently taking opioid prescriptions. Functional ability and status Nutritional status Physical activity Advanced directives List of other physicians Hospitalizations, surgeries, and ER visits in previous 12 months Vitals Screenings to include cognitive, depression, and falls Referrals and appointments  In addition, I have reviewed and discussed with patient certain preventive protocols, quality metrics, and best practice recommendations. A written personalized care plan for preventive services as well as general preventive health recommendations were provided to patient.   Lavelle Pfeiffer [place], [place]   [id number]   After Visit Summary: (MyChart) Due to this being a telephonic visit, the after visit summary with patients personalized plan was offered to patient via MyChart   Notes: Nothing significant to report at this time.

## 2024-06-16 DIAGNOSIS — H16141 Punctate keratitis, right eye: Secondary | ICD-10-CM | POA: Diagnosis not present

## 2024-06-16 DIAGNOSIS — H0102A Squamous blepharitis right eye, upper and lower eyelids: Secondary | ICD-10-CM | POA: Diagnosis not present

## 2024-06-16 DIAGNOSIS — H26492 Other secondary cataract, left eye: Secondary | ICD-10-CM | POA: Diagnosis not present

## 2024-06-16 DIAGNOSIS — H0102B Squamous blepharitis left eye, upper and lower eyelids: Secondary | ICD-10-CM | POA: Diagnosis not present

## 2024-06-16 DIAGNOSIS — H43813 Vitreous degeneration, bilateral: Secondary | ICD-10-CM | POA: Diagnosis not present

## 2024-06-16 DIAGNOSIS — H353131 Nonexudative age-related macular degeneration, bilateral, early dry stage: Secondary | ICD-10-CM | POA: Diagnosis not present

## 2024-06-16 DIAGNOSIS — H40023 Open angle with borderline findings, high risk, bilateral: Secondary | ICD-10-CM | POA: Diagnosis not present

## 2024-06-17 ENCOUNTER — Ambulatory Visit (HOSPITAL_COMMUNITY): Admitting: Internal Medicine

## 2024-06-17 ENCOUNTER — Ambulatory Visit (HOSPITAL_COMMUNITY)
Admission: RE | Admit: 2024-06-17 | Discharge: 2024-06-17 | Disposition: A | Discharge status: Home / Self Care | Source: Ambulatory Visit | Attending: Internal Medicine | Admitting: Internal Medicine

## 2024-06-17 VITALS — BP 144/62 | HR 54 | Ht 65.5 in | Wt 161.2 lb

## 2024-06-17 DIAGNOSIS — I4891 Unspecified atrial fibrillation: Secondary | ICD-10-CM | POA: Insufficient documentation

## 2024-06-17 DIAGNOSIS — D6869 Other thrombophilia: Secondary | ICD-10-CM | POA: Insufficient documentation

## 2024-06-17 DIAGNOSIS — Z5181 Encounter for therapeutic drug level monitoring: Secondary | ICD-10-CM | POA: Insufficient documentation

## 2024-06-17 DIAGNOSIS — Z79899 Other long term (current) drug therapy: Secondary | ICD-10-CM | POA: Insufficient documentation

## 2024-06-17 DIAGNOSIS — I480 Paroxysmal atrial fibrillation: Secondary | ICD-10-CM | POA: Diagnosis not present

## 2024-06-17 NOTE — Progress Notes (Signed)
 Primary Care Physician: Thedora Garnette HERO, MD Referring Physician:ED f/u Cardiology: Dr. Claudene Alan RAMAN Katherine Powell is a 89 y.o. female with a h/o HTN and afib on flecainide  that is in the afib clinic as a f/u from the ED. She went into afib in the setting of illness. Prior to going to the ED , she had taken 3 extra  100 mg flecainide  one day and 200 mg extra flecainide  the next day. She was felt to have CAP and was started on Levaquin  and rates were slowed down to d/c home. Cardizem  was increased to 180 mg daily.  She was asked to follow up in the afib clinic. She has a CHADSVASC score of 4.   Today, she is in SR. She is feeling improved. We discussed how to treat afib if it reoccurs and taking extra flecainide  is not recommended and can be dangerous.   On follow up 02/18/24, she is currently in NSR. Patient called office 3/17 noting she has had 3 episodes of Afib this month which is more frequent than previous burden. She took metoprolol  last night. She takes flecainide  100 mg BID and diltiazem  180 mg daily. No missed doses of Eliquis  5 mg BID.   On follow up 03/10/24, she is currently in NSR. She transitioned from flecainide  to amiodarone  at last office visit. I lowered diltiazem  to 120 mg daily to increase heart rate. She transitioned to amiodarone  200 mg once daily and did notice an episode of Afib yesterday morning which lasted 2-3 hours. The episode resolved after she took a nap. No missed doses of Eliquis  5 mg BID.   On follow up 06/17/24, patient is here for amiodarone  surveillance. She is currently in NSR. She takes amiodarone  200 mg daily. She has had ~3 episodes of Afib since last office visit. No missed bleeding issues on Eliquis  5 mg BID.   Today, she denies symptoms of palpitations, chest pain, shortness of breath, orthopnea, PND, lower extremity edema, dizziness, presyncope, syncope, or neurologic sequela. The patient is tolerating medications without difficulties and is otherwise  without complaint today.   Past Medical History:  Diagnosis Date   Acne rosacea 04/14/2021   Allergy    Anxiety    Arrhythmia    atrial fibrillation/  on    Arthritis    Atrial fibrillation (HCC)    Breast cancer (HCC) 11/09/2013   right upper outer   Contact lens/glasses fitting    contact rt eye   Coronary artery calcification 03/04/2023   CCTA 03/04/23: CAC score 27.2; total plaque volume is mild (8th percentile); min plaque LAD (<25)   Hx of radiation therapy 12/15/13- 01/11/14   right breast 4250 cGy in 17 sessions, (hypo-fractionated), no boost   Hypertension    Mitral regurgitation 03/15/2023   TTE 03/15/2023: EF 55-60, no RWMA, GLS -20.8, normal RVSF, normal PASP, RVSP 35.5, mild MR, mild AI, AV sclerosis, aortic root 4 cm, ascending aorta 3.7 cm (normal range), RAP 8   Postmenopausal    Rosacea    Seasonal allergies    Sleep disturbance    Thyroid  disease    Urinary incontinence    Past Surgical History:  Procedure Laterality Date   ABDOMINAL HYSTERECTOMY  1973   endometriosis   APPENDECTOMY  1954   BACK SURGERY     lumb-2005   BILATERAL OOPHORECTOMY     benign tumor on ovary   BREAST LUMPECTOMY WITH NEEDLE LOCALIZATION Right 11/09/2013   Procedure: BREAST LUMPECTOMY WITH NEEDLE  LOCALIZATION;  Surgeon: Vicenta DELENA Poli, MD;  Location: [facility];  Service: General;  Laterality: Right;   CATARACT EXTRACTION  2013   both   CYSTOCELE REPAIR  2008   EYE SURGERY     cataracts-plugs for eyes   RECTOCELE REPAIR  2008   TONSILLECTOMY      Current Outpatient Medications  Medication Sig Dispense Refill   acetaminophen  (TYLENOL ) 650 MG CR tablet Take 650 mg by mouth as needed for pain. Reported on 01/19/2016     ALPRAZolam  (XANAX ) 0.5 MG tablet TAKE 1/2 TABLET(0.25 MG) BY MOUTH TWICE DAILY AS NEEDED FOR ANXIETY 30 tablet 2   amiodarone  (PACERONE ) 200 MG tablet Take 1 tablet (200 mg total) by mouth daily. 30 tablet 3   anastrozole  (ARIMIDEX ) 1 MG tablet  Take 1 mg by mouth daily.     b complex vitamins capsule Take 1 capsule by mouth daily.     BIOTIN PO Take 1 tablet by mouth 2 (two) times daily. For Hair, Skin and Nails     carboxymethylcellulose (REFRESH PLUS) 0.5 % SOLN Place 1 drop into both eyes as needed.     cycloSPORINE (RESTASIS) 0.05 % ophthalmic emulsion Place 1 drop into both eyes as needed.     diltiazem  (CARDIZEM  CD) 120 MG 24 hr capsule TAKE 1 CAPSULE(120 MG) BY MOUTH DAILY 90 capsule 3   ELIQUIS  5 MG TABS tablet TAKE 1 TABLET TWICE A DAY 180 tablet 3   fexofenadine (ALLEGRA) 180 MG tablet Take 180 mg by mouth daily as needed (seasonal allergies).     Glucosamine HCl-MSM (GLUCOSAMINE-MSM PO) Take 1 tablet by mouth 2 (two) times daily.     metoprolol  tartrate (LOPRESSOR ) 25 MG tablet Take 1 tablet (25 mg total) by mouth daily as needed. 90 tablet 1   Multiple Vitamins-Minerals (CITRACAL +D3 PO) Take 1 tablet by mouth in the morning and at bedtime.     Multiple Vitamins-Minerals (PRESERVISION AREDS PO) Take 1 tablet by mouth 2 (two) times daily. # 2     phenazopyridine  (PYRIDIUM ) 100 MG tablet Take 1 tablet (100 mg total) by mouth 3 (three) times daily as needed for pain. 10 tablet 0   Polyethyl Glycol-Propyl Glycol 0.4-0.3 % SOLN Place 2 drops into both eyes daily.     prednisoLONE acetate (PRED FORTE) 1 % ophthalmic suspension She is only using once daily     sodium chloride  (OCEAN) 0.65 % nasal spray Place 1 spray into the nose 2 (two) times daily as needed for congestion. Reported on 01/19/2016     sulfamethoxazole -trimethoprim  (BACTRIM  DS) 800-160 MG tablet Take 1 tablet by mouth 2 (two) times daily. (Patient not taking: Reported on 06/10/2024) 6 tablet 0   venlafaxine  XR (EFFEXOR  XR) 37.5 MG 24 hr capsule Take 1 capsule (37.5 mg total) by mouth daily with breakfast. 90 capsule 3   VEOZAH  45 MG TABS TAKE 1 TABLET DAILY AT 12 NOON 90 tablet 3   No current facility-administered medications for this visit.    Allergies  Allergen  Reactions   Codeine Nausea Only and Other (See Comments)   Gabapentin  Other (See Comments)    Makes her very sleepy   ROS- All systems are reviewed and negative except as per the HPI above  Physical Exam: There were no vitals filed for this visit.    Wt Readings from Last 3 Encounters:  06/10/24 72.1 kg  03/23/24 72.2 kg  03/10/24 74 kg    Labs: Lab Results  Component  Value Date   NA 139 02/13/2023   K 4.7 02/13/2023   CL 102 02/13/2023   CO2 23 02/13/2023   GLUCOSE 90 02/13/2023   BUN 27 02/13/2023   CREATININE 1.05 (H) 02/13/2023   CALCIUM 9.4 02/13/2023   MG 2.1 01/11/2022   Lab Results  Component Value Date   INR 1.05 11/09/2013   No results found for: CHOL, HDL, LDLCALC, TRIG  GEN- The patient is well appearing, alert and oriented x 3 today.   Neck - no JVD or carotid bruit noted Lungs- Clear to ausculation bilaterally, normal work of breathing Heart- Regular bradycardic rate and rhythm, no murmurs, rubs or gallops, PMI not laterally displaced Extremities- no clubbing, cyanosis, or edema Skin - no rash or ecchymosis noted   EKG- Vent. rate 54 BPM PR interval 164 ms QRS duration 94 ms QT/QTcB 442/419 ms P-R-T axes 70 19 76 Sinus bradycardia Low voltage QRS Borderline ECG When compared with ECG of 10-Mar-2024 14:28, No significant change was found  Echo 03/15/23: 1. Left ventricular ejection fraction, by estimation, is 55 to 60%. The  left ventricle has normal function. The left ventricle has no regional  wall motion abnormalities. Left ventricular diastolic parameters are  indeterminate. Elevated left ventricular  end-diastolic pressure. The average left ventricular global longitudinal  strain is -20.8 %. The global longitudinal strain is normal.   2. Right ventricular systolic function is normal. The right ventricular  size is normal. There is normal pulmonary artery systolic pressure. The  estimated right ventricular systolic pressure is  35.5 mmHg.   3. The mitral valve is normal in structure. Mild mitral valve  regurgitation. No evidence of mitral stenosis.   4. The aortic valve is tricuspid. Aortic valve regurgitation is mild.  Aortic valve sclerosis/calcification is present, without any evidence of  aortic stenosis.   5. The aortic root measures 4cm and ascending aorta 3.7cm but when  indexed for age and gender, these dimensions are within normal range.   6. The inferior vena cava is normal in size with <50% respiratory  variability, suggesting right atrial pressure of 8 mmHg.   CHA2DS2-VASc Score = 4  The patient's score is based upon: CHF History: 0 HTN History: 1 Diabetes History: 0 Stroke History: 0 Vascular Disease History: 0 Age Score: 2 Gender Score: 1       ASSESSMENT AND PLAN: Paroxysmal Atrial Fibrillation (ICD10:  I48.0) The patient's CHA2DS2-VASc score is 4, indicating a 4.8% annual risk of stroke.    She is currently in NSR. She is happy with current management overall.   High risk medication monitoring (ICD10: J342684) Patient requires ongoing monitoring for anti-arrhythmic medication which has the potential to cause life threatening arrhythmias or AV block. Qtc stable. Continue amiodarone  200 mg daily. Cmet and TSH drawn today.  Secondary Hypercoagulable State (ICD10:  D68.69) The patient is at significant risk for stroke/thromboembolism based upon her CHA2DS2-VASc Score of 4.  Continue Apixaban  (Eliquis ).  No missed doses.      Follow up 6 months for amiodarone  surveillance.  Dorn Heinrich, PA-C Afib Clinic [facility] [address] [place], [place] [address] [phone]

## 2024-06-18 ENCOUNTER — Ambulatory Visit (HOSPITAL_COMMUNITY): Payer: Self-pay | Admitting: Internal Medicine

## 2024-06-18 LAB — COMPREHENSIVE METABOLIC PANEL WITH GFR
ALT: 15 IU/L (ref 0–32)
AST: 21 IU/L (ref 0–40)
Albumin: 4.4 g/dL (ref 3.7–4.7)
Alkaline Phosphatase: 91 IU/L (ref 44–121)
BUN/Creatinine Ratio: 21 (ref 12–28)
BUN: 25 mg/dL (ref 8–27)
Bilirubin Total: 0.4 mg/dL (ref 0.0–1.2)
CO2: 23 mmol/L (ref 20–29)
Calcium: 9.7 mg/dL (ref 8.7–10.3)
Chloride: 100 mmol/L (ref 96–106)
Creatinine, Ser: 1.2 mg/dL — ABNORMAL HIGH (ref 0.57–1.00)
Globulin, Total: 2.1 g/dL (ref 1.5–4.5)
Glucose: 87 mg/dL (ref 70–99)
Potassium: 4.9 mmol/L (ref 3.5–5.2)
Sodium: 137 mmol/L (ref 134–144)
Total Protein: 6.5 g/dL (ref 6.0–8.5)
eGFR: 43 mL/min/1.73 — ABNORMAL LOW (ref >59)

## 2024-06-18 LAB — TSH: TSH: 0.949 u[IU]/mL (ref 0.450–4.500)

## 2024-06-26 DIAGNOSIS — Z961 Presence of intraocular lens: Secondary | ICD-10-CM | POA: Diagnosis not present

## 2024-06-26 DIAGNOSIS — H353133 Nonexudative age-related macular degeneration, bilateral, advanced atrophic without subfoveal involvement: Secondary | ICD-10-CM | POA: Diagnosis not present

## 2024-06-26 DIAGNOSIS — H35033 Hypertensive retinopathy, bilateral: Secondary | ICD-10-CM | POA: Diagnosis not present

## 2024-06-26 DIAGNOSIS — H1789 Other corneal scars and opacities: Secondary | ICD-10-CM | POA: Diagnosis not present

## 2024-06-26 DIAGNOSIS — H353123 Nonexudative age-related macular degeneration, left eye, advanced atrophic without subfoveal involvement: Secondary | ICD-10-CM | POA: Diagnosis not present

## 2024-07-01 DIAGNOSIS — H3530 Unspecified macular degeneration: Secondary | ICD-10-CM | POA: Diagnosis not present

## 2024-07-01 DIAGNOSIS — Z947 Corneal transplant status: Secondary | ICD-10-CM | POA: Diagnosis not present

## 2024-07-24 ENCOUNTER — Other Ambulatory Visit: Payer: Self-pay | Admitting: Family Medicine

## 2024-07-24 DIAGNOSIS — H353123 Nonexudative age-related macular degeneration, left eye, advanced atrophic without subfoveal involvement: Secondary | ICD-10-CM | POA: Diagnosis not present

## 2024-07-24 DIAGNOSIS — N951 Menopausal and female climacteric states: Secondary | ICD-10-CM

## 2024-07-24 DIAGNOSIS — H353133 Nonexudative age-related macular degeneration, bilateral, advanced atrophic without subfoveal involvement: Secondary | ICD-10-CM | POA: Diagnosis not present

## 2024-07-24 NOTE — Telephone Encounter (Signed)
 Copied from CRM [id number]. Topic: Clinical - Medication Refill >> Jul 24, 2024  4:07 PM Lavanda D wrote: Medication: VEOZAH  45 MG TABS  Has the patient contacted their pharmacy? Yes, had not heard back (Agent: If no, request that the patient contact the pharmacy for the refill. If patient does not wish to contact the pharmacy document the reason why and proceed with request.) (Agent: If yes, when and what did the pharmacy advise?)  This is the patient's preferred pharmacy:  [facility] - [place], [place] - [address] [place] [address] Phone: [phone] Fax: [phone]  Is this the correct pharmacy for this prescription? Yes If no, delete pharmacy and type the correct one.   Has the prescription been filled recently? No  Is the patient out of the medication? Yes  Has the patient been seen for an appointment in the last year OR does the patient have an upcoming appointment? Yes  Can we respond through MyChart? Yes  Agent: Please be advised that Rx refills may take up to 3 business days. We ask that you follow-up with your pharmacy.

## 2024-07-30 ENCOUNTER — Telehealth: Payer: Self-pay | Admitting: Family Medicine

## 2024-07-30 ENCOUNTER — Telehealth: Payer: Self-pay

## 2024-07-30 DIAGNOSIS — N951 Menopausal and female climacteric states: Secondary | ICD-10-CM

## 2024-07-30 MED ORDER — VEOZAH 45 MG PO TABS: 45.0000 mg | ORAL_TABLET | Freq: Every day | ORAL | 3 refills | Status: AC

## 2024-07-30 NOTE — Telephone Encounter (Signed)
 Copied from CRM #[id number]. Topic: General - Call Back - No Documentation >> Jul 30, 2024 12:01 PM Shereese L wrote: Reason for CRM: patient husband returned nurse call and was advised that the nurse is on lunch and would like a call back

## 2024-07-30 NOTE — Telephone Encounter (Signed)
Left VM to rtn call. Dm/cma       

## 2024-07-30 NOTE — Telephone Encounter (Signed)
 Pharmacy Patient Advocate Encounter   Received notification from CoverMyMeds that prior authorization for Veozah  45MG  tablets is required/requested.   Insurance verification completed.   The patient is insured through [employer] .   Per test claim: PA required; PA submitted to above mentioned insurance via Latent Key/confirmation #/EOC [id number] Status is pending

## 2024-07-30 NOTE — Addendum Note (Signed)
 Addended by: KYM KARNA CROME on: 07/30/2024 12:06 PM   Modules accepted: Orders

## 2024-07-30 NOTE — Telephone Encounter (Signed)
 Prescription Request  07/30/2024  LOV: 03/23/2024  What is the name of the medication or equipment? VEOZAH  45 MG TABS [[id number]]   Have you contacted your pharmacy to request a refill? Yes, she thought it had been filled she has One pill left  Which pharmacy would you like this sent to?  [id number] - [place], [place] - [address] [facility] [address] [place] [address] Phone: [phone] Fax: [phone]      Patient notified that their request is being sent to the clinical staff for review and that they should receive a response within 2 business days.   Please advise at [facility] [phone]

## 2024-07-30 NOTE — Telephone Encounter (Signed)
 Rx sent to the [facility].  Patient notified VIA phone. Dm/cma

## 2024-07-31 NOTE — Telephone Encounter (Signed)
Left VM to RTN call.  Dm/cma

## 2024-07-31 NOTE — Telephone Encounter (Signed)
 RX needs a PA and advised that it will be done and it can tak up to 24- 72 hours.  Dm/cma

## 2024-08-04 NOTE — Telephone Encounter (Signed)
 Pharmacy Patient Advocate Encounter  Received notification from [facility] that Prior Authorization for Veozah  45MG  tablets  has been CaseId:[id number];Status:Approved;Review Type:Prior Auth;Coverage Start Date:07/01/2024;Coverage End Date:01/27/2025;    PA #/Case ID/Reference #: [id number]

## 2024-08-05 NOTE — Telephone Encounter (Signed)
 RX was approved see PA note. Dm/cma

## 2024-08-12 DIAGNOSIS — Z947 Corneal transplant status: Secondary | ICD-10-CM | POA: Diagnosis not present

## 2024-08-14 ENCOUNTER — Ambulatory Visit: Admitting: Family Medicine

## 2024-08-20 DIAGNOSIS — H16141 Punctate keratitis, right eye: Secondary | ICD-10-CM | POA: Diagnosis not present

## 2024-08-20 DIAGNOSIS — H0015 Chalazion left lower eyelid: Secondary | ICD-10-CM | POA: Diagnosis not present

## 2024-08-28 ENCOUNTER — Ambulatory Visit (INDEPENDENT_AMBULATORY_CARE_PROVIDER_SITE_OTHER): Admitting: Family Medicine

## 2024-08-28 ENCOUNTER — Encounter: Payer: Self-pay | Admitting: Family Medicine

## 2024-08-28 VITALS — BP 130/68 | HR 63 | Temp 97.6°F | Ht 65.5 in | Wt 158.8 lb

## 2024-08-28 DIAGNOSIS — I10 Essential (primary) hypertension: Secondary | ICD-10-CM

## 2024-08-28 DIAGNOSIS — H00015 Hordeolum externum left lower eyelid: Secondary | ICD-10-CM | POA: Diagnosis not present

## 2024-08-28 DIAGNOSIS — R053 Chronic cough: Secondary | ICD-10-CM | POA: Diagnosis not present

## 2024-08-28 DIAGNOSIS — E042 Nontoxic multinodular goiter: Secondary | ICD-10-CM

## 2024-08-28 DIAGNOSIS — I480 Paroxysmal atrial fibrillation: Secondary | ICD-10-CM

## 2024-08-28 MED ORDER — ERYTHROMYCIN 5 MG/GM OP OINT: 1.0000 | TOPICAL_OINTMENT | Freq: Every day | OPHTHALMIC | 0 refills | Status: AC

## 2024-08-28 MED ORDER — APIXABAN 2.5 MG PO TABS
2.5000 mg | ORAL_TABLET | Freq: Two times a day (BID) | ORAL | 3 refills | Status: AC
Start: 2024-08-28 — End: ?

## 2024-08-28 NOTE — Assessment & Plan Note (Signed)
 Stable. Rate well controlled. Continue metoprolol  tartrate 25 mg bid and amiodarone  200 mg daily. We discussed consideration of stopping her anticoagulation due to concerns about bruising. We agreed to reduce her dose of apixaban  (Eliquis ) to 2.5 mg bid.

## 2024-08-28 NOTE — Progress Notes (Signed)
 [facility] [address] [place] [address] Dept: [phone] Dept Fax: [phone]  Chronic Care Office Visit  Subjective:    Patient ID: Katherine Powell, female    DOB: 1932-11-14, 89 y.o..   MRN: [id number]  Chief Complaint  Patient presents with   Hypertension    6 month f/u HTN.  C/o having a spot on LT eye.   Declines flu shot.    History of Present Illness:  Patient is in today for reassessment of chronic medical issues.  Katherine Powell has a history of hypertension. She is managed on diltiazem  CD 180 mg daily. She also has moderate CKD.    Katherine Powell has a history of atrial fibrillation. She is managed on metoprolol  tartrate 25 mg bid for rate control and amiodarone  200 mg daily for rhythm control. She is on Eliquis  5 mg bid for anticoagulation. She has not been happy with needing to use this. She feels like she gets a skin reaction related to this.  Katherine Powell has previously had identified a multinodular goiter. Several years ago, she had the sensation that this was pressing on her throat. More recently, she has had issue with a episodic coughing. She is worried that the thryoid is inducing this somehow.  Katherine Powell notes that she has had a bump in her left lower eyelid for the past month. She has been hot packing this. Although it improved for a while, it is now back to being crusted.  Past Medical History: Patient Active Problem List   Diagnosis Date Noted   Hypercoagulable state due to paroxysmal atrial fibrillation (HCC) 02/18/2024   Mild cognitive impairment 05/23/2023   Vasomotor symptoms due to menopause 04/17/2023   Mitral regurgitation 03/15/2023   Coronary artery calcification 03/04/2023   Precordial chest pain 02/13/2023   Arthropathy of lumbar facet joint 02/13/2022   Osteoarthritis of both hands 08/16/2021   Encounter for monitoring amiodarone  therapy 04/14/2021   History of colonic polyps  04/14/2021   Mixed incontinence 04/14/2021   History of cornea transplant 04/14/2021   Essential hypertension 04/14/2021   Degeneration of lumbar intervertebral disc 04/14/2021   Corneal transplant failure, right eye 04/14/2021   Chronic kidney disease, stage 3a (HCC) 04/14/2021   Anxiety 04/14/2021   Allergic rhinitis 04/14/2021   Presbycusis of both ears 04/14/2021   Red blood cell antibody positive 12/05/2018   Multinodular goiter (nontoxic) 02/11/2017   Spinal stenosis of lumbar region 10/31/2016   Degenerative spondylolisthesis 10/11/2016   Osteopenia 07/18/2016   Lumbar radiculopathy 01/05/2015   Paroxysmal atrial fibrillation (HCC) 12/29/2014   Malignant neoplasm of upper-outer quadrant of right breast in female, estrogen receptor positive (HCC) 11/17/2013   Past Surgical History:  Procedure Laterality Date   ABDOMINAL HYSTERECTOMY  1973   endometriosis   APPENDECTOMY  1954   BACK SURGERY     lumb-2005   BILATERAL OOPHORECTOMY     benign tumor on ovary   BREAST LUMPECTOMY WITH NEEDLE LOCALIZATION Right 11/09/2013   Procedure: BREAST LUMPECTOMY WITH NEEDLE LOCALIZATION;  Surgeon: Vicenta DELENA Poli, MD;  Location: [facility];  Service: General;  Laterality: Right;   CATARACT EXTRACTION  2013   both   CYSTOCELE REPAIR  2008   EYE SURGERY     cataracts-plugs for eyes   RECTOCELE REPAIR  2008   TONSILLECTOMY     Family History  Problem Relation Age of Onset   Alzheimer's disease Mother    Stroke Father  Heart attack Neg Hx    Outpatient Medications Prior to Visit  Medication Sig Dispense Refill   acetaminophen  (TYLENOL ) 650 MG CR tablet Take 650 mg by mouth as needed for pain. Reported on 01/19/2016     ALPRAZolam  (XANAX ) 0.5 MG tablet TAKE 1/2 TABLET(0.25 MG) BY MOUTH TWICE DAILY AS NEEDED FOR ANXIETY 30 tablet 2   amiodarone  (PACERONE ) 200 MG tablet Take 1 tablet (200 mg total) by mouth daily. 30 tablet 3   anastrozole  (ARIMIDEX ) 1 MG tablet Take 1  mg by mouth daily.     b complex vitamins capsule Take 1 capsule by mouth daily.     BIOTIN PO Take 1 tablet by mouth 2 (two) times daily. For Hair, Skin and Nails     carboxymethylcellulose (REFRESH PLUS) 0.5 % SOLN Place 1 drop into both eyes as needed.     cycloSPORINE (RESTASIS) 0.05 % ophthalmic emulsion Place 1 drop into both eyes as needed.     diltiazem  (CARDIZEM  CD) 120 MG 24 hr capsule TAKE 1 CAPSULE(120 MG) BY MOUTH DAILY 90 capsule 3   fexofenadine (ALLEGRA) 180 MG tablet Take 180 mg by mouth daily as needed (seasonal allergies).     Fezolinetant  (VEOZAH ) 45 MG TABS Take 1 tablet (45 mg total) by mouth daily. 90 tablet 3   Glucosamine HCl-MSM (GLUCOSAMINE-MSM PO) Take 1 tablet by mouth 2 (two) times daily.     metoprolol  tartrate (LOPRESSOR ) 25 MG tablet Take 1 tablet (25 mg total) by mouth daily as needed. 90 tablet 1   Multiple Vitamins-Minerals (CITRACAL +D3 PO) Take 1 tablet by mouth in the morning and at bedtime.     Multiple Vitamins-Minerals (PRESERVISION AREDS PO) Take 1 tablet by mouth 2 (two) times daily. # 2     Polyethyl Glycol-Propyl Glycol 0.4-0.3 % SOLN Place 2 drops into both eyes daily.     prednisoLONE acetate (PRED FORTE) 1 % ophthalmic suspension She is only using once daily     sodium chloride  (OCEAN) 0.65 % nasal spray Place 1 spray into the nose 2 (two) times daily as needed for congestion. Reported on 01/19/2016     venlafaxine  XR (EFFEXOR  XR) 37.5 MG 24 hr capsule Take 1 capsule (37.5 mg total) by mouth daily with breakfast. 90 capsule 3   ELIQUIS  5 MG TABS tablet TAKE 1 TABLET TWICE A DAY 180 tablet 3   No facility-administered medications prior to visit.   Allergies  Allergen Reactions   Codeine Nausea Only and Other (See Comments)   Gabapentin  Other (See Comments)    Makes her very sleepy   Objective:   Today's Vitals   08/28/24 0918  BP: 130/68  Pulse: 63  Temp: 97.6 F (36.4 C)  TempSrc: Temporal  SpO2: 95%  Weight: 158 lb 12.8 oz (72 kg)   Height: 5' 5.5 (1.664 m)   Body mass index is 26.02 kg/m.   General: Well developed, well nourished. No acute distress. HEENT: Normocephalic, non-traumatic. The left lower eyelid has an area of crusting and   swelling near the midpoint of the lash margin, with mild redness. Oropharynx clear. Good   dentition. Neck: Supple. No lymphadenopathy. Mild irregularity to the surface of the thyroid  gland. Psych: Alert and oriented. Normal mood and affect.  There are no preventive care reminders to display for this patient.    Assessment & Plan:   Problem List Items Addressed This Visit       Cardiovascular and Mediastinum   Paroxysmal atrial fibrillation (HCC) (Chronic)  Stable. Rate well controlled. Continue metoprolol  tartrate 25 mg bid and amiodarone  200 mg daily. We discussed consideration of stopping her anticoagulation due to concerns about bruising. We agreed to reduce her dose of apixaban  (Eliquis ) to 2.5 mg bid.      Relevant Medications   apixaban  (ELIQUIS ) 2.5 MG TABS tablet   Essential hypertension - Primary   Blood pressure is in good control. Continue diltiazem  CD 180 mg daily and metoprolol  tartrate 25 mg twice daily.       Relevant Medications   apixaban  (ELIQUIS ) 2.5 MG TABS tablet     Endocrine   Multinodular goiter (nontoxic)   I do not see any enlargement on physical exam to suggest this as a cause for her cough. However, she has an ongoing concern regarding this. I will refer her to an ENT to assess      Relevant Orders   Ambulatory referral to ENT   Other Visit Diagnoses       Hordeolum externum of left lower eyelid       Failed conservative management. I will prescribe erythromycin  ointment. Recommend she continue hot packs. Discussed use of baby shampoo to clean lid margin.   Relevant Medications   erythromycin  ophthalmic ointment     Chronic cough       Etiology unclear. Refer to ENT.   Relevant Orders   Ambulatory referral to ENT        Return in about 6 months (around 02/25/2025).   Garnette CHRISTELLA Simpler, MD

## 2024-08-28 NOTE — Assessment & Plan Note (Signed)
Blood pressure is in good control. Continue diltiazem CD 180 mg daily and metoprolol tartrate 25 mg twice daily.

## 2024-08-28 NOTE — Assessment & Plan Note (Signed)
 I do not see any enlargement on physical exam to suggest this as a cause for her cough. However, she has an ongoing concern regarding this. I will refer her to an ENT to assess

## 2024-09-03 DIAGNOSIS — Z947 Corneal transplant status: Secondary | ICD-10-CM | POA: Diagnosis not present

## 2024-09-03 DIAGNOSIS — H16141 Punctate keratitis, right eye: Secondary | ICD-10-CM | POA: Diagnosis not present

## 2024-09-03 DIAGNOSIS — H0015 Chalazion left lower eyelid: Secondary | ICD-10-CM | POA: Diagnosis not present

## 2024-09-03 DIAGNOSIS — H26492 Other secondary cataract, left eye: Secondary | ICD-10-CM | POA: Diagnosis not present

## 2024-09-03 DIAGNOSIS — H40023 Open angle with borderline findings, high risk, bilateral: Secondary | ICD-10-CM | POA: Diagnosis not present

## 2024-09-23 DIAGNOSIS — H1789 Other corneal scars and opacities: Secondary | ICD-10-CM | POA: Diagnosis not present

## 2024-09-23 DIAGNOSIS — H353123 Nonexudative age-related macular degeneration, left eye, advanced atrophic without subfoveal involvement: Secondary | ICD-10-CM | POA: Diagnosis not present

## 2024-09-23 DIAGNOSIS — Z961 Presence of intraocular lens: Secondary | ICD-10-CM | POA: Diagnosis not present

## 2024-09-23 DIAGNOSIS — H353133 Nonexudative age-related macular degeneration, bilateral, advanced atrophic without subfoveal involvement: Secondary | ICD-10-CM | POA: Diagnosis not present

## 2024-09-23 DIAGNOSIS — H35033 Hypertensive retinopathy, bilateral: Secondary | ICD-10-CM | POA: Diagnosis not present

## 2024-09-25 ENCOUNTER — Other Ambulatory Visit: Payer: Self-pay | Admitting: Family Medicine

## 2024-09-25 DIAGNOSIS — F419 Anxiety disorder, unspecified: Secondary | ICD-10-CM

## 2024-09-25 NOTE — Telephone Encounter (Signed)
 Refill request for  Alaprazplam 0.5 mg LR 01/27/24, #30, 2 rf LOV  08/28/24 FOV  02/25/25  Please review and advise.  Thanks.  Dm/cma

## 2024-10-05 ENCOUNTER — Ambulatory Visit (INDEPENDENT_AMBULATORY_CARE_PROVIDER_SITE_OTHER): Admitting: Emergency Medicine

## 2024-10-05 ENCOUNTER — Ambulatory Visit: Payer: Self-pay

## 2024-10-05 ENCOUNTER — Encounter: Payer: Self-pay | Admitting: Emergency Medicine

## 2024-10-05 VITALS — BP 120/78 | HR 67 | Resp 16 | Ht 65.5 in | Wt 165.0 lb

## 2024-10-05 DIAGNOSIS — T148XXA Other injury of unspecified body region, initial encounter: Secondary | ICD-10-CM | POA: Diagnosis not present

## 2024-10-05 DIAGNOSIS — L089 Local infection of the skin and subcutaneous tissue, unspecified: Secondary | ICD-10-CM

## 2024-10-05 MED ORDER — CEFADROXIL 500 MG PO CAPS: 500.0000 mg | ORAL_CAPSULE | Freq: Two times a day (BID) | ORAL | 0 refills | Status: AC

## 2024-10-05 NOTE — Telephone Encounter (Signed)
 FYI Only or Action Required?: FYI only for provider: appointment scheduled on today.  Patient was last seen in primary care on 08/28/2024 by Thedora Garnette HERO, MD.  Called Nurse Triage reporting Joint Swelling.  Symptoms began 3 days ago.  Interventions attempted: Nothing.  Symptoms are: unchanged.  Triage Disposition: See HCP Within 4 Hours (Or PCP Triage)  Patient/caregiver understands and will follow disposition?: Yes    Copied from CRM [id number]. Topic: Clinical - Red Word Triage >> Oct 05, 2024 10:22 AM Pinkey ORN wrote: Red Word that prompted transfer to Nurse Triage: Weeping Fluid In Right Ankle      Reason for Disposition  [1] Thigh, calf, or ankle swelling AND [2] only 1 side  (Exceptions: Itchy, localized swelling; swelling is chronic.)  Answer Assessment - Initial Assessment Questions 1. LOCATION: Which ankle is swollen? Where is the swelling?     Right ankle  2. ONSET: When did the swelling start?     3 days ago 3. SWELLING: How bad is the swelling? Or, How large is it? (e.g., mild, moderate, severe; size of localized swelling)      Mild 4. PAIN: Is there any pain? If Yes, ask: How bad is it? (Scale 0-10; or none, mild, moderate, severe)     No 5. CAUSE: What do you think caused the ankle swelling?     Unsure  6. OTHER SYMPTOMS: Do you have any other symptoms? (e.g., fever, chest pain, difficulty breathing, calf pain)     Weeping fluid  Protocols used: Ankle Swelling-A-AH

## 2024-10-05 NOTE — Progress Notes (Signed)
 Assessment & Plan:   Assessment & Plan Infected skin tear  Orders:   cefadroxil (DURICEF) 500 MG capsule; Take 1 capsule (500 mg total) by mouth 2 (two) times daily for 7 days.   Patient Instructions   VISIT SUMMARY: Today, you were seen for a new skin tear on your lower leg with some drainage and mild redness. We also discussed your ongoing issues with leg swelling and varicose veins.  YOUR PLAN: SKIN TEAR WITH DRAINAGE AND MILD REDNESS: You have a new skin tear on your lower leg that is draining and slightly red, likely due to mild swelling and a minor injury. -Apply a Tegaderm dressing to the area to prevent further oozing. -If the Tegaderm dressing is not enough, consider using Allevyn (or some other brand of foam dressings) for better absorption. -You have been prescribed an antibiotic to cover for any potential infection. -Elevate your leg to help reduce the swelling. -Watch for increased redness, pain, fever, or streaking, and seek urgent care if these occur.  PITTING EDEMA OF LOWER EXTREMITIES: You have mild swelling in your lower legs, likely due to venous insufficiency and fluid buildup. -Continue wearing your compression stockings. -Try to minimize the time you spend on your feet to help reduce the swelling and drainage.        Corean Geralds, MSPAS, PA-C    Subjective:  Ankle Injury (Right ankle weeping x 5 days. Noticed clear water like drainage. No pain or swelling. No falls or injury to ankle. )    HPI:  Discussed the use of AI scribe software for clinical note transcription with the patient, who gave verbal consent to proceed.  History of Present Illness Katherine Powell is a 89 year old female with a history of varicosities who presents with new onset of leg weeping.  The leg weeping began five days ago, initially noticed when her bedroom shoe became wet. There is no associated pain, fever, or history of similar episodes. She has not had any  recent injuries to the area. She has mild swelling in her legs, managed with compression stockings for years. She denies diabetes and generally feels well. She mentions recent exercise, which may have been excessive for her condition.     ROS: Negative unless specifically indicated above in HPI.   Relevant past medical history reviewed and updated as indicated.   Allergies and medications reviewed and updated.   Current Outpatient Medications:    acetaminophen  (TYLENOL ) 650 MG CR tablet, Take 650 mg by mouth as needed for pain. Reported on 01/19/2016, Disp: , Rfl:    ALPRAZolam  (XANAX ) 0.5 MG tablet, TAKE 1/2 TABLET(0.25 MG) BY MOUTH TWICE DAILY AS NEEDED FOR ANXIETY, Disp: 30 tablet, Rfl: 1   amiodarone  (PACERONE ) 200 MG tablet, Take 1 tablet (200 mg total) by mouth daily., Disp: 30 tablet, Rfl: 3   anastrozole  (ARIMIDEX ) 1 MG tablet, Take 1 mg by mouth daily., Disp: , Rfl:    apixaban  (ELIQUIS ) 2.5 MG TABS tablet, Take 1 tablet (2.5 mg total) by mouth 2 (two) times daily., Disp: 180 tablet, Rfl: 3   b complex vitamins capsule, Take 1 capsule by mouth daily., Disp: , Rfl:    BIOTIN PO, Take 1 tablet by mouth 2 (two) times daily. For Hair, Skin and Nails, Disp: , Rfl:    carboxymethylcellulose (REFRESH PLUS) 0.5 % SOLN, Place 1 drop into both eyes as needed., Disp: , Rfl:    cefadroxil (DURICEF) 500 MG capsule, Take 1 capsule (500  mg total) by mouth 2 (two) times daily for 7 days., Disp: 14 capsule, Rfl: 0   cycloSPORINE (RESTASIS) 0.05 % ophthalmic emulsion, Place 1 drop into both eyes as needed., Disp: , Rfl:    diltiazem  (CARDIZEM  CD) 120 MG 24 hr capsule, TAKE 1 CAPSULE(120 MG) BY MOUTH DAILY, Disp: 90 capsule, Rfl: 3   erythromycin  ophthalmic ointment, Place 1 Application into the left eye at bedtime., Disp: 3.5 g, Rfl: 0   fexofenadine (ALLEGRA) 180 MG tablet, Take 180 mg by mouth daily as needed (seasonal allergies)., Disp: , Rfl:    Fezolinetant  (VEOZAH ) 45 MG TABS, Take 1 tablet (45  mg total) by mouth daily., Disp: 90 tablet, Rfl: 3   Glucosamine HCl-MSM (GLUCOSAMINE-MSM PO), Take 1 tablet by mouth 2 (two) times daily., Disp: , Rfl:    metoprolol  tartrate (LOPRESSOR ) 25 MG tablet, Take 1 tablet (25 mg total) by mouth daily as needed., Disp: 90 tablet, Rfl: 1   Multiple Vitamins-Minerals (CITRACAL +D3 PO), Take 1 tablet by mouth in the morning and at bedtime., Disp: , Rfl:    Multiple Vitamins-Minerals (PRESERVISION AREDS PO), Take 1 tablet by mouth 2 (two) times daily. # 2, Disp: , Rfl:    Polyethyl Glycol-Propyl Glycol 0.4-0.3 % SOLN, Place 2 drops into both eyes daily., Disp: , Rfl:    prednisoLONE acetate (PRED FORTE) 1 % ophthalmic suspension, She is only using once daily, Disp: , Rfl:    sodium chloride  (OCEAN) 0.65 % nasal spray, Place 1 spray into the nose 2 (two) times daily as needed for congestion. Reported on 01/19/2016, Disp: , Rfl:    venlafaxine  XR (EFFEXOR  XR) 37.5 MG 24 hr capsule, Take 1 capsule (37.5 mg total) by mouth daily with breakfast., Disp: 90 capsule, Rfl: 3  Allergies  Allergen Reactions   Codeine Nausea Only and Other (See Comments)   Gabapentin  Other (See Comments)    Makes her very sleepy    Social History   Tobacco Use   Smoking status: Never   Smokeless tobacco: Never  Vaping Use   Vaping status: Never Used  Substance Use Topics   Alcohol use: Yes    Comment: wine once a month   Drug use: No     Objective:   Vitals:   10/05/24 1454  BP: 120/78  Pulse: 67  Resp: 16  Height: 5' 5.5 (1.664 m)  Weight: 165 lb (74.8 kg)  SpO2: 94%  BMI (Calculated): 27.03     Appears well, INAD Sclera anicteric Oral mucosa moist Normal resp effort and excursion BLE 1+ pitting edema. Small skin tear right lateral lower leg with serous ooze, mild tenderness, minimal surrounding erythema, and localized ecchymosis.

## 2024-10-05 NOTE — Patient Instructions (Signed)
  VISIT SUMMARY: Today, you were seen for a new skin tear on your lower leg with some drainage and mild redness. We also discussed your ongoing issues with leg swelling and varicose veins.  YOUR PLAN: SKIN TEAR WITH DRAINAGE AND MILD REDNESS: You have a new skin tear on your lower leg that is draining and slightly red, likely due to mild swelling and a minor injury. -Apply a Tegaderm dressing to the area to prevent further oozing. -If the Tegaderm dressing is not enough, consider using Allevyn (or some other brand of foam dressings) for better absorption. -You have been prescribed an antibiotic to cover for any potential infection. -Elevate your leg to help reduce the swelling. -Watch for increased redness, pain, fever, or streaking, and seek urgent care if these occur.  PITTING EDEMA OF LOWER EXTREMITIES: You have mild swelling in your lower legs, likely due to venous insufficiency and fluid buildup. -Continue wearing your compression stockings. -Try to minimize the time you spend on your feet to help reduce the swelling and drainage.

## 2024-10-05 NOTE — Telephone Encounter (Signed)
 Noted. Dm/cma

## 2024-10-07 DIAGNOSIS — R09A2 Foreign body sensation, throat: Secondary | ICD-10-CM | POA: Diagnosis not present

## 2024-10-07 DIAGNOSIS — E041 Nontoxic single thyroid nodule: Secondary | ICD-10-CM | POA: Diagnosis not present

## 2024-10-07 DIAGNOSIS — R0982 Postnasal drip: Secondary | ICD-10-CM | POA: Diagnosis not present

## 2024-10-07 DIAGNOSIS — K219 Gastro-esophageal reflux disease without esophagitis: Secondary | ICD-10-CM | POA: Diagnosis not present

## 2024-10-08 ENCOUNTER — Encounter: Payer: Self-pay | Admitting: Family Medicine

## 2024-10-08 DIAGNOSIS — K219 Gastro-esophageal reflux disease without esophagitis: Secondary | ICD-10-CM | POA: Insufficient documentation

## 2024-10-21 DIAGNOSIS — Z947 Corneal transplant status: Secondary | ICD-10-CM | POA: Diagnosis not present

## 2024-10-21 DIAGNOSIS — H18211 Corneal edema secondary to contact lens, right eye: Secondary | ICD-10-CM | POA: Diagnosis not present

## 2024-11-02 ENCOUNTER — Institutional Professional Consult (permissible substitution) (INDEPENDENT_AMBULATORY_CARE_PROVIDER_SITE_OTHER)

## 2024-11-06 ENCOUNTER — Emergency Department (HOSPITAL_BASED_OUTPATIENT_CLINIC_OR_DEPARTMENT_OTHER)
Admission: EM | Admit: 2024-11-06 | Discharge: 2024-11-06 | Disposition: A | Discharge status: Home / Self Care | Attending: Emergency Medicine | Admitting: Emergency Medicine

## 2024-11-06 ENCOUNTER — Emergency Department (HOSPITAL_BASED_OUTPATIENT_CLINIC_OR_DEPARTMENT_OTHER)

## 2024-11-06 ENCOUNTER — Encounter (HOSPITAL_BASED_OUTPATIENT_CLINIC_OR_DEPARTMENT_OTHER): Payer: Self-pay

## 2024-11-06 ENCOUNTER — Other Ambulatory Visit: Payer: Self-pay

## 2024-11-06 DIAGNOSIS — R918 Other nonspecific abnormal finding of lung field: Secondary | ICD-10-CM | POA: Diagnosis not present

## 2024-11-06 DIAGNOSIS — I4891 Unspecified atrial fibrillation: Secondary | ICD-10-CM | POA: Diagnosis not present

## 2024-11-06 DIAGNOSIS — R600 Localized edema: Secondary | ICD-10-CM | POA: Diagnosis not present

## 2024-11-06 DIAGNOSIS — N189 Chronic kidney disease, unspecified: Secondary | ICD-10-CM | POA: Diagnosis not present

## 2024-11-06 DIAGNOSIS — M7989 Other specified soft tissue disorders: Secondary | ICD-10-CM | POA: Diagnosis not present

## 2024-11-06 DIAGNOSIS — Z7901 Long term (current) use of anticoagulants: Secondary | ICD-10-CM | POA: Insufficient documentation

## 2024-11-06 LAB — CBC WITH DIFFERENTIAL/PLATELET
Abs Immature Granulocytes: 0.01 K/uL (ref 0.00–0.07)
Basophils Absolute: 0 K/uL (ref 0.0–0.1)
Basophils Relative: 1 %
Eosinophils Absolute: 0.2 K/uL (ref 0.0–0.5)
Eosinophils Relative: 4 %
HCT: 32.1 % — ABNORMAL LOW (ref 36.0–46.0)
Hemoglobin: 10.8 g/dL — ABNORMAL LOW (ref 12.0–15.0)
Immature Granulocytes: 0 %
Lymphocytes Relative: 32 %
Lymphs Abs: 1.5 K/uL (ref 0.7–4.0)
MCH: 32.6 pg (ref 26.0–34.0)
MCHC: 33.6 g/dL (ref 30.0–36.0)
MCV: 97 fL (ref 80.0–100.0)
Monocytes Absolute: 0.6 K/uL (ref 0.1–1.0)
Monocytes Relative: 12 %
Neutro Abs: 2.4 K/uL (ref 1.7–7.7)
Neutrophils Relative %: 51 %
Platelets: 187 K/uL (ref 150–400)
RBC: 3.31 MIL/uL — ABNORMAL LOW (ref 3.87–5.11)
RDW: 14.6 % (ref 11.5–15.5)
WBC: 4.8 K/uL (ref 4.0–10.5)
nRBC: 0 % (ref 0.0–0.2)

## 2024-11-06 LAB — COMPREHENSIVE METABOLIC PANEL WITH GFR
ALT: 17 U/L (ref 0–44)
AST: 24 U/L (ref 15–41)
Albumin: 4.1 g/dL (ref 3.5–5.0)
Alkaline Phosphatase: 86 U/L (ref 38–126)
Anion gap: 8 (ref 5–15)
BUN: 26 mg/dL — ABNORMAL HIGH (ref 8–23)
CO2: 27 mmol/L (ref 22–32)
Calcium: 9 mg/dL (ref 8.9–10.3)
Chloride: 104 mmol/L (ref 98–111)
Creatinine, Ser: 1.27 mg/dL — ABNORMAL HIGH (ref 0.44–1.00)
GFR, Estimated: 40 mL/min — ABNORMAL LOW (ref >60)
Glucose, Bld: 110 mg/dL — ABNORMAL HIGH (ref 70–99)
Potassium: 4.1 mmol/L (ref 3.5–5.1)
Sodium: 139 mmol/L (ref 135–145)
Total Bilirubin: 0.2 mg/dL (ref 0.0–1.2)
Total Protein: 6.3 g/dL — ABNORMAL LOW (ref 6.5–8.1)

## 2024-11-06 LAB — PRO BRAIN NATRIURETIC PEPTIDE: Pro Brain Natriuretic Peptide: 309 pg/mL — ABNORMAL HIGH (ref <300.0)

## 2024-11-06 NOTE — Discharge Instructions (Addendum)
 You were seen in the emergency department for your leg swelling and weeping.  This is likely related to your venous insufficiency.  You should continue to wear your compression stockings to help with the swelling and this should help with the leaking.  Your labs otherwise showed no other complications.  You can follow-up with your primary doctor for further management.  You should return to the emergency department if you have redness to your leg that is hot to touch, you have fevers, shortness of breath or any other new or concerning symptoms.

## 2024-11-06 NOTE — ED Triage Notes (Signed)
 Hit right lower leg on dishwasher a few days ago. Reports clear drainage coming out of wound. Pt is on eliquis . Bilateral leg swelling. Denies being on fluid pills at home.

## 2024-11-06 NOTE — ED Provider Notes (Signed)
 [facility] Provider Note   CSN: [id number] Arrival date & time: 11/06/24  2101     Patient presents with: Leg Swelling   Katherine Powell is a 89 y.o. female.   Patient is a 89 year old female with a past medical history of A-fib on Eliquis  and CKD presenting to the emergency department with lower extremity swelling.  The patient states that she hit her leg on the dishwasher door a few days ago and had a skin tear.  She states she initially had significant bleeding but was able to control this and has had a dressing on since.  She states that since last night she started to notice her legs were weeping clear fluid and that they did seem swollen.  She states that she does wear compression stockings normally but has not been wearing them the last few days.  She states that she had not noticed swelling in her legs previous to this.  She denied any associated chest pain or shortness of breath.  Denies any previous medication changes.  The history is provided by the patient and the spouse.       Prior to Admission medications   Medication Sig Start Date End Date Taking? Authorizing Provider  acetaminophen  (TYLENOL ) 650 MG CR tablet Take 650 mg by mouth as needed for pain. Reported on 01/19/2016    [provider]  ALPRAZolam  (XANAX ) 0.5 MG tablet TAKE 1/2 TABLET(0.25 MG) BY MOUTH TWICE DAILY AS NEEDED FOR ANXIETY 09/25/24   Webb, Padonda B, FNP  amiodarone  (PACERONE ) 200 MG tablet Take 1 tablet (200 mg total) by mouth daily. 06/01/24   Terra Fairy PARAS, PA-C  anastrozole  (ARIMIDEX ) 1 MG tablet Take 1 mg by mouth daily.    [provider]  apixaban  (ELIQUIS ) 2.5 MG TABS tablet Take 1 tablet (2.5 mg total) by mouth 2 (two) times daily. 08/28/24   Thedora Garnette HERO, MD  b complex vitamins capsule Take 1 capsule by mouth daily.    [provider]  BIOTIN PO Take 1 tablet by mouth 2 (two) times daily. For Hair, Skin and Nails     [provider]  carboxymethylcellulose (REFRESH PLUS) 0.5 % SOLN Place 1 drop into both eyes as needed.    [provider]  cycloSPORINE (RESTASIS) 0.05 % ophthalmic emulsion Place 1 drop into both eyes as needed.    [provider]  diltiazem  (CARDIZEM  CD) 120 MG 24 hr capsule TAKE 1 CAPSULE(120 MG) BY MOUTH DAILY 05/04/24   Terra Fairy PARAS, PA-C  erythromycin  ophthalmic ointment Place 1 Application into the left eye at bedtime. 08/28/24   Thedora Garnette HERO, MD  fexofenadine (ALLEGRA) 180 MG tablet Take 180 mg by mouth daily as needed (seasonal allergies).    [provider]  Fezolinetant  (VEOZAH ) 45 MG TABS Take 1 tablet (45 mg total) by mouth daily. 07/30/24   Thedora Garnette HERO, MD  Glucosamine HCl-MSM (GLUCOSAMINE-MSM PO) Take 1 tablet by mouth 2 (two) times daily.    [provider]  metoprolol  tartrate (LOPRESSOR ) 25 MG tablet Take 1 tablet (25 mg total) by mouth daily as needed. 02/13/23   Lelon Hamilton T, PA-C  Multiple Vitamins-Minerals (CITRACAL +D3 PO) Take 1 tablet by mouth in the morning and at bedtime.    [provider]  Multiple Vitamins-Minerals (PRESERVISION AREDS PO) Take 1 tablet by mouth 2 (two) times daily. # 2    [provider]  Polyethyl Glycol-Propyl Glycol 0.4-0.3 %  SOLN Place 2 drops into both eyes daily.    [provider]  prednisoLONE acetate (PRED FORTE) 1 % ophthalmic suspension She is only using once daily 10/29/23   [provider]  sodium chloride  (OCEAN) 0.65 % nasal spray Place 1 spray into the nose 2 (two) times daily as needed for congestion. Reported on 01/19/2016    [provider]  venlafaxine  XR (EFFEXOR  XR) 37.5 MG 24 hr capsule Take 1 capsule (37.5 mg total) by mouth daily with breakfast. 02/14/24   Thedora Garnette HERO, MD    Allergies: Codeine and Gabapentin     Review of Systems  Updated Vital Signs BP (!) 140/62   Pulse 60   Temp 97.7 F (36.5 C) (Oral)   Resp 18    Ht 5' 6 (1.676 m)   Wt 73.9 kg   SpO2 99%   BMI 26.31 kg/m   Physical Exam Vitals and nursing note reviewed.  Constitutional:      General: She is not in acute distress.    Appearance: Normal appearance.  HENT:     Head: Normocephalic and atraumatic.     Nose: Nose normal.     Mouth/Throat:     Mouth: Mucous membranes are moist.  Eyes:     Extraocular Movements: Extraocular movements intact.     Conjunctiva/sclera: Conjunctivae normal.  Cardiovascular:     Rate and Rhythm: Normal rate and regular rhythm.     Heart sounds: Normal heart sounds.  Pulmonary:     Effort: Pulmonary effort is normal.     Breath sounds: Normal breath sounds.  Abdominal:     General: Abdomen is flat.     Palpations: Abdomen is soft.     Tenderness: There is no abdominal tenderness.  Musculoskeletal:        General: Normal range of motion.     Cervical back: Normal range of motion.     Right lower leg: Edema (2+ to the knee) present.     Left lower leg: Edema (2+ to knee) present.  Skin:    General: Skin is warm and dry.     Comments: Healing skin tear on right anterior shin without any surrounding erythema, warmth or purulence  Neurological:     General: No focal deficit present.     Mental Status: She is alert and oriented to person, place, and time.  Psychiatric:        Mood and Affect: Mood normal.        Behavior: Behavior normal.     (all labs ordered are listed, but only abnormal results are displayed) Labs Reviewed  CBC WITH DIFFERENTIAL/PLATELET - Abnormal; Notable for the following components:      Result Value   RBC 3.31 (*)    Hemoglobin 10.8 (*)    HCT 32.1 (*)    All other components within normal limits  PRO BRAIN NATRIURETIC PEPTIDE  COMPREHENSIVE METABOLIC PANEL WITH GFR    EKG: None  Radiology: DG Chest Port 1 View Result Date: 11/06/2024 EXAM: 1 VIEW(S) XRAY OF THE CHEST 11/06/2024 10:00:00 PM COMPARISON: Chest x-ray 11/11/2023. CLINICAL HISTORY: LE swelling.  FINDINGS: LUNGS AND PLEURA: Linear scarring or atelectasis in left upper lobe and at the right base. No pleural effusion. No pneumothorax. HEART AND MEDIASTINUM: No acute abnormality of the cardiac and mediastinal silhouettes. BONES AND SOFT TISSUES: Thoracic dextroscoliosis. IMPRESSION: 1. Linear scarring or atelectasis in the left upper lobe and at the right base. Electronically signed by: Morgane Naveau MD 11/06/2024  10:14 PM EST RP Workstation: [id number]     Procedures   Medications Ordered in the ED - No data to display  Clinical Course as of 11/06/24 2334  Fri Nov 06, 2024  2330 Cr at baseline, mildly low protein but normal albumin. Mildly elevated BNP without cardiomegaly or other findings concerning for CHF. PCP records had previous findings of LE edema thought to be due to venous insufficiency. She is stable for discharge home with outpatient follow up. [VK]    Clinical Course User Index [VK] Kingsley, Melquan Ernsberger K, DO                                 Medical Decision Making This patient presents to the ED with chief complaint(s) of lower extremity swelling with pertinent past medical history of A-fib, CKD which further complicates the presenting complaint. The complaint involves an extensive differential diagnosis and also carries with it a high risk of complications and morbidity.    The differential diagnosis includes CHF, hepatic dysfunction, renal dysfunction, venous insufficiency, no evidence of cellulitis, equal swelling bilaterally and compliant on her thinner making a DVT unlikely  Additional history obtained: Additional history obtained from spouse Records reviewed Primary Care Documents  ED Course and Reassessment: On arrival patient is hemodynamically stable in no acute distress.  No signs of infection on her legs.  Will have labs and chest x-ray to evaluate for etiology of her swelling and will be closely reassessed.  Independent labs interpretation:  The following  labs were independently interpreted: slightly decreased Hgb from baseline, remainder of labs pending  Independent visualization of imaging: - I independently visualized the following imaging with scope of interpretation limited to determining acute life threatening conditions related to emergency care: CXR, which revealed no acute disease   Amount and/or Complexity of Data Reviewed Labs: ordered. Radiology: ordered.       Final diagnoses:  Peripheral edema    ED Discharge Orders     None          Ellouise Richerd POUR, DO 11/06/24 2322

## 2024-11-10 ENCOUNTER — Other Ambulatory Visit (HOSPITAL_COMMUNITY): Payer: Self-pay | Admitting: Internal Medicine

## 2024-11-17 DIAGNOSIS — H35033 Hypertensive retinopathy, bilateral: Secondary | ICD-10-CM | POA: Diagnosis not present

## 2024-11-17 DIAGNOSIS — H353133 Nonexudative age-related macular degeneration, bilateral, advanced atrophic without subfoveal involvement: Secondary | ICD-10-CM | POA: Diagnosis not present

## 2024-11-17 DIAGNOSIS — H353123 Nonexudative age-related macular degeneration, left eye, advanced atrophic without subfoveal involvement: Secondary | ICD-10-CM | POA: Diagnosis not present

## 2024-11-17 DIAGNOSIS — Z961 Presence of intraocular lens: Secondary | ICD-10-CM | POA: Diagnosis not present

## 2024-11-17 DIAGNOSIS — H1789 Other corneal scars and opacities: Secondary | ICD-10-CM | POA: Diagnosis not present

## 2024-11-17 DIAGNOSIS — H43813 Vitreous degeneration, bilateral: Secondary | ICD-10-CM | POA: Diagnosis not present

## 2025-01-18 ENCOUNTER — Ambulatory Visit (HOSPITAL_COMMUNITY): Admitting: Internal Medicine

## 2025-02-25 ENCOUNTER — Ambulatory Visit: Admitting: Family Medicine

## 2025-06-11 ENCOUNTER — Ambulatory Visit
